# Patient Record
Sex: Male | Born: 1960 | Race: White | Hispanic: No | Marital: Married | Smoking: Never smoker
Health system: Southern US, Community
[De-identification: ages and names within clinical notes are randomized; demographics above are authoritative.]

## PROBLEM LIST (undated history)

## (undated) DIAGNOSIS — E669 Obesity, unspecified: Secondary | ICD-10-CM

## (undated) DIAGNOSIS — R079 Chest pain, unspecified: Secondary | ICD-10-CM

## (undated) DIAGNOSIS — E119 Type 2 diabetes mellitus without complications: Secondary | ICD-10-CM

## (undated) DIAGNOSIS — I10 Essential (primary) hypertension: Secondary | ICD-10-CM

## (undated) HISTORY — PX: EYE SURGERY: SHX253

## (undated) HISTORY — DX: Chest pain, unspecified: R07.9

## (undated) HISTORY — PX: TONSILLECTOMY: SUR1361

---

## 1998-12-12 ENCOUNTER — Encounter: Admission: RE | Admit: 1998-12-12 | Discharge: 1999-03-12 | Payer: Self-pay | Admitting: Emergency Medicine

## 2013-09-26 ENCOUNTER — Emergency Department (HOSPITAL_COMMUNITY): Payer: BC Managed Care – PPO

## 2013-09-26 ENCOUNTER — Emergency Department (HOSPITAL_COMMUNITY)
Admission: EM | Admit: 2013-09-26 | Discharge: 2013-09-27 | Disposition: A | Discharge status: Home / Self Care | Payer: BC Managed Care – PPO | Attending: Emergency Medicine | Admitting: Emergency Medicine

## 2013-09-26 ENCOUNTER — Encounter (HOSPITAL_COMMUNITY): Payer: Self-pay | Admitting: Emergency Medicine

## 2013-09-26 DIAGNOSIS — E669 Obesity, unspecified: Secondary | ICD-10-CM | POA: Insufficient documentation

## 2013-09-26 DIAGNOSIS — M549 Dorsalgia, unspecified: Secondary | ICD-10-CM | POA: Insufficient documentation

## 2013-09-26 DIAGNOSIS — I10 Essential (primary) hypertension: Secondary | ICD-10-CM

## 2013-09-26 DIAGNOSIS — Z79899 Other long term (current) drug therapy: Secondary | ICD-10-CM | POA: Insufficient documentation

## 2013-09-26 DIAGNOSIS — Z7982 Long term (current) use of aspirin: Secondary | ICD-10-CM | POA: Insufficient documentation

## 2013-09-26 HISTORY — DX: Obesity, unspecified: E66.9

## 2013-09-26 HISTORY — DX: Essential (primary) hypertension: I10

## 2013-09-26 LAB — BASIC METABOLIC PANEL
BUN: 9 mg/dL (ref 6–23)
CALCIUM: 9.4 mg/dL (ref 8.4–10.5)
CO2: 26 mEq/L (ref 19–32)
Chloride: 99 mEq/L (ref 96–112)
Creatinine, Ser: 0.52 mg/dL (ref 0.50–1.35)
GFR calc Af Amer: >90 mL/min (ref >90)
GFR calc non Af Amer: >90 mL/min (ref >90)
GLUCOSE: 155 mg/dL — AB (ref 70–99)
Potassium: 4 mEq/L (ref 3.7–5.3)
Sodium: 143 mEq/L (ref 137–147)

## 2013-09-26 LAB — I-STAT TROPONIN, ED
TROPONIN I, POC: 0 ng/mL (ref 0.00–0.08)
Troponin i, poc: 0 ng/mL (ref 0.00–0.08)

## 2013-09-26 LAB — CBC
HCT: 45 % (ref 39.0–52.0)
HEMOGLOBIN: 15.8 g/dL (ref 13.0–17.0)
MCH: 28.9 pg (ref 26.0–34.0)
MCHC: 35.1 g/dL (ref 30.0–36.0)
MCV: 82.4 fL (ref 78.0–100.0)
Platelets: 274 10*3/uL (ref 150–400)
RBC: 5.46 MIL/uL (ref 4.22–5.81)
RDW: 13.2 % (ref 11.5–15.5)
WBC: 11.4 10*3/uL — ABNORMAL HIGH (ref 4.0–10.5)

## 2013-09-26 LAB — URINALYSIS, ROUTINE W REFLEX MICROSCOPIC
Bilirubin Urine: NEGATIVE
Glucose, UA: NEGATIVE mg/dL
Hgb urine dipstick: NEGATIVE
Ketones, ur: NEGATIVE mg/dL
LEUKOCYTES UA: NEGATIVE
Nitrite: NEGATIVE
PROTEIN: NEGATIVE mg/dL
Specific Gravity, Urine: 1.011 (ref 1.005–1.030)
UROBILINOGEN UA: 0.2 mg/dL (ref 0.0–1.0)
pH: 7 (ref 5.0–8.0)

## 2013-09-26 LAB — PRO B NATRIURETIC PEPTIDE: Pro B Natriuretic peptide (BNP): 97.2 pg/mL (ref 0–125)

## 2013-09-26 LAB — D-DIMER, QUANTITATIVE (NOT AT ARMC): D-Dimer, Quant: <0.27 ug/mL-FEU (ref 0.00–0.48)

## 2013-09-26 MED ORDER — IOHEXOL 300 MG/ML  SOLN
20.0000 mL | INTRAMUSCULAR | Status: AC
  Administered 2013-09-26: 20 mL via ORAL

## 2013-09-26 MED ORDER — LABETALOL HCL 5 MG/ML IV SOLN
10.0000 mg | Freq: Once | INTRAVENOUS | Status: AC
  Administered 2013-09-26: 10 mg via INTRAVENOUS
  Filled 2013-09-26: qty 4

## 2013-09-26 MED ORDER — SODIUM CHLORIDE 0.9 % IV BOLUS (SEPSIS)
500.0000 mL | Freq: Once | INTRAVENOUS | Status: AC
  Administered 2013-09-26: 500 mL via INTRAVENOUS

## 2013-09-26 MED ORDER — IOHEXOL 350 MG/ML SOLN
100.0000 mL | Freq: Once | INTRAVENOUS | Status: AC | PRN
  Administered 2013-09-26: 100 mL via INTRAVENOUS

## 2013-09-26 NOTE — ED Notes (Addendum)
Pt reports sob that has been getting progressively worse x 2 days, and is more severe x past 2 days. Also has a pain in mid back, similar to his pleurisy in past. Denies swelling to extremities or cough. No resp distress noted, ekg done at triage. Hypertensive at triage, bp 209/88.

## 2013-09-26 NOTE — ED Provider Notes (Signed)
CSN: [id number]     Arrival date & time 09/26/13  1843 History   First MD Initiated Contact with Patient 09/26/13 1919     Chief Complaint  Patient presents with  . Shortness of Breath  . Back Pain     (Consider location/radiation/quality/duration/timing/severity/associated sxs/prior Treatment) HPI Comments: 53 year old male with history of high blood pressure recently be started on lisinopril, works in [employer] presents with general fatigue and bilateral back discomfort has a mild ache for the past few days. Patient denies any known cardiac history or other significant cardiac risk factors.Patient denies blood clot history, active cancer, recent major trauma or surgery, unilateral leg swelling/ pain, recent long travel, hemoptysis or oral contraceptives. Patient denies injuries or significant leg swelling. The mild ache at times is worse with a deep breath however is just constant. No fevers or chills. Patient is not bleeding from the rectum. No other new medications. Patient denies significant shortness of breath. Back pain mild ache bilateral lower ribs mild worsening with movements however no specific was impacted. Patient has had a few brief episodes of right flank pain the past 2 or 3 days however no pain currently. No urinary symptoms.  Patient is a 53 y.o. male presenting with shortness of breath and back pain. The history is provided by the patient.  Shortness of Breath Associated symptoms: no abdominal pain, no chest pain, no fever, no headaches, no neck pain, no rash and no vomiting   Back Pain Associated symptoms: no abdominal pain, no chest pain, no dysuria, no fever, no headaches, no numbness and no weakness     Past Medical History  Diagnosis Date  . Obesity   . Hypertension    History reviewed. No pertinent past surgical history. History reviewed. No pertinent family history. History  Substance Use Topics  . Smoking status: Not on file  . Smokeless  tobacco: Not on file  . Alcohol Use: No    Review of Systems  Constitutional: Positive for fatigue. Negative for fever and chills.  HENT: Negative for congestion.   Eyes: Negative for visual disturbance.  Respiratory: Negative for shortness of breath.   Cardiovascular: Negative for chest pain.  Gastrointestinal: Negative for vomiting and abdominal pain.  Genitourinary: Negative for dysuria and flank pain.  Musculoskeletal: Positive for back pain. Negative for neck pain and neck stiffness.  Skin: Negative for rash.  Neurological: Negative for weakness, light-headedness, numbness and headaches.      Allergies  Review of patient's allergies indicates no known allergies.  Home Medications   Prior to Admission medications   Medication Sig Start Date End Date Taking? Authorizing Provider  aspirin EC 81 MG tablet Take 81 mg by mouth daily.   Yes Historical Provider, MD  b complex vitamins tablet Take 1 tablet by mouth daily.   Yes Historical Provider, MD  CINNAMON PO Take 1 capsule by mouth daily.   Yes Historical Provider, MD  Coenzyme Q10 (COQ10) 100 MG CAPS Take 100 mg by mouth daily.   Yes Historical Provider, MD  lisinopril (PRINIVIL,ZESTRIL) 20 MG tablet Take 20 mg by mouth daily.   Yes Historical Provider, MD  Omega-3 Fatty Acids (FISH OIL) 1200 MG CAPS Take 2,400 mg by mouth daily.    Yes Historical Provider, MD  Red Yeast Rice 600 MG CAPS Take 600 mg by mouth daily.   Yes Historical Provider, MD   BP 175/84  Pulse 70  Temp(Src) 98.1 F (36.7 C) (Oral)  Resp 20  SpO2 97%  Physical Exam  Nursing note and vitals reviewed. Constitutional: He is oriented to person, place, and time. He appears well-developed and well-nourished.  HENT:  Head: Normocephalic and atraumatic.  Eyes: Conjunctivae are normal. Right eye exhibits no discharge. Left eye exhibits no discharge.  Neck: Normal range of motion. Neck supple. No tracheal deviation present.  Cardiovascular: Normal rate and  regular rhythm.   Pulmonary/Chest: Effort normal and breath sounds normal.  Abdominal: Soft. He exhibits no distension. There is no tenderness. There is no guarding.  Musculoskeletal: He exhibits no edema.  Neurological: He is alert and oriented to person, place, and time. No cranial nerve deficit or sensory deficit. He exhibits normal muscle tone. Coordination normal. GCS eye subscore is 4. GCS verbal subscore is 5. GCS motor subscore is 6.  Skin: Skin is warm. No rash noted.  Psychiatric: He has a normal mood and affect.    ED Course  Procedures (including critical care time) Emergency Focused Ultrasound Exam Limited retroperitoneal ultrasound of kidneys  Performed and interpreted by Dr. Reather Converse Indication: flank pain Focused abdominal ultrasound with both kidneys imaged in transverse and longitudinal planes in real-time. Interpretation: No hydronephrosis visualized.   Images archived electronically   [place] "Study: Limited Abdominal Ultrasound of the gallbladder and common bile duct."  INDICATIONS: RUQ pain and Back pain Indication: Multiple views of the gallbladder and common bile duct were obtained in real-time with a Multi-frequency probe." PERFORMED BY:  Myself IMAGES ARCHIVED?: Yes FINDINGS: Gallstones absent, Gallbladder wall normal in thickness and Sonographic Murphy's sign absent LIMITATIONS: Body Habitus and Bowel Gas INTERPRETATION: Normal   Labs Review Labs Reviewed  CBC - Abnormal; Notable for the following:    WBC 11.4 (*)    All other components within normal limits  BASIC METABOLIC PANEL - Abnormal; Notable for the following:    Glucose, Bld 155 (*)    All other components within normal limits  PRO B NATRIURETIC PEPTIDE  D-DIMER, QUANTITATIVE  URINALYSIS, ROUTINE W REFLEX MICROSCOPIC  I-STAT TROPOININ, ED    Imaging Review Dg Chest 2 View  09/26/2013   CLINICAL DATA:  Right lower quadrant pain for 5 days.  Pleuritic chest pain. Pleurisy. Short of breath. Back pain.  EXAM: CHEST  2 VIEW  COMPARISON:  None.  FINDINGS: Cardiopericardial silhouette within normal limits. Mediastinal contours normal. Trachea midline. No airspace disease or effusion.  IMPRESSION: No active cardiopulmonary disease.   Electronically Signed   By: Dereck Ligas M.D.   On: 09/26/2013 20:22   Ct Angio Chest Aortic Dissect W &/or W/o  09/26/2013   CLINICAL DATA:  Short of breath. Progressive worsening over the last 2 days. Mid back pain. Pleurisy.  EXAM: CT ANGIOGRAPHY CHEST, ABDOMEN AND PELVIS  TECHNIQUE: Multidetector CT imaging through the chest, abdomen and pelvis was performed using the standard protocol during bolus administration of intravenous contrast. Multiplanar reconstructed images and MIPs were obtained and reviewed to evaluate the vascular anatomy.  CONTRAST:  116mL OMNIPAQUE IOHEXOL 350 MG/ML SOLN  COMPARISON:  None.  FINDINGS: CTA CHEST FINDINGS  Noncontrast CT of the chest shows minimal calcification at the ligamentum arteriosum. No intramural hematomas present. Negative for pulmonary embolism or acute aortic abnormality in the chest. Heart appears normal. No effusion. No adenopathy. Central airways are patent. Calcification is present in the left posterior chest wall, compatible with a calcified dermal lesion, likely sebaceous cyst the. No aggressive osseous lesions. Thoracic spine DISH. Lungs are clear. No airspace disease.  Review of the  MIP images confirms the above findings.  CTA ABDOMEN AND PELVIS FINDINGS  Aortoiliac system is within normal limits. Small and large bowel appears within normal limits. Celiac axis, superior and inferior mesenteric arteries appear widely patent. Renal arteries appear normal. There are 3 renal arteries each with separate origins from the aorta on the right. Left renal artery is simplex. Adrenal glands within normal limits. Pancreas and common bile duct within normal limits. Left upper poles  simple renal cysts. Probable small right interpolar renal cyst. Normal appendix. Urinary bladder appears normal. Prostatomegaly with 7.5 transverse prostate span. Mild bilateral hip osteoarthritis. L5-S1 predominant lumbar spondylosis.  Review of the MIP images confirms the above findings.  IMPRESSION: Negative CTA chest abdomen and pelvis. Incidental note made of left renal cysts.   Electronically Signed   By: Dereck Ligas M.D.   On: 09/26/2013 23:02   Ct Cta Abd/pel W/cm &/or W/o Cm  09/26/2013   CLINICAL DATA:  Short of breath. Progressive worsening over the last 2 days. Mid back pain. Pleurisy.  EXAM: CT ANGIOGRAPHY CHEST, ABDOMEN AND PELVIS  TECHNIQUE: Multidetector CT imaging through the chest, abdomen and pelvis was performed using the standard protocol during bolus administration of intravenous contrast. Multiplanar reconstructed images and MIPs were obtained and reviewed to evaluate the vascular anatomy.  CONTRAST:  11mL OMNIPAQUE IOHEXOL 350 MG/ML SOLN  COMPARISON:  None.  FINDINGS: CTA CHEST FINDINGS  Noncontrast CT of the chest shows minimal calcification at the ligamentum arteriosum. No intramural hematomas present. Negative for pulmonary embolism or acute aortic abnormality in the chest. Heart appears normal. No effusion. No adenopathy. Central airways are patent. Calcification is present in the left posterior chest wall, compatible with a calcified dermal lesion, likely sebaceous cyst the. No aggressive osseous lesions. Thoracic spine DISH. Lungs are clear. No airspace disease.  Review of the MIP images confirms the above findings.  CTA ABDOMEN AND PELVIS FINDINGS  Aortoiliac system is within normal limits. Small and large bowel appears within normal limits. Celiac axis, superior and inferior mesenteric arteries appear widely patent. Renal arteries appear normal. There are 3 renal arteries each with separate origins from the aorta on the right. Left renal artery is simplex. Adrenal glands  within normal limits. Pancreas and common bile duct within normal limits. Left upper poles simple renal cysts. Probable small right interpolar renal cyst. Normal appendix. Urinary bladder appears normal. Prostatomegaly with 7.5 transverse prostate span. Mild bilateral hip osteoarthritis. L5-S1 predominant lumbar spondylosis.  Review of the MIP images confirms the above findings.  IMPRESSION: Negative CTA chest abdomen and pelvis. Incidental note made of left renal cysts.   Electronically Signed   By: Dereck Ligas M.D.   On: 09/26/2013 23:02     EKG Interpretation   Date/Time:  Sunday September 26 2013 18:48:42 EDT Ventricular Rate:  89 PR Interval:  146 QRS Duration: 108 QT Interval:  372 QTC Calculation: 452 R Axis:   123 Text Interpretation:  Normal sinus rhythm with sinus arrhythmia Incomplete  right bundle branch block Left posterior fascicular block Abnormal ECG  Confirmed by Britaney Espaillat  MD, Gizzelle Lacomb ([id number]) on 09/26/2013 9:22:29 PM      MDM   Final diagnoses:  HTN (hypertension)  Back pain  RBBB  Patient presents with nonspecific symptoms, nonfocal and primary concern is general fatigue with mild back ache. He has no focal tenosynovitis and no midline tenderness, no fevers and normal neurologic exam. Discussed broad differential and plan for screening for cardiac, blood clots, urine infection, kidney  stone and basic labs. Blood pressure improved without significant treatment, labetalol IV ordered. If everything is unremarkable plantar close followup with primary doctor as patient currently is no chest pain or shortness of breath.  With back pain and uncontrolled blood pressure and no injuries reproducibility on exam plantar CT angiogram of dissection. CT done in no acute dissection or acute findings. Patient improved and vitals improved in ER. Small vessel available given. Patient will follow closely with his doctor for blood pressure management. Results and differential diagnosis were  discussed with the patient/parent/guardian. Close follow up outpatient was discussed, comfortable with the plan.   Medications  iohexol (OMNIPAQUE) 300 MG/ML solution 20 mL (20 mLs Oral Contrast Given 09/26/13 1956)  labetalol (NORMODYNE,TRANDATE) injection 10 mg (not administered)  sodium chloride 0.9 % bolus 500 mL (500 mLs Intravenous New Bag/Given 09/26/13 1949)    Filed Vitals:   09/26/13 1950 09/26/13 2141 09/26/13 2145 09/26/13 2200  BP: 175/84 204/98 180/95 156/81  Pulse: 70 81 79 76  Temp:  98.6 F (37 C)    TempSrc:  Oral    Resp: 20 16 21 18   SpO2: 97% 97% 97% 96%         Mariea Clonts, MD 09/26/13 2328

## 2013-09-27 NOTE — ED Notes (Signed)
Pt ambulating independently w/ steady gait on d/c in no acute distress, A&Ox4.D/c instructions reviewed w/ pt and family - pt and family deny any further questions or concerns at present.  

## 2013-09-27 NOTE — Discharge Instructions (Signed)
If you were given medicines take as directed.  If you are on coumadin or contraceptives realize their levels and effectiveness is altered by many different medicines.  If you have any reaction (rash, tongues swelling, other) to the medicines stop taking and see a physician.   Please follow up as directed and return to the ER or see a physician for new or worsening symptoms.  Thank you. Filed Vitals:   09/26/13 1950 09/26/13 2141 09/26/13 2145 09/26/13 2200  BP: 175/84 204/98 180/95 156/81  Pulse: 70 81 79 76  Temp:  98.6 F (37 C)    TempSrc:  Oral    Resp: 20 16 21 18   SpO2: 97% 97% 97% 96%    Arterial Hypertension Arterial hypertension (high blood pressure) is a condition of elevated pressure in your blood vessels. Hypertension over a long period of time is a risk factor for strokes, heart attacks, and heart failure. It is also the leading cause of kidney (renal) failure.  CAUSES   In Adults -- Over 90% of all hypertension has no known cause. This is called essential or primary hypertension. In the other 10% of people with hypertension, the increase in blood pressure is caused by another disorder. This is called secondary hypertension. Important causes of secondary hypertension are:  Heavy alcohol use.  Obstructive sleep apnea.  Hyperaldosterosim (Conn's syndrome).  Steroid use.  Chronic kidney failure.  Hyperparathyroidism.  Medications.  Renal artery stenosis.  Pheochromocytoma.  Cushing's disease.  Coarctation of the aorta.  Scleroderma renal crisis.  Licorice (in excessive amounts).  Drugs (cocaine, methamphetamine). Your caregiver can explain any items above that apply to you.  In Children -- Secondary hypertension is more common and should always be considered.  Pregnancy -- Few women of childbearing age have high blood pressure. However, up to 10% of them develop hypertension of pregnancy. Generally, this will not harm the woman. It may be a sign of 3  complications of pregnancy: preeclampsia, HELLP syndrome, and eclampsia. Follow up and control with medication is necessary. SYMPTOMS   This condition normally does not produce any noticeable symptoms. It is usually found during a routine exam.  Malignant hypertension is a late problem of high blood pressure. It may have the following symptoms:  Headaches.  Blurred vision.  End-organ damage (this means your kidneys, heart, lungs, and other organs are being damaged).  Stressful situations can increase the blood pressure. If a person with normal blood pressure has their blood pressure go up while being seen by their caregiver, this is often termed "white coat hypertension." Its importance is not known. It may be related with eventually developing hypertension or complications of hypertension.  Hypertension is often confused with mental tension, stress, and anxiety. DIAGNOSIS  The diagnosis is made by 3 separate blood pressure measurements. They are taken at least 1 week apart from each other. If there is organ damage from hypertension, the diagnosis may be made without repeat measurements. Hypertension is usually identified by having blood pressure readings:  Above 140/90 mmHg measured in both arms, at 3 separate times, over a couple weeks.  Over 130/80 mmHg should be considered a risk factor and may require treatment in patients with diabetes. Blood pressure readings over 120/80 mmHg are called "pre-hypertension" even in non-diabetic patients. To get a true blood pressure measurement, use the following guidelines. Be aware of the factors that can alter blood pressure readings.  Take measurements at least 1 hour after caffeine.  Take measurements 30 minutes after smoking  and without any stress. This is another reason to quit smoking  it raises your blood pressure.  Use a proper cuff size. Ask your caregiver if you are not sure about your cuff size.  Most home blood pressure cuffs are  automatic. They will measure systolic and diastolic pressures. The systolic pressure is the pressure reading at the start of sounds. Diastolic pressure is the pressure at which the sounds disappear. If you are elderly, measure pressures in multiple postures. Try sitting, lying or standing.  Sit at rest for a minimum of 5 minutes before taking measurements.  You should not be on any medications like decongestants. These are found in many cold medications.  Record your blood pressure readings and review them with your caregiver. If you have hypertension:  Your caregiver may do tests to be sure you do not have secondary hypertension (see "causes" above).  Your caregiver may also look for signs of metabolic syndrome. This is also called Syndrome X or Insulin Resistance Syndrome. You may have this syndrome if you have type 2 diabetes, abdominal obesity, and abnormal blood lipids in addition to hypertension.  Your caregiver will take your medical and family history and perform a physical exam.  Diagnostic tests may include blood tests (for glucose, cholesterol, potassium, and kidney function), a urinalysis, or an EKG. Other tests may also be necessary depending on your condition. PREVENTION  There are important lifestyle issues that you can adopt to reduce your chance of developing hypertension:  Maintain a normal weight.  Limit the amount of salt (sodium) in your diet.  Exercise often.  Limit alcohol intake.  Get enough potassium in your diet. Discuss specific advice with your caregiver.  Follow a DASH diet (dietary approaches to stop hypertension). This diet is rich in fruits, vegetables, and low-fat dairy products, and avoids certain fats. PROGNOSIS  Essential hypertension cannot be cured. Lifestyle changes and medical treatment can lower blood pressure and reduce complications. The prognosis of secondary hypertension depends on the underlying cause. Many people whose hypertension is  controlled with medicine or lifestyle changes can live a normal, healthy life.  RISKS AND COMPLICATIONS  While high blood pressure alone is not an illness, it often requires treatment due to its short- and long-term effects on many organs. Hypertension increases your risk for:  CVAs or strokes (cerebrovascular accident).  Heart failure due to chronically high blood pressure (hypertensive cardiomyopathy).  Heart attack (myocardial infarction).  Damage to the retina (hypertensive retinopathy).  Kidney failure (hypertensive nephropathy). Your caregiver can explain list items above that apply to you. Treatment of hypertension can significantly reduce the risk of complications. TREATMENT   For overweight patients, weight loss and regular exercise are recommended. Physical fitness lowers blood pressure.  Mild hypertension is usually treated with diet and exercise. A diet rich in fruits and vegetables, fat-free dairy products, and foods low in fat and salt (sodium) can help lower blood pressure. Decreasing salt intake decreases blood pressure in a 1/3 of people.  Stop smoking if you are a smoker. The steps above are highly effective in reducing blood pressure. While these actions are easy to suggest, they are difficult to achieve. Most patients with moderate or severe hypertension end up requiring medications to bring their blood pressure down to a normal level. There are several classes of medications for treatment. Blood pressure pills (antihypertensives) will lower blood pressure by their different actions. Lowering the blood pressure by 10 mmHg may decrease the risk of complications by as much as  25%. The goal of treatment is effective blood pressure control. This will reduce your risk for complications. Your caregiver will help you determine the best treatment for you according to your lifestyle. What is excellent treatment for one person, may not be for you. HOME CARE INSTRUCTIONS   Do not  smoke.  Follow the lifestyle changes outlined in the "Prevention" section.  If you are on medications, follow the directions carefully. Blood pressure medications must be taken as prescribed. Skipping doses reduces their benefit. It also puts you at risk for problems.  Follow up with your caregiver, as directed.  If you are asked to monitor your blood pressure at home, follow the guidelines in the "Diagnosis" section above. SEEK MEDICAL CARE IF:   You think you are having medication side effects.  You have recurrent headaches or lightheadedness.  You have swelling in your ankles.  You have trouble with your vision. SEEK IMMEDIATE MEDICAL CARE IF:   You have sudden onset of chest pain or pressure, difficulty breathing, or other symptoms of a heart attack.  You have a severe headache.  You have symptoms of a stroke (such as sudden weakness, difficulty speaking, difficulty walking). MAKE SURE YOU:   Understand these instructions.  Will watch your condition.  Will get help right away if you are not doing well or get worse. Document Released: 04/01/2005 Document Revised: 06/24/2011 Document Reviewed: 10/30/2006 [facility] Patient Information 2014 [place].

## 2013-09-30 ENCOUNTER — Encounter: Payer: Self-pay | Admitting: Cardiology

## 2013-09-30 ENCOUNTER — Ambulatory Visit (INDEPENDENT_AMBULATORY_CARE_PROVIDER_SITE_OTHER): Payer: BC Managed Care – PPO | Admitting: Cardiology

## 2013-09-30 VITALS — BP 178/98 | HR 86 | Ht 72.0 in | Wt 279.0 lb

## 2013-09-30 DIAGNOSIS — E669 Obesity, unspecified: Secondary | ICD-10-CM

## 2013-09-30 DIAGNOSIS — R079 Chest pain, unspecified: Secondary | ICD-10-CM

## 2013-09-30 DIAGNOSIS — I10 Essential (primary) hypertension: Secondary | ICD-10-CM

## 2013-09-30 DIAGNOSIS — E119 Type 2 diabetes mellitus without complications: Secondary | ICD-10-CM

## 2013-09-30 MED ORDER — LISINOPRIL-HYDROCHLOROTHIAZIDE 20-12.5 MG PO TABS: 1.0000 | ORAL_TABLET | Freq: Every day | ORAL | Status: DC

## 2013-09-30 NOTE — Patient Instructions (Signed)
Your physician has requested that you have en exercise stress myoview. For further information please visit [email or web address]. Please follow instruction sheet, as given.  Your physician has recommended you make the following change in your medication:  1. STOP LISINOPRIL 2. START LISINOPRIL/HCTZ 20/12.25 MG 1 TAB DAILY  Your physician recommends that you schedule a follow-up appointment AS NEEDED

## 2013-09-30 NOTE — Progress Notes (Signed)
[facility] [address], [place]  [address] Phone: [phone] Fax:  [phone]  Date:  09/30/2013   ID:  Neil Berg, DOB Sep 30, 1960, MRN [id number]  PCP:  Hulen Shouts, MD   History of Present Illness: Neil Berg is a 53 y.o. male here for the evaluation of chest pain. Has risk factors of diabetes, hypertension, obesity, nonsmoker. He went to the emergency room on 09/26/13 with chief complaint of back pain and shortness of breath. He was previously started on lisinopril. He stated that he had discomfort along his left lateral breast with associated left arm numbness and jaw pain. This was concerning to him. Earlier today he had a constant appearing left breast pain.  While in the emergency department he underwent a CT angiogram of the chest which was normal. No calcification of the coronary arteries. He also had a CT angiogram of the abdomen and pelvis as well. No evidence of aortic dissection.  He has recently been battling with hypertension. Several months ago his blood pressure was 170 at the right age. Now he is currently in the 170 range. He has recently restarted lisinopril.  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  Wt Readings from Last 3 Encounters:  09/30/13 279 lb (126.554 kg)     Past Medical History  Diagnosis Date  . Obesity   . Hypertension     No past surgical history on file.  Current Outpatient Prescriptions  Medication Sig Dispense Refill  . amLODipine (NORVASC) 5 MG tablet Take 5 mg by mouth daily.      Marland Kitchen aspirin EC 81 MG tablet Take 81 mg by mouth daily.      Marland Kitchen b complex vitamins tablet Take 1 tablet by mouth daily.      Marland Kitchen CINNAMON PO Take 1 capsule by mouth daily.      . Coenzyme Q10 (COQ10) 100 MG CAPS Take 100 mg by mouth daily.      Marland Kitchen lisinopril (PRINIVIL,ZESTRIL) 20 MG tablet Take 20 mg by mouth daily.      . metFORMIN (GLUCOPHAGE-XR) 500 MG 24 hr tablet Take 500 mg by mouth daily. 2 tablets  once a day.      . Omega-3 Fatty Acids (FISH OIL) 1200 MG CAPS Take 2,400 mg by mouth daily.       . Red Yeast Rice 600 MG CAPS Take 600 mg by mouth daily.       No current facility-administered medications for this visit.    Allergies:   No Known Allergies  Social History:  The patient  reports that he has never smoked. He does not have any smokeless tobacco history on file. He reports that he does not drink alcohol or use illicit drugs.   Family History  Problem Relation Age of Onset  . Cancer Mother   . Cancer Mother   . Cancer Mother   . Cancer Mother     ROS:  Please see the history of present illness.   Denies any fevers, chills, orthopnea, PND, syncope, bleeding, rashes. Positive for chest pain, occasional jaw/left arm numbness, back pain as described above.   All other systems reviewed and negative.   PHYSICAL EXAM: VS:  BP 178/98  Pulse 86  Ht 6' (1.829 m)  Wt 279 lb (126.554 kg)  BMI 37.83 kg/m2 Well nourished, well developed, in no acute distress HEENT: normal, [place]/AT, EOMI Neck: no JVD, normal carotid upstroke, no bruit Cardiac:  normal S1, S2; RRR; no murmur Lungs:  clear to auscultation bilaterally, no wheezing, rhonchi or rales Abd: soft, nontender, no hepatomegaly, no bruits Ext: no edema, 2+ distal pulses Skin: warm and dry GU: deferred Neuro: no focal abnormalities noted, AAO x 3  EKG:  09/30/13-incomplete right bundle branch block, QRS duration 112 ms  ASSESSMENT AND PLAN:  1. Chest pain- given his multiple risk factors including diabetes, coronary artery disease equivalent, we will go ahead and order a nuclear stress test. 2. Hypertension stash uncontrolled. I am going to add lisinopril 20/HCTZ 12.5 to the regimen. He had been on this medication in the past. He has followup in approximately one month with his primary physician to further evaluate and discuss hypertension. 3. Diabetes-continue to encourage weight loss. 4. Obesity-excellent job at 50 pound  weight loss.  Signed, Candee Furbish, MD [facility]  09/30/2013 3:38 PM

## 2013-10-12 ENCOUNTER — Ambulatory Visit (HOSPITAL_COMMUNITY): Payer: BC Managed Care – PPO | Attending: Cardiology | Admitting: Radiology

## 2013-10-12 VITALS — BP 143/78 | Ht 72.0 in | Wt 277.0 lb

## 2013-10-12 DIAGNOSIS — I10 Essential (primary) hypertension: Secondary | ICD-10-CM

## 2013-10-12 DIAGNOSIS — R0602 Shortness of breath: Secondary | ICD-10-CM | POA: Insufficient documentation

## 2013-10-12 DIAGNOSIS — R079 Chest pain, unspecified: Secondary | ICD-10-CM | POA: Insufficient documentation

## 2013-10-12 DIAGNOSIS — R002 Palpitations: Secondary | ICD-10-CM | POA: Insufficient documentation

## 2013-10-12 MED ORDER — TECHNETIUM TC 99M SESTAMIBI GENERIC - CARDIOLITE
30.0000 | Freq: Once | INTRAVENOUS | Status: AC | PRN
  Administered 2013-10-12: 30 via INTRAVENOUS

## 2013-10-12 MED ORDER — TECHNETIUM TC 99M SESTAMIBI GENERIC - CARDIOLITE
10.0000 | Freq: Once | INTRAVENOUS | Status: AC | PRN
  Administered 2013-10-12: 10 via INTRAVENOUS

## 2013-10-12 NOTE — Progress Notes (Signed)
[facility] [address] [place], [place] [address] [phone]    Cardiology Nuclear Med Study  Neil Berg is a 53 y.o. male     MRN : [id number]     DOB: Sep 06, 1960  Procedure Date: 10/12/2013  Nuclear Med Background Indication for Stress Test:  Evaluation for Ischemia, and Patient seen in hospital on 09-26-2013 for Dyspnea, Back Pain, Enzymes negative History:No Known History of CAD;Cardiac CT (Nml 6/15) Cardiac Risk Factors: Hypertension, Lipids and NIDDM  Symptoms:  Chest Pain, Palpitations and SOB   Nuclear Pre-Procedure Caffeine/Decaff Intake:  None> 12 hrs NPO After: 9:00pm   Lungs:  clear O2 Sat: 95% on room air. IV 0.9% NS with Angio Cath:  22g  IV Site: R Antecubital x 1, tolerated well IV Started by:  Irven Baltimore, RN  Chest Size (in):  50 Cup Size: n/a  Height: 6' (1.829 m)  Weight:  277 lb (125.646 kg)  BMI:  Body mass index is 37.56 kg/(m^2). Tech Comments:  Patient took Prinizide this am, and Metformin last night. Irven Baltimore, RN.    Nuclear Med Study 1 or 2 day study: 1 day  Stress Test Type:  Stress  Reading MD: N/A  Order Authorizing Provider:  Candee Furbish, MD  Resting Radionuclide: Technetium 38m Sestamibi  Resting Radionuclide Dose: 11.0 mCi   Stress Radionuclide:  Technetium 53m Sestamibi  Stress Radionuclide Dose: 33.0 mCi           Stress Protocol Rest HR: 143/78 Stress HR: 157  Rest BP: 143/78 Stress BP: 184/54  Exercise Time (min): 8:00 METS: 10.40   Predicted Max HR: 169 bpm % Max HR: 92.9 bpm Rate Pressure Product: [phone]   Dose of Adenosine (mg):  n/a Dose of Lexiscan: n/a mg  Dose of Atropine (mg): n/a Dose of Dobutamine: n/a mcg/kg/min (at max HR)  Stress Test Technologist: Perrin Maltese, EMT-P  Nuclear Technologist:  Charlton Amor, CNMT     Rest Procedure:  Myocardial perfusion imaging was performed at rest 45 minutes following the intravenous administration of Technetium 28m  Sestamibi. Rest ECG: NSR, iRBBB  Stress Procedure:  The patient exercised on the treadmill utilizing the Bruce Protocol for 8:00 minutes. The patient stopped due to fatigue and expierenced  chest numbness,  Technetium 27m Sestamibi was injected at peak exercise and myocardial perfusion imaging was performed after a brief delay. Stress ECG: No significant change from baseline ECG, PVCs  QPS Raw Data Images:  There is interference from nuclear activity from structures below the diaphragm. This does not affect the ability to read the study. Stress Images:  There is decreased uptake in the basal and mid inferior and inferolateral walls.  Rest Images:  There is decreased uptake in the basal and mid inferior and inferolateral walls.  Subtraction (SDS):  No evidence of ischemia. Transient Ischemic Dilatation (Normal <1.22):  0.84 Lung/Heart Ratio (Normal <0.45):  0.24  Quantitative Gated Spect Images QGS EDV:  149 ml QGS ESV:  71 ml  Impression Exercise Capacity:  Good exercise capacity. BP Response:  Normal blood pressure response. Clinical Symptoms:  No significant symptoms noted. ECG Impression:  No significant ST segment change suggestive of ischemia. Comparison with Prior Nuclear Study: No previous nuclear study performed  Overall Impression:  Low risk stress nuclear study with fixed defect in the basal and mid inferior and inferolateral wall that might be a scar or an artifact due to extracardiac activity. No ischemia. .  LV Ejection Fraction: 53%.  LV Wall Motion:  Hypokinesis in the basal and mid inferior and inferolateral walls.   Dorothy Spark 10/12/2013

## 2013-10-22 ENCOUNTER — Telehealth: Payer: Self-pay | Admitting: Cardiology

## 2013-10-22 NOTE — Telephone Encounter (Signed)
New message ° ° ° ° °Want stress test results °

## 2013-10-22 NOTE — Telephone Encounter (Signed)
Pt called for stress test results. Pt is aware that DR. Skains did not received the report to review, once Md gets all the documentation needed to reviewe; we will call him back when he does. Pt verbalized understanding.

## 2013-10-25 NOTE — Telephone Encounter (Signed)
Follow up ° ° ° ° °Want stress test results °

## 2013-10-25 NOTE — Telephone Encounter (Signed)
I spoke with the patient and made his aware we are still waiting for Dr. Marlou Porch to read the final report. I did give him a preliminary reading and advised we would call him back with the final report once Dr. Marlou Porch reviews. He is agreeable.

## 2013-10-26 NOTE — Telephone Encounter (Signed)
Reassuring NUC stress with no ischemia. Great.

## 2013-10-27 NOTE — Telephone Encounter (Signed)
Pt aware of results 

## 2014-03-23 ENCOUNTER — Other Ambulatory Visit: Payer: Self-pay | Admitting: Cardiology

## 2014-06-24 ENCOUNTER — Other Ambulatory Visit: Payer: Self-pay

## 2014-06-24 MED ORDER — AMLODIPINE BESYLATE 5 MG PO TABS: 5.0000 mg | ORAL_TABLET | Freq: Every day | ORAL | Status: AC

## 2015-03-17 ENCOUNTER — Other Ambulatory Visit: Payer: Self-pay | Admitting: Cardiology

## 2015-03-17 NOTE — Telephone Encounter (Signed)
Should this be deferred to pcp as patient is prn follow up with Dr Marlou Porch?

## 2015-06-23 ENCOUNTER — Other Ambulatory Visit: Payer: Self-pay | Admitting: *Deleted

## 2015-06-23 MED ORDER — LISINOPRIL-HYDROCHLOROTHIAZIDE 20-12.5 MG PO TABS: 1.0000 | ORAL_TABLET | Freq: Every day | ORAL | Status: DC

## 2017-04-18 DIAGNOSIS — H40013 Open angle with borderline findings, low risk, bilateral: Secondary | ICD-10-CM | POA: Diagnosis not present

## 2017-07-14 DIAGNOSIS — R972 Elevated prostate specific antigen [PSA]: Secondary | ICD-10-CM | POA: Diagnosis not present

## 2017-08-11 DIAGNOSIS — R972 Elevated prostate specific antigen [PSA]: Secondary | ICD-10-CM | POA: Diagnosis not present

## 2017-08-11 DIAGNOSIS — N5201 Erectile dysfunction due to arterial insufficiency: Secondary | ICD-10-CM | POA: Diagnosis not present

## 2017-10-14 ENCOUNTER — Encounter: Payer: Self-pay | Admitting: Urgent Care

## 2017-10-14 ENCOUNTER — Ambulatory Visit (HOSPITAL_COMMUNITY)
Admission: RE | Admit: 2017-10-14 | Discharge: 2017-10-14 | Disposition: A | Discharge status: Home / Self Care | Payer: BLUE CROSS/BLUE SHIELD | Source: Ambulatory Visit | Attending: Urgent Care | Admitting: Urgent Care

## 2017-10-14 ENCOUNTER — Other Ambulatory Visit: Payer: Self-pay

## 2017-10-14 ENCOUNTER — Ambulatory Visit: Payer: BLUE CROSS/BLUE SHIELD | Admitting: Urgent Care

## 2017-10-14 VITALS — BP 166/82 | HR 86 | Temp 98.1°F | Ht 72.0 in | Wt 283.4 lb

## 2017-10-14 DIAGNOSIS — N400 Enlarged prostate without lower urinary tract symptoms: Secondary | ICD-10-CM | POA: Diagnosis not present

## 2017-10-14 DIAGNOSIS — I700 Atherosclerosis of aorta: Secondary | ICD-10-CM | POA: Insufficient documentation

## 2017-10-14 DIAGNOSIS — R1084 Generalized abdominal pain: Secondary | ICD-10-CM | POA: Insufficient documentation

## 2017-10-14 DIAGNOSIS — R590 Localized enlarged lymph nodes: Secondary | ICD-10-CM | POA: Insufficient documentation

## 2017-10-14 DIAGNOSIS — R109 Unspecified abdominal pain: Secondary | ICD-10-CM | POA: Diagnosis not present

## 2017-10-14 LAB — POCT I-STAT CREATININE: CREATININE: 0.5 mg/dL — AB (ref 0.61–1.24)

## 2017-10-14 LAB — POCT URINALYSIS DIP (MANUAL ENTRY)
Bilirubin, UA: NEGATIVE
Blood, UA: NEGATIVE
Glucose, UA: NEGATIVE mg/dL
Leukocytes, UA: NEGATIVE
NITRITE UA: NEGATIVE
PH UA: 6 (ref 5.0–8.0)
PROTEIN UA: NEGATIVE mg/dL
Spec Grav, UA: 1.02 (ref 1.010–1.025)
UROBILINOGEN UA: 0.2 U/dL

## 2017-10-14 LAB — GLUCOSE, POCT (MANUAL RESULT ENTRY): POC GLUCOSE: 145 mg/dL — AB (ref 70–99)

## 2017-10-14 MED ORDER — IOPAMIDOL (ISOVUE-300) INJECTION 61%
100.0000 mL | Freq: Once | INTRAVENOUS | Status: AC | PRN
  Administered 2017-10-14: 100 mL via INTRAVENOUS

## 2017-10-14 MED ORDER — IOPAMIDOL (ISOVUE-300) INJECTION 61%
INTRAVENOUS | Status: AC
  Filled 2017-10-14: qty 100

## 2017-10-14 NOTE — Addendum Note (Signed)
Addended by: Suszanne Finch on: 10/14/2017 04:16 PM   Modules accepted: Orders

## 2017-10-14 NOTE — Progress Notes (Addendum)
    MRN: [id number] DOB: 04-14-1961  Subjective:   Karl Erway is a 57 y.o. male presenting for acute onset of severe, constant, sharp abdominal pain.  Symptoms initially started over left flank and have spread across his lower abdomen now worse over mid to right side.  Patient has nausea without vomiting.  Denies fever, bloody stools, dysuria, hematuria, urinary frequency.  Patient hydrates very well every day with at least 100 ounces of water.  Denies a history of kidney stones.  He is diabetic, currently managed with Jardiance.  He also has hypertension managed with lisinopril hydrochlorothiazide.  Denies smoking history.  Xayden has a current medication list which includes the following prescription(s): amlodipine, aspirin ec, b complex vitamins, jardiance, and lisinopril-hydrochlorothiazide. Also has No Known Allergies.  [place]  has a past medical history of Chest pain, Hypertension, and Obesity.  Denies past surgical history.  Objective:   Vitals: BP (!) 166/82 (BP Location: Left Arm, Patient Position: Sitting, Cuff Size: Normal)   Pulse 86   Temp 98.1 F (36.7 C) (Oral)   Ht 6' (1.829 m)   Wt 283 lb 6.4 oz (128.5 kg)   SpO2 97%   BMI 38.44 kg/m   Physical Exam  Constitutional: He is oriented to person, place, and time. He appears well-developed and well-nourished.  Cardiovascular: Normal rate, regular rhythm and intact distal pulses. Exam reveals no gallop and no friction rub.  No murmur heard. Pulmonary/Chest: No respiratory distress. He has no wheezes. He has no rales.  Abdominal: Bowel sounds are normal. There is no hepatosplenomegaly. There is generalized tenderness and tenderness in the right upper quadrant, periumbilical area and left lower quadrant. There is guarding. There is no rigidity and no rebound.  Neurological: He is alert and oriented to person, place, and time.  Skin: Skin is warm and dry.  Psychiatric: He has a normal mood and affect.   Results for orders  placed or performed in visit on 10/14/17 (from the past 24 hour(s))  POCT urinalysis dipstick     Status: Abnormal   Collection Time: 10/14/17  3:07 PM  Result Value Ref Range   Color, UA yellow yellow   Clarity, UA clear clear   Glucose, UA negative negative mg/dL   Bilirubin, UA negative negative   Ketones, POC UA small (15) (A) negative mg/dL   Spec Grav, UA 1.020 1.010 - 1.025   Blood, UA negative negative   pH, UA 6.0 5.0 - 8.0   Protein Ur, POC negative negative mg/dL   Urobilinogen, UA 0.2 0.2 or 1.0 E.U./dL   Nitrite, UA Negative Negative   Leukocytes, UA Negative Negative  POCT glucose (manual entry)     Status: Abnormal   Collection Time: 10/14/17  3:23 PM  Result Value Ref Range   POC Glucose 145 (A) 70 - 99 mg/dl   Assessment and Plan :   Generalized abdominal pain - Plan: POCT glucose (manual entry), POCT urinalysis dipstick, CBC, CT Abdomen Pelvis W Contrast, Lipase, CANCELED: POCT CBC, CANCELED: POCT urinalysis dipstick  Will obtain a stat CT abdomen pelvis to rule out acute abdomen, diverticulitis, pancreatitis versus other intra-abdominal process.  Patient is in agreement with treatment plan.  Jaynee Eagles, PA-C [facility] [phone] 10/14/2017  2:52 PM

## 2017-10-14 NOTE — Patient Instructions (Addendum)
Abdominal Pain, Adult Abdominal pain can be caused by many things. Often, abdominal pain is not serious and it gets better with no treatment or by being treated at home. However, sometimes abdominal pain is serious. Your health care provider will do a medical history and a physical exam to try to determine the cause of your abdominal pain. Follow these instructions at home:  Take over-the-counter and prescription medicines only as told by your health care provider. Do not take a laxative unless told by your health care provider.  Drink enough fluid to keep your urine clear or pale yellow.  Watch your condition for any changes.  Keep all follow-up visits as told by your health care provider. This is important. Contact a health care provider if:  Your abdominal pain changes or gets worse.  You are not hungry or you lose weight without trying.  You are constipated or have diarrhea for more than 2-3 days.  You have pain when you urinate or have a bowel movement.  Your abdominal pain wakes you up at night.  Your pain gets worse with meals, after eating, or with certain foods.  You are throwing up and cannot keep anything down.  You have a fever. Get help right away if:  Your pain does not go away as soon as your health care provider told you to expect.  You cannot stop throwing up.  Your pain is only in areas of the abdomen, such as the right side or the left lower portion of the abdomen.  You have bloody or black stools, or stools that look like tar.  You have severe pain, cramping, or bloating in your abdomen.  You have signs of dehydration, such as: ? Dark urine, very little urine, or no urine. ? Cracked lips. ? Dry mouth. ? Sunken eyes. ? Sleepiness. ? Weakness. This information is not intended to replace advice given to you by your health care provider. Make sure you discuss any questions you have with your health care provider. Document Released: 01/09/2005 Document  Revised: 10/20/2015 Document Reviewed: 09/13/2015 Elsevier Interactive Patient Education  2018 [place]

## 2017-10-15 ENCOUNTER — Other Ambulatory Visit: Payer: Self-pay | Admitting: Urgent Care

## 2017-10-15 LAB — LIPASE: Lipase: 98 U/L — ABNORMAL HIGH (ref 13–78)

## 2017-10-15 LAB — CBC
HEMATOCRIT: 46 % (ref 37.5–51.0)
Hemoglobin: 15.5 g/dL (ref 13.0–17.7)
MCH: 28.2 pg (ref 26.6–33.0)
MCHC: 33.7 g/dL (ref 31.5–35.7)
MCV: 84 fL (ref 79–97)
Platelets: 257 10*3/uL (ref 150–450)
RBC: 5.5 x10E6/uL (ref 4.14–5.80)
RDW: 14.2 % (ref 12.3–15.4)
WBC: 16.4 10*3/uL — ABNORMAL HIGH (ref 3.4–10.8)

## 2017-10-15 MED ORDER — HYDROCODONE-ACETAMINOPHEN 5-325 MG PO TABS: 1.0000 | ORAL_TABLET | Freq: Four times a day (QID) | ORAL | 0 refills | Status: DC | PRN

## 2017-10-17 ENCOUNTER — Encounter: Payer: Self-pay | Admitting: Urgent Care

## 2017-10-17 ENCOUNTER — Ambulatory Visit: Payer: BLUE CROSS/BLUE SHIELD | Admitting: Urgent Care

## 2017-10-17 VITALS — BP 148/84 | HR 104 | Temp 98.8°F | Resp 18 | Ht 72.0 in | Wt 277.0 lb

## 2017-10-17 DIAGNOSIS — D72829 Elevated white blood cell count, unspecified: Secondary | ICD-10-CM | POA: Diagnosis not present

## 2017-10-17 DIAGNOSIS — R935 Abnormal findings on diagnostic imaging of other abdominal regions, including retroperitoneum: Secondary | ICD-10-CM | POA: Diagnosis not present

## 2017-10-17 DIAGNOSIS — R590 Localized enlarged lymph nodes: Secondary | ICD-10-CM | POA: Diagnosis not present

## 2017-10-17 DIAGNOSIS — R1084 Generalized abdominal pain: Secondary | ICD-10-CM | POA: Diagnosis not present

## 2017-10-17 MED ORDER — ONDANSETRON 8 MG PO TBDP: 8.0000 mg | ORAL_TABLET | Freq: Three times a day (TID) | ORAL | 5 refills | Status: DC | PRN

## 2017-10-17 NOTE — Patient Instructions (Addendum)
We are pursuing referrals for hematology oncology and to obtain a PET scan.  Please let me know if you have not yet received a phone call to schedule these within the next couple of weeks.  In the meantime if he ends up developing fever, worsening belly pain, inability to urinate or defecate, chest pain then please report to the ER.  Do not hesitate to call the clinic for refill of hydrocodone for your abdominal pain.  Keep in mind that it can cause constipation so you should try to take it with a stool softener such as Colace (docusate) 50 over mg 1-2 times daily.    Lymphadenopathy Lymphadenopathy refers to swollen or enlarged lymph glands, also called lymph nodes. Lymph glands are part of your body's defense (immune) system, which protects the body from infections, germs, and diseases. Lymph glands are found in many locations in your body, including the neck, underarm, and groin. Many things can cause lymph glands to become enlarged. When your immune system responds to germs, such as viruses or bacteria, infection-fighting cells and fluid build up. This causes the glands to grow in size. Usually, this is not something to worry about. The swelling and any soreness often go away without treatment. However, swollen lymph glands can also be caused by a number of diseases. Your health care provider may do various tests to help determine the cause. If the cause of your swollen lymph glands cannot be found, it is important to monitor your condition to make sure the swelling goes away. Follow these instructions at home: Watch your condition for any changes. The following actions may help to lessen any discomfort you are feeling:  Get plenty of rest.  Take medicines only as directed by your health care provider. Your health care provider may recommend over-the-counter medicines for pain.  Apply moist heat compresses to the site of swollen lymph nodes as directed by your health care provider. This can help  reduce any pain.  Check your lymph nodes daily for any changes.  Keep all follow-up visits as directed by your health care provider. This is important.  Contact a health care provider if:  Your lymph nodes are still swollen after 2 weeks.  Your swelling increases or spreads to other areas.  Your lymph nodes are hard, seem fixed to the skin, or are growing rapidly.  Your skin over the lymph nodes is red and inflamed.  You have a fever.  You have chills.  You have fatigue.  You develop a sore throat.  You have abdominal pain.  You have weight loss.  You have night sweats. Get help right away if:  You notice fluid leaking from the area of the enlarged lymph node.  You have severe pain in any area of your body.  You have chest pain.  You have shortness of breath. This information is not intended to replace advice given to you by your health care provider. Make sure you discuss any questions you have with your health care provider. Document Released: 01/09/2008 Document Revised: 09/07/2015 Document Reviewed: 11/04/2013 Elsevier Interactive Patient Education  2018 [employer].     IF you received an x-ray today, you will receive an invoice from [facility] Rad[facility]. Please contact [facility] at [phone] with questions or concerns regarding your invoice.   IF you received labwork today, you will receive an invoice from [facility]. Please contact [facility] at [phone] with questions or concerns regarding your invoice.   Our billing staff will not be able  to assist you with questions regarding bills from these companies.  You will be contacted with the lab results as soon as they are available. The fastest way to get your results is to activate your My Chart account. Instructions are located on the last page of this paperwork. If you have not heard from [place] regarding the results in 2 weeks, please contact this office.

## 2017-10-17 NOTE — Progress Notes (Signed)
   MRN: [id number] DOB: 10/15/1960  Subjective:   Neil Berg is a 57 y.o. male presenting for follow up on severe abdominal pain.  At his last office visit on 10/14/2017, patient had CT scan done which showed, "1. Retroperitoneal lymphadenopathy, highly suspicious for lymphoma or metastatic disease. 2. Prostate enlargement."  Patient had a prostate biopsy more than a year ago which was negative for an enlarged prostate.  He has had follow-up with urology for his prostate enlargement with a downtrending PSA level.  I spoke with his urologist, Dr. Jeffie Pollock, and counseled that upon review of the CT imaging he is not concern for metastatic disease from the prostate.  He will continue to monitor patient as appropriate. Denies unexplained fever, weight loss, night sweats over the past several weeks/months.    Neil Berg has a current medication list which includes the following prescription(s): amlodipine, aspirin ec, b complex vitamins, hydrocodone-acetaminophen, jardiance, and lisinopril-hydrochlorothiazide. Also has No Known Allergies.  Neil Berg  has a past medical history of Chest pain, Hypertension, and Obesity. Denies past surgical history. His family history includes Heart attack in his maternal grandfather; Heart disease in his maternal grandmother.   Objective:   Vitals: BP (!) 148/84   Pulse (!) 104   Temp 98.8 F (37.1 C) (Oral)   Resp 18   Ht 6' (1.829 m)   Wt 277 lb (125.6 kg)   SpO2 97%   BMI 37.57 kg/m   Physical Exam  Constitutional: He is oriented to person, place, and time. He appears well-developed and well-nourished.  HENT:  Mouth/Throat: Oropharynx is clear and moist.  Eyes: Right eye exhibits no discharge. Left eye exhibits no discharge. No scleral icterus.  Neck: Normal range of motion. Neck supple.  Cardiovascular: Normal rate, regular rhythm and intact distal pulses. Exam reveals no gallop and no friction rub.  No murmur heard. Pulmonary/Chest: No respiratory distress. He has  no wheezes. He has no rales.  Lymphadenopathy:       Head (right side): No submental, no submandibular, no tonsillar, no preauricular, no posterior auricular and no occipital adenopathy present.       Head (left side): No submental, no submandibular, no tonsillar, no preauricular, no posterior auricular and no occipital adenopathy present.    He has no cervical adenopathy.    He has no axillary adenopathy.       Right: No inguinal and no supraclavicular adenopathy present.       Left: No inguinal and no supraclavicular adenopathy present.  Neurological: He is alert and oriented to person, place, and time.  Skin: Skin is warm and dry.  Psychiatric: He has a normal mood and affect.   Assessment and Plan :   Abnormal CT of the abdomen - Plan: Ambulatory referral to Hematology / Oncology  Leukocytosis, unspecified type - Plan: CBC with Differential/Platelet, Pathologist smear review, Ambulatory referral to Hematology / Oncology  Retroperitoneal lymphadenopathy - Plan: NM PET Mets (NOPR) Whole Body (NaF), Ambulatory referral to Hematology / Oncology  Generalized abdominal pain  Referral to heme/onc pending. In the meantime, will pursue PET scan. Labs pending. Offered refills of hydrocodone, okay to call for refills. Start Zofran for any nausea or vomiting contributing to decreased appetite.   Jaynee Eagles, PA-C [facility] [phone] 10/17/2017 3:35 PM

## 2017-10-18 LAB — CBC WITH DIFFERENTIAL/PLATELET
BASOS ABS: 0 10*3/uL (ref 0.0–0.2)
Basos: 0 %
EOS (ABSOLUTE): 0.1 10*3/uL (ref 0.0–0.4)
Eos: 1 %
Hematocrit: 47.8 % (ref 37.5–51.0)
Hemoglobin: 15.9 g/dL (ref 13.0–17.7)
IMMATURE GRANS (ABS): 0.1 10*3/uL (ref 0.0–0.1)
Immature Granulocytes: 1 %
LYMPHS: 17 %
Lymphocytes Absolute: 2.4 10*3/uL (ref 0.7–3.1)
MCH: 27.3 pg (ref 26.6–33.0)
MCHC: 33.3 g/dL (ref 31.5–35.7)
MCV: 82 fL (ref 79–97)
MONOS ABS: 1.4 10*3/uL — AB (ref 0.1–0.9)
Monocytes: 10 %
NEUTROS ABS: 10 10*3/uL — AB (ref 1.4–7.0)
Neutrophils: 71 %
PLATELETS: 345 10*3/uL (ref 150–450)
RBC: 5.83 x10E6/uL — ABNORMAL HIGH (ref 4.14–5.80)
RDW: 13.2 % (ref 12.3–15.4)
WBC: 13.9 10*3/uL — ABNORMAL HIGH (ref 3.4–10.8)

## 2017-10-18 LAB — HGB SOLU + RFLX FRAC: SICKLE SOLUBILITY TEST - HGBRFX: NEGATIVE

## 2017-10-20 ENCOUNTER — Telehealth: Payer: Self-pay | Admitting: Urgent Care

## 2017-10-20 NOTE — Telephone Encounter (Signed)
Pt pet scan was denied through the insurance I called and spoke with a nurse it was still denied. A peer to peer can be done by calling [employer] [phone] pt id# [id number] dob: 20-Apr-1960. This is the only way this scan could possibly get approved. Thanks

## 2017-10-21 DIAGNOSIS — I10 Essential (primary) hypertension: Secondary | ICD-10-CM | POA: Diagnosis not present

## 2017-10-21 DIAGNOSIS — H40013 Open angle with borderline findings, low risk, bilateral: Secondary | ICD-10-CM | POA: Diagnosis not present

## 2017-10-21 DIAGNOSIS — H40053 Ocular hypertension, bilateral: Secondary | ICD-10-CM | POA: Diagnosis not present

## 2017-10-21 DIAGNOSIS — R590 Localized enlarged lymph nodes: Secondary | ICD-10-CM | POA: Diagnosis not present

## 2017-10-21 NOTE — Telephone Encounter (Addendum)
Please call patient and let them know that his insurance denied my order for the PET scan. At this point, let's try to have him see heme/onc to see if they deem it necessary and can approve the scan through his insurance.

## 2017-10-22 ENCOUNTER — Telehealth: Payer: Self-pay | Admitting: Urgent Care

## 2017-10-22 ENCOUNTER — Other Ambulatory Visit: Payer: Self-pay | Admitting: Urgent Care

## 2017-10-22 NOTE — Telephone Encounter (Signed)
Copied from [place] [id number]. Topic: Referral - Status >> Oct 22, 2017  9:15 AM Synthia Innocent wrote: Reason for CRM: Requesting status of PET scan and hematology referral.

## 2017-10-22 NOTE — Telephone Encounter (Signed)
Copied from [place]. Topic: Quick Communication - Lab Results >> Oct 22, 2017  9:13 AM Synthia Innocent wrote: Requesting lab results

## 2017-10-23 ENCOUNTER — Telehealth: Payer: Self-pay | Admitting: Urgent Care

## 2017-10-23 MED ORDER — HYDROCODONE-ACETAMINOPHEN 5-325 MG PO TABS: 1.0000 | ORAL_TABLET | Freq: Four times a day (QID) | ORAL | 0 refills | Status: DC | PRN

## 2017-10-23 NOTE — Telephone Encounter (Signed)
Please let patient know that hydrocodone should be used for severe pain. It is additive in nature and can cause constipation. I highly recommend using it judiciously and with a stool softener like Colace 50mg  twice daily if he is taking this daily.

## 2017-10-23 NOTE — Telephone Encounter (Signed)
We don't have the peripheral smear back yet. Will request an update from [place]. Please let patient know that [facility] can take up to 2 weeks to complete a peripheral smear.

## 2017-10-23 NOTE — Telephone Encounter (Signed)
Called pt LMOVM with Mani's message re: PET scan & hemo referral

## 2017-10-23 NOTE — Telephone Encounter (Signed)
Patient called and advised of the note by Jaynee Eagles, PA-C on 10/23/17, patient verbalized understanding. He says he only takes Hydrocodone at night to help the pain while he sleeps and he is fine with his bowel movements.

## 2017-10-23 NOTE — Telephone Encounter (Signed)
Message from pt for lab results sent to [facility]

## 2017-10-23 NOTE — Telephone Encounter (Signed)
Patient called and asked about his lab results from 10/17/17, I advised the results have not been released to discuss and I will send this request to the office to let the provider know, he verbalized understanding.

## 2017-10-24 ENCOUNTER — Encounter: Payer: Self-pay | Admitting: Hematology

## 2017-10-24 ENCOUNTER — Telehealth: Payer: Self-pay | Admitting: Hematology

## 2017-10-24 NOTE — Telephone Encounter (Signed)
Urgent new referral received from Dr. Moreen Fowler for lymphadenopathy. Pt has been scheduled to see Dr. Irene Limbo on 7/17 at 11am. Pt aware to arrive 30 minutes early. Letter mailed.

## 2017-10-28 NOTE — Progress Notes (Signed)
HEMATOLOGY/ONCOLOGY CONSULTATION NOTE  Date of Service: 10/29/2017  Patient Care Team: Angelina Pih, MD (Inactive) as PCP - General (Family Medicine)  CHIEF COMPLAINTS/PURPOSE OF CONSULTATION:  Lymphadenopathy   HISTORY OF PRESENTING ILLNESS:   Neil Berg is a wonderful 57 y.o. male who has been referred to [place] by Dr. Antony Contras for evaluation and management of Lymphadenopathy. He is accompanied today by his wife. The pt reports that he is doing well overall and [place]   The pt reports that he hasn't had any pain in over a week and recently appeared to [facility] on 10/17/17. He notes that he woke up on the morning of 10/17/17 with significant, unique, back and kidney pain which began to spread around to the RUQ of his abdomen as the morning progressed. He notes that his pain went away after taking pain medications for 5 days, and describes that the pain was waxing and waning. He also endorses some positional exacerbation to his pain. He denies any nausea, vomiting, diarrhea, changes in bowel habits, testicular pain or swelling, difficulty/changes in urination at the time or since then.    The pt also notes that  In the week of these symptoms he felt very cold, which is different for him as he describes himself as very warm natured. He also notes that his appetite weakened and he lost about 10 pounds, and was abiding by the [employer]. He notes that his appetite has returned and he is eating normally again. He denies any pain associated with meals. He denies recent exotic or international travel  He takes Jardiance for his DM. He notes that his PSA spiked two years ago, had a biopsy, and his PSA counts have decreased recently and is believed to be BPH.    He also notes that he had a recurring rash on his calves, worse on the left, that was red and flat, mimicking his sock impression, but extending further up his leg. He notes that this  happened over 3 months, and that it came and went without any discernable pattern. He denies any associated itching.   He also notes a place on the front of his left lower leg that has been slightly swollen for the past a week. He denies remembering running into anything, bruising, or pain.   Of note prior to the patient's visit today, pt has had CT A/P completed on 10/14/17 with results revealing Retroperitoneal lymphadenopathy, highly suspicious for lymphoma or metastatic disease. 2. Prostate enlargement 3.  Aortic Atherosclerosis.   Most recent lab results (10/17/17) of CBC w/diff is as follows: all values are WNL except for WBC at 13.9k, RBC at 5.83, ANC at 10.0k, Monocytes abs at 1.4k.  On review of systems, pt reports recent back pain, feeling cold recently, recent abdominal pain, and denies nausea, vomiting, diarrhea, changes in bowel habits, difficulty/changes in urination, blood in the urine, abdominal trauma, constipation, fevers, night sweats, chills, testicular pain or swelling, noticing any lumps or bumps, unexpected weight loss, and any other symptoms.   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  On Social Hx the pt reports working in [employer] and denies chemical/radiaiton exposure. He denies ever smoking and other drug use.   MEDICAL HISTORY:  Past Medical History:  Diagnosis Date  . Chest pain   . Hypertension   .  Obesity   Type II Diabetes Mellitus   SURGICAL HISTORY: History reviewed. No pertinent surgical history.  Tonsillectomy 1974 Cataract surgery-left eye 04/2015 Colonoscopy 06/04/16  SOCIAL HISTORY: Social History   Socioeconomic History  . Marital status: Married    Spouse name: Not on file  . Number of children: Not on file  . Years of education: Not on file  . Highest education level: Not on file  Occupational History  . Not  on file  Social Needs  . Financial resource strain: Not on file  . Food insecurity:    Worry: Not on file    Inability: Not on file  . Transportation needs:    Medical: Not on file    Non-medical: Not on file  Tobacco Use  . Smoking status: Never Smoker  . Smokeless tobacco: Never Used  Substance and Sexual Activity  . Alcohol use: No  . Drug use: No  . Sexual activity: Not on file  Lifestyle  . Physical activity:    Days per week: Not on file    Minutes per session: Not on file  . Stress: Not on file  Relationships  . Social connections:    Talks on phone: Not on file    Gets together: Not on file    Attends religious service: Not on file    Active member of club or organization: Not on file    Attends meetings of clubs or organizations: Not on file    Relationship status: Not on file  . Intimate partner violence:    Fear of current or ex partner: Not on file    Emotionally abused: Not on file    Physically abused: Not on file    Forced sexual activity: Not on file  Other Topics Concern  . Not on file  Social History Narrative  . Not on file    FAMILY HISTORY: Family History  Problem Relation Age of Onset  . Heart disease Maternal Grandmother   . Heart attack Maternal Grandfather     ALLERGIES:  has No Known Allergies.  MEDICATIONS:  Current Outpatient Medications  Medication Sig Dispense Refill  . amLODipine (NORVASC) 5 MG tablet Take 1 tablet (5 mg total) by mouth daily. 30 tablet 11  . aspirin EC 81 MG tablet Take 81 mg by mouth daily.    Marland Kitchen b complex vitamins tablet Take 1 tablet by mouth daily.    Marland Kitchen HYDROcodone-acetaminophen (NORCO/VICODIN) 5-325 MG tablet Take 1 tablet by mouth every 6 (six) hours as needed for moderate pain or severe pain. 30 tablet 0  . JARDIANCE 25 MG TABS tablet TK 1 T PO QAM  0  . lisinopril-hydrochlorothiazide (PRINZIDE,ZESTORETIC) 20-12.5 MG tablet Take 1 tablet by mouth daily. 30 tablet 0   No current facility-administered  medications for this visit.     REVIEW OF SYSTEMS:    10 Point review of Systems was done is negative except as noted above.  PHYSICAL EXAMINATION: ECOG PERFORMANCE STATUS: 1 - Symptomatic but completely ambulatory  . Vitals:   10/29/17 1104  BP: (!) 145/74  Pulse: 80  Resp: 18  Temp: 98.6 F (37 C)  SpO2: 96%   Filed Weights   10/29/17 1104  Weight: 280 lb 11.2 oz (127.3 kg)   .Body mass index is 38.07 kg/m.  GENERAL:alert, in no acute distress and comfortable SKIN: no acute rashes, no significant lesions EYES: conjunctiva are pink and non-injected, sclera anicteric OROPHARYNX: MMM, no exudates, no oropharyngeal erythema or ulceration NECK: supple,  no JVD LYMPH:  no palpable lymphadenopathy in the cervical, axillary or inguinal regions LUNGS: clear to auscultation b/l with normal respiratory effort HEART: regular rate & rhythm ABDOMEN:  normoactive bowel sounds , non tender, not distended. Extremity: no pedal edema PSYCH: alert & oriented x 3 with fluent speech NEURO: no focal motor/sensory deficits  LABORATORY DATA:  I have reviewed the data as listed  . CBC Latest Ref Rng & Units 10/29/2017 10/17/2017 10/14/2017  WBC 4.0 - 10.3 K/uL 10.6(H) 13.9(H) 16.4(H)  Hemoglobin 13.0 - 17.1 g/dL 15.0 15.9 15.5  Hematocrit 38.4 - 49.9 % 45.5 47.8 46.0  Platelets 140 - 400 K/uL 347 345 257    . CMP Latest Ref Rng & Units 10/29/2017 10/14/2017 09/26/2013  Glucose 70 - 99 mg/dL 116(H) - 155(H)  BUN 6 - 20 mg/dL 15 - 9  Creatinine 0.61 - 1.24 mg/dL 0.83 0.50(L) 0.52  Sodium 135 - 145 mmol/L 137 - 143  Potassium 3.5 - 5.1 mmol/L 4.2 - 4.0  Chloride 98 - 111 mmol/L 97(L) - 99  CO2 22 - 32 mmol/L 30 - 26  Calcium 8.9 - 10.3 mg/dL 9.9 - 9.4  Total Protein 6.5 - 8.1 g/dL 8.3(H) - -  Total Bilirubin 0.3 - 1.2 mg/dL 0.4 - -  Alkaline Phos 38 - 126 U/L 129(H) - -  AST 15 - 41 U/L 18 - -  ALT 0 - 44 U/L 25 - -     RADIOGRAPHIC STUDIES: I have personally reviewed the  radiological images as listed and agreed with the findings in the report. Ct Abdomen Pelvis W Contrast  Result Date: 10/14/2017 CLINICAL DATA:  57 year old male with acute RIGHT flank and abdominal pain. EXAM: CT ABDOMEN AND PELVIS WITH CONTRAST TECHNIQUE: Multidetector CT imaging of the abdomen and pelvis was performed using the standard protocol following bolus administration of intravenous contrast. CONTRAST:  151mL ISOVUE-300 IOPAMIDOL (ISOVUE-300) INJECTION 61% COMPARISON:  09/26/2013 CT FINDINGS: Lower chest: No acute abnormality. Hepatobiliary: The liver and gallbladder are unremarkable. No biliary dilatation. Pancreas: Unremarkable Spleen: Unremarkable except for a stable likely benign 1.3 cm hypodense splenic lesion. Adrenals/Urinary Tract: The kidneys, adrenal glands and bladder are unremarkable except for bilateral renal cysts. Stomach/Bowel: Stomach is within normal limits. Appendix appears normal. No evidence of bowel wall thickening, distention, or inflammatory changes. Vascular/Lymphatic: Enlarged retroperitoneal periaortic lymph nodes are identified (series 2: Images 44-56). Index lymph nodes measure 4.2 x 4.5 cm on the LEFT (2:51), and 1 x 1.3 cm on the RIGHT (2:53). Aortic atherosclerotic calcifications noted without aneurysm. Reproductive: Prostate enlargement again noted. Other: No ascites, focal collection or pneumoperitoneum. Musculoskeletal: No acute or suspicious abnormalities. IMPRESSION: 1. Retroperitoneal lymphadenopathy, highly suspicious for lymphoma or metastatic disease. 2. Prostate enlargement 3.  Aortic Atherosclerosis (ICD10-I70.0). Electronically Signed   By: Margarette Canada M.D.   On: 10/14/2017 18:44    ASSESSMENT & PLAN:   57 y.o. male with  1. Retroperitoneal  Lymphadenopathy -significant. Concerning for lymphoma vs metastatic malignancy with unknown primary. PLAN -Discussed patient's most recent labs from 10/17/17, HGB normal at 15.9, PLT normal at 345k, ANC at  10.0k -Discussed the 10/14/17 CT Abdomen/Pelvis which revealed Retroperitoneal lymphadenopathy, highly suspicious for lymphoma or metastatic disease. Prostate enlargement. -Will order a PET/CT to be scheduled after July 30 ( as per patients request) -Concerned for occult malignancy presenting with retroperitoneal lymphadenopthy  -Will order a LN biopsy to be completed after collecting PET/CT results to determine best target for evaluation -Will order blood tests today with  tumor markers - reviewed as noted below - only some LDH elevated other tumor markers unrevealing. Component     Latest Ref Rng & Units 10/29/2017  Chromogranin A     0 - 5 nmol/L <1  hCG Quant     0 - 3 mIU/mL 2  LDH     98 - 192 U/L 235 (H)  CEA ([facility])     0.00 - 5.00 ng/mL 1.84  AFP, Serum, Tumor Marker     0.0 - 8.3 ng/mL 2.6  CA 19-9     0 - 35 U/mL 13   -Will see pt back in 3 weeks    Labs today PET/CT in 2 weeks RTC with Dr Irene Limbo in 3 weeks    All of the patients questions were answered with apparent satisfaction. The patient knows to call the clinic with any problems, questions or concerns.  The total time spent in the appt was 45 minutes and more than 50% was on counseling and direct patient cares.    Sullivan Lone MD MS AAHIVMS [facility] Hematology/Oncology Physician [facility]  (Office):       [phone] (Work cell):  [phone] (Fax):           [phone]  10/29/2017 11:47 AM  I, Baldwin Jamaica, am acting as a Education administrator for Dr Irene Limbo.   .I have reviewed the above documentation for accuracy and completeness, and I agree with the above. Brunetta Genera MD

## 2017-10-29 ENCOUNTER — Inpatient Hospital Stay: Payer: BLUE CROSS/BLUE SHIELD | Attending: Hematology | Admitting: Hematology

## 2017-10-29 ENCOUNTER — Encounter: Payer: Self-pay | Admitting: Hematology

## 2017-10-29 ENCOUNTER — Inpatient Hospital Stay: Payer: BLUE CROSS/BLUE SHIELD

## 2017-10-29 ENCOUNTER — Telehealth: Payer: Self-pay

## 2017-10-29 VITALS — BP 145/74 | HR 80 | Temp 98.6°F | Resp 18 | Ht 72.0 in | Wt 280.7 lb

## 2017-10-29 DIAGNOSIS — R590 Localized enlarged lymph nodes: Secondary | ICD-10-CM | POA: Insufficient documentation

## 2017-10-29 DIAGNOSIS — Z79899 Other long term (current) drug therapy: Secondary | ICD-10-CM | POA: Insufficient documentation

## 2017-10-29 DIAGNOSIS — I10 Essential (primary) hypertension: Secondary | ICD-10-CM | POA: Insufficient documentation

## 2017-10-29 DIAGNOSIS — Z7982 Long term (current) use of aspirin: Secondary | ICD-10-CM | POA: Diagnosis not present

## 2017-10-29 DIAGNOSIS — E119 Type 2 diabetes mellitus without complications: Secondary | ICD-10-CM | POA: Diagnosis not present

## 2017-10-29 DIAGNOSIS — E669 Obesity, unspecified: Secondary | ICD-10-CM | POA: Insufficient documentation

## 2017-10-29 DIAGNOSIS — N281 Cyst of kidney, acquired: Secondary | ICD-10-CM | POA: Insufficient documentation

## 2017-10-29 DIAGNOSIS — N400 Enlarged prostate without lower urinary tract symptoms: Secondary | ICD-10-CM | POA: Diagnosis not present

## 2017-10-29 DIAGNOSIS — I700 Atherosclerosis of aorta: Secondary | ICD-10-CM | POA: Diagnosis not present

## 2017-10-29 DIAGNOSIS — C801 Malignant (primary) neoplasm, unspecified: Secondary | ICD-10-CM

## 2017-10-29 LAB — CBC WITH DIFFERENTIAL/PLATELET
BASOS PCT: 1 %
Basophils Absolute: 0.1 10*3/uL (ref 0.0–0.1)
EOS ABS: 0.2 10*3/uL (ref 0.0–0.5)
Eosinophils Relative: 1 %
HCT: 45.5 % (ref 38.4–49.9)
Hemoglobin: 15 g/dL (ref 13.0–17.1)
Lymphocytes Relative: 26 %
Lymphs Abs: 2.7 10*3/uL (ref 0.9–3.3)
MCH: 27.5 pg (ref 27.2–33.4)
MCHC: 32.9 g/dL (ref 32.0–36.0)
MCV: 83.6 fL (ref 79.3–98.0)
MONO ABS: 0.8 10*3/uL (ref 0.1–0.9)
MONOS PCT: 8 %
Neutro Abs: 6.8 10*3/uL — ABNORMAL HIGH (ref 1.5–6.5)
Neutrophils Relative %: 64 %
Platelets: 347 10*3/uL (ref 140–400)
RBC: 5.44 MIL/uL (ref 4.20–5.82)
RDW: 13.4 % (ref 11.0–14.6)
WBC: 10.6 10*3/uL — ABNORMAL HIGH (ref 4.0–10.3)

## 2017-10-29 LAB — CEA (IN HOUSE-CHCC): CEA (CHCC-In House): 1.84 ng/mL (ref 0.00–5.00)

## 2017-10-29 LAB — CMP (CANCER CENTER ONLY)
ALBUMIN: 4.3 g/dL (ref 3.5–5.0)
ALT: 25 U/L (ref 0–44)
AST: 18 U/L (ref 15–41)
Alkaline Phosphatase: 129 U/L — ABNORMAL HIGH (ref 38–126)
Anion gap: 10 (ref 5–15)
BILIRUBIN TOTAL: 0.4 mg/dL (ref 0.3–1.2)
BUN: 15 mg/dL (ref 6–20)
CALCIUM: 9.9 mg/dL (ref 8.9–10.3)
CHLORIDE: 97 mmol/L — AB (ref 98–111)
CO2: 30 mmol/L (ref 22–32)
Creatinine: 0.83 mg/dL (ref 0.61–1.24)
GFR, Est AFR Am: >60 mL/min (ref >60)
GLUCOSE: 116 mg/dL — AB (ref 70–99)
Potassium: 4.2 mmol/L (ref 3.5–5.1)
Sodium: 137 mmol/L (ref 135–145)
Total Protein: 8.3 g/dL — ABNORMAL HIGH (ref 6.5–8.1)

## 2017-10-29 LAB — URIC ACID: Uric Acid, Serum: 5.4 mg/dL (ref 3.7–8.6)

## 2017-10-29 LAB — LACTATE DEHYDROGENASE: LDH: 235 U/L — ABNORMAL HIGH (ref 98–192)

## 2017-10-29 NOTE — Telephone Encounter (Signed)
Printed avs and calender of upcoming appointment. Per 7/17 los 

## 2017-10-30 ENCOUNTER — Ambulatory Visit: Payer: BLUE CROSS/BLUE SHIELD | Admitting: Urgent Care

## 2017-10-31 LAB — BETA HCG QUANT (REF LAB): HCG QUANT: 2 m[IU]/mL (ref 0–3)

## 2017-10-31 LAB — CANCER ANTIGEN 19-9: CAN 19-9: 13 U/mL (ref 0–35)

## 2017-10-31 LAB — CHROMOGRANIN A: Chromogranin A: <1 nmol/L (ref 0–5)

## 2017-10-31 LAB — AFP TUMOR MARKER: AFP, SERUM, TUMOR MARKER: 2.6 ng/mL (ref 0.0–8.3)

## 2017-11-17 ENCOUNTER — Ambulatory Visit (HOSPITAL_COMMUNITY)
Admission: RE | Admit: 2017-11-17 | Discharge: 2017-11-17 | Disposition: A | Discharge status: Home / Self Care | Payer: BLUE CROSS/BLUE SHIELD | Source: Ambulatory Visit | Attending: Hematology | Admitting: Hematology

## 2017-11-17 DIAGNOSIS — R590 Localized enlarged lymph nodes: Secondary | ICD-10-CM | POA: Diagnosis not present

## 2017-11-17 DIAGNOSIS — C801 Malignant (primary) neoplasm, unspecified: Secondary | ICD-10-CM

## 2017-11-17 DIAGNOSIS — R599 Enlarged lymph nodes, unspecified: Secondary | ICD-10-CM | POA: Diagnosis not present

## 2017-11-17 LAB — GLUCOSE, CAPILLARY: GLUCOSE-CAPILLARY: 144 mg/dL — AB (ref 70–99)

## 2017-11-17 MED ORDER — FLUDEOXYGLUCOSE F - 18 (FDG) INJECTION: 13.9000 | Freq: Once | INTRAVENOUS | Status: DC | PRN

## 2017-11-19 NOTE — Progress Notes (Signed)
HEMATOLOGY/ONCOLOGY CONSULTATION NOTE  Date of Service: 11/20/2017  Patient Care Team: Angelina Pih, MD as PCP - General (Family Medicine)  CHIEF COMPLAINTS/PURPOSE OF CONSULTATION:  Lymphadenopathy   HISTORY OF PRESENTING ILLNESS:   Neil Berg is a wonderful 57 y.o. male who has been referred to [place] by Dr. Antony Contras for evaluation and management of Lymphadenopathy. He is accompanied today by his wife. The pt reports that he is doing well overall and [place]   The pt reports that he hasn't had any pain in over a week and recently appeared to [facility] on 10/17/17. He notes that he woke up on the morning of 10/17/17 with significant, unique, back and kidney pain which began to spread around to the RUQ of his abdomen as the morning progressed. He notes that his pain went away after taking pain medications for 5 days, and describes that the pain was waxing and waning. He also endorses some positional exacerbation to his pain. He denies any nausea, vomiting, diarrhea, changes in bowel habits, testicular pain or swelling, difficulty/changes in urination at the time or since then.    The pt also notes that  In the week of these symptoms he felt very cold, which is different for him as he describes himself as very warm natured. He also notes that his appetite weakened and he lost about 10 pounds, and was abiding by the [employer]. He notes that his appetite has returned and he is eating normally again. He denies any pain associated with meals. He denies recent exotic or international travel  He takes Jardiance for his DM. He notes that his PSA spiked two years ago, had a biopsy, and his PSA counts have decreased recently and is believed to be BPH.    He also notes that he had a recurring rash on his calves, worse on the left, that was red and flat, mimicking his sock impression, but extending further up his leg. He notes that this happened over  3 months, and that it came and went without any discernable pattern. He denies any associated itching.   He also notes a place on the front of his left lower leg that has been slightly swollen for the past a week. He denies remembering running into anything, bruising, or pain.   Of note prior to the patient's visit today, pt has had CT A/P completed on 10/14/17 with results revealing Retroperitoneal lymphadenopathy, highly suspicious for lymphoma or metastatic disease. 2. Prostate enlargement 3.  Aortic Atherosclerosis.   Most recent lab results (10/17/17) of CBC w/diff is as follows: all values are WNL except for WBC at 13.9k, RBC at 5.83, ANC at 10.0k, Monocytes abs at 1.4k.  On review of systems, pt reports recent back pain, feeling cold recently, recent abdominal pain, and denies nausea, vomiting, diarrhea, changes in bowel habits, difficulty/changes in urination, blood in the urine, abdominal trauma, constipation, fevers, night sweats, chills, testicular pain or swelling, noticing any lumps or bumps, unexpected weight loss, and any other symptoms.   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  On Social Hx the pt reports working in [employer] and denies chemical/radiaiton exposure. He denies ever smoking and other drug use.  Interval History:   [name] returns today regarding his retroperitoneal lymphadenopathy. The patient's last visit with [place] was  on 10/29/17. He is accompanied today by his wife. The pt reports that he is doing well overall.   The pt reports that he has had intermittent pain on his right flank, which was significant enough to take pain medications on two nights in the interim. He had a couple rashes appear on his legs that went away after a day or two in the interim. He notes that he has had no other concerns and has not developed any  constitutional symptoms in the interim. The pt has not traveled out of the country recently and has not experienced any trauma recently.   Of note since the patient's last visit, pt has had PET/CT completed on 11/17/17 with results revealing Isolated hypermetabolic retroperitoneal lymphadenopathy. Findings suspicious for lymphoma as no primary lesion is identified to suggest this is metastatic adenopathy. Recommend retroperitoneal biopsy. The largest left-sided node appears necrotic and is actually decreased in size since the prior CT scan. It appeared inflamed on the prior study and may have infarcted. I would not recommend biopsying this lesion. No adenopathy in the neck, axilla, chest, pelvis or inguinal regions. No worrisome pulmonary lesions or osseous lesions.  Lab results (10/29/17) of CBC w/diff, CMP is as follows: all values are WNL except for WBC at 10.6k, ANC at 6.8k, Chloride at 97, Glucose at 16, Total Protein at 8.3, Alk Phos at 129. LDH 10/29/17 elevated at 235 CEA 10/29/17 was WNL at 1.84 AFP Tumor Marker 10/29/17 was WNL at 2.6 CA19-9 from 10/29/17 was WNL at 13  On review of systems, pt reports intermittent right flank pain, recent mild leg rash, stable energy levels, eating well, and denies fevers, chills, night sweats, changes in bowel habits, and any other symptoms.   MEDICAL HISTORY:  Past Medical History:  Diagnosis Date   Chest pain    Hypertension    Obesity   Type II Diabetes Mellitus   SURGICAL HISTORY: No past surgical history on file.  Tonsillectomy 1974 Cataract surgery-left eye 04/2015 Colonoscopy 06/04/16  SOCIAL HISTORY: Social History   Socioeconomic History   Marital status: Married    Spouse name: Not on file   Number of children: Not on file   Years of education: Not on file   Highest education level: Not on file  Occupational History   Not on file  Social Needs   Financial resource strain: Not on file   Food insecurity:    Worry: Not on  file    Inability: Not on file   Transportation needs:    Medical: Not on file    Non-medical: Not on file  Tobacco Use   Smoking status: Never Smoker   Smokeless tobacco: Never Used  Substance and Sexual Activity   Alcohol use: No   Drug use: No   Sexual activity: Not on file  Lifestyle   Physical activity:    Days per week: Not on file    Minutes per session: Not on file   Stress: Not on file  Relationships   Social connections:    Talks on phone: Not on file    Gets together: Not on file    Attends religious service: Not on file    Active member of club or organization: Not on file    Attends meetings of clubs or organizations: Not on file    Relationship status: Not on file   Intimate partner violence:    Fear of current or ex partner: Not on file    Emotionally abused: Not  on file    Physically abused: Not on file    Forced sexual activity: Not on file  Other Topics Concern   Not on file  Social History Narrative   Not on file    FAMILY HISTORY: Family History  Problem Relation Age of Onset   Heart disease Maternal Grandmother    Heart attack Maternal Grandfather     ALLERGIES:  has No Known Allergies.  MEDICATIONS:  Current Outpatient Medications  Medication Sig Dispense Refill   amLODipine (NORVASC) 5 MG tablet Take 1 tablet (5 mg total) by mouth daily. 30 tablet 11   aspirin EC 81 MG tablet Take 81 mg by mouth daily.     b complex vitamins tablet Take 1 tablet by mouth daily.     HYDROcodone-acetaminophen (NORCO/VICODIN) 5-325 MG tablet Take 1 tablet by mouth every 6 (six) hours as needed for moderate pain or severe pain. 30 tablet 0   JARDIANCE 25 MG TABS tablet TK 1 T PO QAM  0   lisinopril-hydrochlorothiazide (PRINZIDE,ZESTORETIC) 20-12.5 MG tablet Take 1 tablet by mouth daily. 30 tablet 0   No current facility-administered medications for this visit.    Facility-Administered Medications Ordered in Other Visits  Medication Dose  Route Frequency Provider Last Rate Last Dose   fludeoxyglucose F - 18 (FDG) injection 63.7 millicurie  85.8 millicurie Intravenous Once PRN Titus Dubin, MD        REVIEW OF SYSTEMS:    A 10+ POINT REVIEW OF SYSTEMS WAS OBTAINED including neurology, dermatology, psychiatry, cardiac, respiratory, lymph, extremities, GI, GU, Musculoskeletal, constitutional, breasts, reproductive, HEENT.  All pertinent positives are noted in the HPI.  All others are negative.   PHYSICAL EXAMINATION: ECOG PERFORMANCE STATUS: 1 - Symptomatic but completely ambulatory  . Vitals:   11/20/17 1425  BP: 137/82  Pulse: 87  Resp: 18  Temp: 98.4 F (36.9 C)  SpO2: 98%   Filed Weights   11/20/17 1425  Weight: 286 lb 12.8 oz (130.1 kg)   .Body mass index is 38.9 kg/m.  GENERAL:alert, in no acute distress and comfortable SKIN: no acute rashes, no significant lesions EYES: conjunctiva are pink and non-injected, sclera anicteric OROPHARYNX: MMM, no exudates, no oropharyngeal erythema or ulceration NECK: supple, no JVD LYMPH:  no palpable lymphadenopathy in the cervical, axillary or inguinal regions LUNGS: clear to auscultation b/l with normal respiratory effort HEART: regular rate & rhythm ABDOMEN:  normoactive bowel sounds , non tender, not distended. No palpable hepatosplenomegaly.  Extremity: no pedal edema PSYCH: alert & oriented x 3 with fluent speech NEURO: no focal motor/sensory deficits   LABORATORY DATA:  I have reviewed the data as listed  . CBC Latest Ref Rng & Units 10/29/2017 10/17/2017 10/14/2017  WBC 4.0 - 10.3 K/uL 10.6(H) 13.9(H) 16.4(H)  Hemoglobin 13.0 - 17.1 g/dL 15.0 15.9 15.5  Hematocrit 38.4 - 49.9 % 45.5 47.8 46.0  Platelets 140 - 400 K/uL 347 345 257    . CMP Latest Ref Rng & Units 10/29/2017 10/14/2017 09/26/2013  Glucose 70 - 99 mg/dL 116(H) - 155(H)  BUN 6 - 20 mg/dL 15 - 9  Creatinine 0.61 - 1.24 mg/dL 0.83 0.50(L) 0.52  Sodium 135 - 145 mmol/L 137 - 143  Potassium 3.5  - 5.1 mmol/L 4.2 - 4.0  Chloride 98 - 111 mmol/L 97(L) - 99  CO2 22 - 32 mmol/L 30 - 26  Calcium 8.9 - 10.3 mg/dL 9.9 - 9.4  Total Protein 6.5 - 8.1 g/dL 8.3(H) - -  Total Bilirubin 0.3 - 1.2 mg/dL 0.4 - -  Alkaline Phos 38 - 126 U/L 129(H) - -  AST 15 - 41 U/L 18 - -  ALT 0 - 44 U/L 25 - -     RADIOGRAPHIC STUDIES: I have personally reviewed the radiological images as listed and agreed with the findings in the report. Nm Pet Image Initial (pi) Skull Base To Thigh  Result Date: 11/17/2017 CLINICAL DATA:  Initial treatment strategy for lymphadenopathy. EXAM: NUCLEAR MEDICINE PET SKULL BASE TO THIGH TECHNIQUE: 13.9 mCi F-18 FDG was injected intravenously. Full-ring PET imaging was performed from the skull base to thigh after the radiotracer. CT data was obtained and used for attenuation correction and anatomic localization. Fasting blood glucose: 144 mg/dl COMPARISON:  CT scan 10/14/2017 FINDINGS: Mediastinal blood pool activity: SUV max 2.64 NECK: No hypermetabolic lymph nodes in the neck. Incidental CT findings: none CHEST: No hypermetabolic mediastinal or hilar nodes. No suspicious pulmonary nodules on the CT scan. Incidental CT findings: Densely calcified lesion in the subcutaneous fat in the lower left thorax posteriorly likely due to remote trauma. ABDOMEN/PELVIS: Hypermetabolic retroperitoneal lymphadenopathy. 15.5 mm right-sided retroperitoneal lymph node posterior to the IVC on image number 136 has an SUV max of 14.19. This measured 12 mm on the prior CT scan. 3 cm left-sided necrotic appearing retroperitoneal lymph node previously measured 4.5 cm. It contains a small focus of hypermetabolism with SUV max of 5.5. Two adjacent left-sided retroperitoneal lymph nodes on image number 846 are hypermetabolic with SUV max of 65.99. The larger more lateral node measures 2.1 cm and previously measured 1.8 cm. The solid abdominal organs are unremarkable. No hepatic or splenic lesions or splenomegaly. No  pelvic or inguinal lymphadenopathy. No obvious testicular or prostate lesions. Incidental CT findings: Enlarged prostate gland. Age advanced scattered atherosclerotic aortic and iliac artery calcifications. SKELETON: No focal hypermetabolic activity to suggest skeletal metastasis. Incidental CT findings: none IMPRESSION: 1. Isolated hypermetabolic retroperitoneal lymphadenopathy. Findings suspicious for lymphoma as no primary lesion is identified to suggest this is metastatic adenopathy. Recommend retroperitoneal biopsy. The largest left-sided node appears necrotic and is actually decreased in size since the prior CT scan. It appeared inflamed on the prior study and may have infarcted. I would not recommend biopsying this lesion. 2. No adenopathy in the neck, axilla, chest, pelvis or inguinal regions. 3. No worrisome pulmonary lesions or osseous lesions. Electronically Signed   By: Marijo Sanes M.D.   On: 11/17/2017 08:50    ASSESSMENT & PLAN:   57 y.o. male with  1. Retroperitoneal  Lymphadenopathy -significant. Concerning for lymphoma vs metastatic malignancy with unknown primary. 10/14/17 CT Abdomen/Pelvis revealed Retroperitoneal lymphadenopathy, highly suspicious for lymphoma or metastatic disease. Prostate enlargement.  PLAN:  -Concerned for occult malignancy presenting with retroperitoneal lymphadenopthy  -Discussed pt labwork from 10/29/17; no anemia, no thrombocytopenia, no lymphocytosis, ANC at 6.8k. LDH mildly elevated at 235 -Solid tumor markers were WNL, including CEA, AFP and CA19-9  -Discussed the 11/17/17 PET/CT which revealed Isolated hypermetabolic retroperitoneal lymphadenopathy. Findings suspicious for lymphoma as no primary lesion is identified to suggest this is metastatic adenopathy. Recommend retroperitoneal biopsy. The largest left-sided node appears necrotic and is actually decreased in size since the prior CT scan. It appeared inflamed on the prior study and may have infarcted. I  would not recommend biopsying this lesion. No adenopathy in the neck, axilla, chest, pelvis or inguinal regions. No worrisome pulmonary lesions or osseous lesions. -Discussed my recommendation to work up the hypermetabolic lymph nodes  further as opposed to pursuing a wait and watch approach -Will refer the pt to IR for biopsy evaluation -Will see the pt back in 10-12 days   Component     Latest Ref Rng & Units 10/29/2017  Chromogranin A     0 - 5 nmol/L <1  hCG Quant     0 - 3 mIU/mL 2  LDH     98 - 192 U/L 235 (H)  CEA ([facility])     0.00 - 5.00 ng/mL 1.84  AFP, Serum, Tumor Marker     0.0 - 8.3 ng/mL 2.6  CA 19-9     0 - 35 U/mL 13      CT guided Biopsy ASAP within 3-5 days RTC with Dr Irene Limbo in 12 days     All of the patients questions were answered with apparent satisfaction. The patient knows to call the clinic with any problems, questions or concerns.  The total time spent in the appt was 30 minutes and more than 50% was on counseling and direct patient cares.     Sullivan Lone MD MS AAHIVMS [facility] Hematology/Oncology Physician [facility]  (Office):       [phone] (Work cell):  [phone] (Fax):           [phone]  11/20/2017 3:18 PM  I, Baldwin [place], am acting as a scribe for Dr. Irene Limbo  .I have reviewed the above documentation for accuracy and completeness, and I agree with the above. Brunetta Genera MD

## 2017-11-20 ENCOUNTER — Inpatient Hospital Stay: Payer: BLUE CROSS/BLUE SHIELD | Attending: Hematology | Admitting: Hematology

## 2017-11-20 ENCOUNTER — Telehealth: Payer: Self-pay

## 2017-11-20 VITALS — BP 137/82 | HR 87 | Temp 98.4°F | Resp 18 | Ht 72.0 in | Wt 286.8 lb

## 2017-11-20 DIAGNOSIS — C801 Malignant (primary) neoplasm, unspecified: Secondary | ICD-10-CM

## 2017-11-20 DIAGNOSIS — N400 Enlarged prostate without lower urinary tract symptoms: Secondary | ICD-10-CM

## 2017-11-20 DIAGNOSIS — C6290 Malignant neoplasm of unspecified testis, unspecified whether descended or undescended: Secondary | ICD-10-CM | POA: Diagnosis not present

## 2017-11-20 DIAGNOSIS — C786 Secondary malignant neoplasm of retroperitoneum and peritoneum: Secondary | ICD-10-CM | POA: Insufficient documentation

## 2017-11-20 DIAGNOSIS — R740 Nonspecific elevation of levels of transaminase and lactic acid dehydrogenase [LDH]: Secondary | ICD-10-CM | POA: Diagnosis not present

## 2017-11-20 DIAGNOSIS — R590 Localized enlarged lymph nodes: Secondary | ICD-10-CM

## 2017-11-20 NOTE — Telephone Encounter (Signed)
Printed avs and calender of upcoming appointment. Per 8/8 los 

## 2017-11-24 ENCOUNTER — Other Ambulatory Visit: Payer: Self-pay | Admitting: Student

## 2017-11-25 ENCOUNTER — Ambulatory Visit (HOSPITAL_COMMUNITY)
Admission: RE | Admit: 2017-11-25 | Discharge: 2017-11-25 | Disposition: A | Discharge status: Home / Self Care | Payer: BLUE CROSS/BLUE SHIELD | Source: Ambulatory Visit | Attending: Hematology | Admitting: Hematology

## 2017-11-25 ENCOUNTER — Encounter (HOSPITAL_COMMUNITY): Payer: Self-pay

## 2017-11-25 ENCOUNTER — Other Ambulatory Visit: Payer: Self-pay

## 2017-11-25 DIAGNOSIS — C8511 Unspecified B-cell lymphoma, lymph nodes of head, face, and neck: Secondary | ICD-10-CM | POA: Diagnosis not present

## 2017-11-25 DIAGNOSIS — C770 Secondary and unspecified malignant neoplasm of lymph nodes of head, face and neck: Secondary | ICD-10-CM | POA: Diagnosis not present

## 2017-11-25 DIAGNOSIS — R590 Localized enlarged lymph nodes: Secondary | ICD-10-CM | POA: Diagnosis not present

## 2017-11-25 DIAGNOSIS — C8590 Non-Hodgkin lymphoma, unspecified, unspecified site: Secondary | ICD-10-CM | POA: Diagnosis not present

## 2017-11-25 DIAGNOSIS — C6290 Malignant neoplasm of unspecified testis, unspecified whether descended or undescended: Secondary | ICD-10-CM | POA: Diagnosis not present

## 2017-11-25 HISTORY — DX: Type 2 diabetes mellitus without complications: E11.9

## 2017-11-25 LAB — PROTIME-INR
INR: 0.97
Prothrombin Time: 12.8 seconds (ref 11.4–15.2)

## 2017-11-25 LAB — CBC
HEMATOCRIT: 48.1 % (ref 39.0–52.0)
HEMOGLOBIN: 16 g/dL (ref 13.0–17.0)
MCH: 28.3 pg (ref 26.0–34.0)
MCHC: 33.3 g/dL (ref 30.0–36.0)
MCV: 85 fL (ref 78.0–100.0)
Platelets: 264 10*3/uL (ref 150–400)
RBC: 5.66 MIL/uL (ref 4.22–5.81)
RDW: 13.5 % (ref 11.5–15.5)
WBC: 7.9 10*3/uL (ref 4.0–10.5)

## 2017-11-25 LAB — APTT: aPTT: 27 seconds (ref 24–36)

## 2017-11-25 LAB — GLUCOSE, CAPILLARY: Glucose-Capillary: 150 mg/dL — ABNORMAL HIGH (ref 70–99)

## 2017-11-25 MED ORDER — FENTANYL CITRATE (PF) 100 MCG/2ML IJ SOLN
INTRAMUSCULAR | Status: AC | PRN
  Administered 2017-11-25 (×2): 50 ug via INTRAVENOUS

## 2017-11-25 MED ORDER — MIDAZOLAM HCL 2 MG/2ML IJ SOLN
INTRAMUSCULAR | Status: AC | PRN
  Administered 2017-11-25 (×2): 1 mg via INTRAVENOUS

## 2017-11-25 MED ORDER — FENTANYL CITRATE (PF) 100 MCG/2ML IJ SOLN
INTRAMUSCULAR | Status: AC
  Filled 2017-11-25: qty 4

## 2017-11-25 MED ORDER — SODIUM CHLORIDE 0.9 % IV SOLN
INTRAVENOUS | Status: DC
  Administered 2017-11-25: 08:00:00 via INTRAVENOUS

## 2017-11-25 MED ORDER — FLUMAZENIL 0.5 MG/5ML IV SOLN
INTRAVENOUS | Status: AC
  Filled 2017-11-25: qty 5

## 2017-11-25 MED ORDER — LIDOCAINE HCL (PF) 1 % IJ SOLN
INTRAMUSCULAR | Status: AC | PRN
  Administered 2017-11-25: 15 mL

## 2017-11-25 MED ORDER — NALOXONE HCL 0.4 MG/ML IJ SOLN
INTRAMUSCULAR | Status: AC
  Filled 2017-11-25: qty 1

## 2017-11-25 MED ORDER — MIDAZOLAM HCL 2 MG/2ML IJ SOLN
INTRAMUSCULAR | Status: AC
  Filled 2017-11-25: qty 4

## 2017-11-25 NOTE — H&P (Signed)
Chief Complaint: Lymphadenopathy  Referring Physician(s): Brunetta Genera  Supervising Physician: Daryll Brod  Patient Status: [facility] Out[facility]  History of Present Illness: Neil Berg is a 57 y.o. male who is here today for biopsy of a para-aortic lymph node.  Per review of his chart, he woke up on the morning of 10/17/17 with significant back and kidney pain.  The pain radiated around to the RUQ of his abdomen as the morning progressed.   He notes that his pain went away after taking pain medications for 5 days.  This prompted a workup which included CT scan which was done on 10/14/2017.  CT revealed retroperitoneal lymphadenopathy, highly suspicious for lymphoma or metastatic disease.  PET was then obtained on 11/17/2017 which revealed two adjacent left-sided retroperitoneal lymph nodes on image number 132 are hypermetabolic with SUV max of 44.01. The larger more lateral node measures 2.1 cm and previously measured 1.8 cm.  He is NPO. He denies fever/chills, n/v. He does have some mild back pain today he rates a 2/10. He otherwise feels well, ROS is negative.  PMHx includes DM and hypertension.   Past Medical History:  Diagnosis Date  . Chest pain   . Diabetes mellitus without complication ([place])   . Hypertension   . Obesity     Past Surgical History:  Procedure Laterality Date  . EYE SURGERY     left eye cataract removal / implant   . TONSILLECTOMY      Allergies: Patient has no known allergies.  Medications: Prior to Admission medications   Medication Sig Start Date End Date Taking? Authorizing Provider  amLODipine (NORVASC) 5 MG tablet Take 1 tablet (5 mg total) by mouth daily. 06/24/14  Yes Jerline Pain, MD  aspirin EC 81 MG tablet Take 81 mg by mouth daily.   Yes [provider]  b complex vitamins tablet Take 1 tablet by mouth daily.   Yes [provider]  HYDROcodone-acetaminophen (NORCO/VICODIN) 5-325 MG tablet Take 1 tablet by  mouth every 6 (six) hours as needed for moderate pain or severe pain. 10/23/17  Yes Jaynee Eagles, PA-C  JARDIANCE 25 MG TABS tablet TK 1 T PO QAM 08/28/17  Yes [provider]  lisinopril-hydrochlorothiazide (PRINZIDE,ZESTORETIC) 20-12.5 MG tablet Take 1 tablet by mouth daily. 06/23/15  Yes Jerline Pain, MD     Family History  Problem Relation Age of Onset  . Heart disease Maternal Grandmother   . Heart attack Maternal Grandfather     Social History   Socioeconomic History  . Marital status: Married    Spouse name: Not on file  . Number of children: Not on file  . Years of education: Not on file  . Highest education level: Not on file  Occupational History  . Not on file  Social Needs  . Financial resource strain: Not on file  . Food insecurity:    Worry: Not on file    Inability: Not on file  . Transportation needs:    Medical: Not on file    Non-medical: Not on file  Tobacco Use  . Smoking status: Never Smoker  . Smokeless tobacco: Never Used  Substance and Sexual Activity  . Alcohol use: No  . Drug use: No  . Sexual activity: Not on file  Lifestyle  . Physical activity:    Days per week: Not on file    Minutes per session: Not on file  . Stress: Not on file  Relationships  .  Social connections:    Talks on phone: Not on file    Gets together: Not on file    Attends religious service: Not on file    Active member of club or organization: Not on file    Attends meetings of clubs or organizations: Not on file    Relationship status: Not on file  Other Topics Concern  . Not on file  Social History Narrative  . Not on file     Review of Systems: A 12 point ROS discussed and pertinent positives are indicated in the HPI above.  All other systems are negative. Review of Systems  Vital Signs: BP (!) 149/87 (BP Location: Right Arm)   Pulse 86   Temp 98.4 F (36.9 C) (Oral)   Resp 16   SpO2 95%   Physical Exam  Constitutional: He is oriented to  person, place, and time. He appears well-developed.  HENT:  Head: Normocephalic and atraumatic.  Eyes: EOM are normal.  Neck: Normal range of motion.  Cardiovascular: Normal rate, regular rhythm and normal heart sounds.  Pulmonary/Chest: Effort normal and breath sounds normal. No respiratory distress.  Abdominal: Soft. He exhibits no distension. There is no tenderness.  Musculoskeletal: Normal range of motion.  Neurological: He is alert and oriented to person, place, and time.  Skin: Skin is warm and dry.  Psychiatric: He has a normal mood and affect. His behavior is normal. Judgment and thought content normal.  Vitals reviewed.   Imaging: Nm Pet Image Initial (pi) Skull Base To Thigh  Result Date: 11/17/2017 CLINICAL DATA:  Initial treatment strategy for lymphadenopathy. EXAM: NUCLEAR MEDICINE PET SKULL BASE TO THIGH TECHNIQUE: 13.9 mCi F-18 FDG was injected intravenously. Full-ring PET imaging was performed from the skull base to thigh after the radiotracer. CT data was obtained and used for attenuation correction and anatomic localization. Fasting blood glucose: 144 mg/dl COMPARISON:  CT scan 10/14/2017 FINDINGS: Mediastinal blood pool activity: SUV max 2.64 NECK: No hypermetabolic lymph nodes in the neck. Incidental CT findings: none CHEST: No hypermetabolic mediastinal or hilar nodes. No suspicious pulmonary nodules on the CT scan. Incidental CT findings: Densely calcified lesion in the subcutaneous fat in the lower left thorax posteriorly likely due to remote trauma. ABDOMEN/PELVIS: Hypermetabolic retroperitoneal lymphadenopathy. 15.5 mm right-sided retroperitoneal lymph node posterior to the IVC on image number 136 has an SUV max of 14.19. This measured 12 mm on the prior CT scan. 3 cm left-sided necrotic appearing retroperitoneal lymph node previously measured 4.5 cm. It contains a small focus of hypermetabolism with SUV max of 5.5. Two adjacent left-sided retroperitoneal lymph nodes on  image number 588 are hypermetabolic with SUV max of 50.27. The larger more lateral node measures 2.1 cm and previously measured 1.8 cm. The solid abdominal organs are unremarkable. No hepatic or splenic lesions or splenomegaly. No pelvic or inguinal lymphadenopathy. No obvious testicular or prostate lesions. Incidental CT findings: Enlarged prostate gland. Age advanced scattered atherosclerotic aortic and iliac artery calcifications. SKELETON: No focal hypermetabolic activity to suggest skeletal metastasis. Incidental CT findings: none IMPRESSION: 1. Isolated hypermetabolic retroperitoneal lymphadenopathy. Findings suspicious for lymphoma as no primary lesion is identified to suggest this is metastatic adenopathy. Recommend retroperitoneal biopsy. The largest left-sided node appears necrotic and is actually decreased in size since the prior CT scan. It appeared inflamed on the prior study and may have infarcted. I would not recommend biopsying this lesion. 2. No adenopathy in the neck, axilla, chest, pelvis or inguinal regions. 3. No worrisome pulmonary  lesions or osseous lesions. Electronically Signed   By: Marijo Sanes M.D.   On: 11/17/2017 08:50    Labs:  CBC: Recent Labs    10/14/17 1517 10/17/17 1644 10/29/17 1202 11/25/17 0712  WBC 16.4* 13.9* 10.6* 7.9  HGB 15.5 15.9 15.0 16.0  HCT 46.0 47.8 45.5 48.1  PLT 257 345 347 264    COAGS: No results for input(s): INR, APTT in the last 8760 hours.  BMP: Recent Labs    10/14/17 1812 10/29/17 1202  NA  --  137  K  --  4.2  CL  --  97*  CO2  --  30  GLUCOSE  --  116*  BUN  --  15  CALCIUM  --  9.9  CREATININE 0.50* 0.83  GFRNONAA  --  >60  GFRAA  --  >60    LIVER FUNCTION TESTS: Recent Labs    10/29/17 1202  BILITOT 0.4  AST 18  ALT 25  ALKPHOS 129*  PROT 8.3*  ALBUMIN 4.3    TUMOR MARKERS: Recent Labs    10/29/17 1202  CHROMGRNA <1    Assessment and Plan:  Two adjacent left-sided retroperitoneal lymph nodes on  image number 161 are hypermetabolic with SUV max of 09.60.   Will proceed with image guided biopsy today by Dr. Annamaria Boots.  Risks and benefits discussed with the patient including, but not limited to bleeding, infection, damage to adjacent structures or low yield requiring additional tests.  All of the patient's questions were answered, patient is agreeable to proceed. Consent signed and in chart.  Thank you for this interesting consult.  I greatly enjoyed meeting Shravan Salahuddin and look forward to participating in their care.  A copy of this report was sent to the requesting provider on this date.  Electronically Signed: Murrell Redden, PA-C   11/25/2017, 7:55 AM      I spent a total of  30 Minutes in face to face in clinical consultation, greater than 50% of which was counseling/coordinating care for lymph node biopsy.

## 2017-11-25 NOTE — Procedures (Signed)
Lymphoma  S/p CT bx left RP LN bx  No comp Stable EBL min Path pending Full report in pacs

## 2017-11-25 NOTE — Discharge Instructions (Addendum)
Lymphadenopathy Lymphadenopathy refers to swollen or enlarged lymph glands, also called lymph nodes. Lymph glands are part of your body's defense (immune) system, which protects the body from infections, germs, and diseases. Lymph glands are found in many locations in your body, including the neck, underarm, and groin. Many things can cause lymph glands to become enlarged. When your immune system responds to germs, such as viruses or bacteria, infection-fighting cells and fluid build up. This causes the glands to grow in size. Usually, this is not something to worry about. The swelling and any soreness often go away without treatment. However, swollen lymph glands can also be caused by a number of diseases. Your health care provider may do various tests to help determine the cause. If the cause of your swollen lymph glands cannot be found, it is important to monitor your condition to make sure the swelling goes away. Follow these instructions at home: Watch your condition for any changes. The following actions may help to lessen any discomfort you are feeling:  Get plenty of rest.  Take medicines only as directed by your health care provider. Your health care provider may recommend over-the-counter medicines for pain.  Apply moist heat compresses to the site of swollen lymph nodes as directed by your health care provider. This can help reduce any pain.  Check your lymph nodes daily for any changes.  Keep all follow-up visits as directed by your health care provider. This is important.  Contact a health care provider if:  Your lymph nodes are still swollen after 2 weeks.  Your swelling increases or spreads to other areas.  Your lymph nodes are hard, seem fixed to the skin, or are growing rapidly.  Your skin over the lymph nodes is red and inflamed.  You have a fever.  You have chills.  You have fatigue.  You develop a sore throat.  You have abdominal pain.  You have weight  loss.  You have night sweats. Get help right away if:  You notice fluid leaking from the area of the enlarged lymph node.  You have severe pain in any area of your body.  You have chest pain.  You have shortness of breath. This information is not intended to replace advice given to you by your health care provider. Make sure you discuss any questions you have with your health care provider. Document Released: 01/09/2008 Document Revised: 09/07/2015 Document Reviewed: 11/04/2013  Elsevier Interactive Patient Education  2018 [employer]. Moderate Conscious Sedation, Adult, Care After These instructions provide you with information about caring for yourself after your procedure. Your health care provider may also give you more specific instructions. Your treatment has been planned according to current medical practices, but problems sometimes occur. Call your health care provider if you have any problems or questions after your procedure. What can I expect after the procedure? After your procedure, it is common:  To feel sleepy for several hours.  To feel clumsy and have poor balance for several hours.  To have poor judgment for several hours.  To vomit if you eat too soon.  Follow these instructions at home: For at least 24 hours after the procedure:   Do not: ? Participate in activities where you could fall or become injured. ? Drive. ? Use heavy machinery. ? Drink alcohol. ? Take sleeping pills or medicines that cause drowsiness. ? Make important decisions or sign legal documents. ? Take care of children on your own.  Rest. Eating and drinking  Follow  the diet recommended by your health care provider.  If you vomit: ? Drink water, juice, or soup when you can drink without vomiting. ? Make sure you have little or no nausea before eating solid foods. General instructions  Have a responsible adult stay with you until you are awake and alert.  Take over-the-counter  and prescription medicines only as told by your health care provider.  If you smoke, do not smoke without supervision.  Keep all follow-up visits as told by your health care provider. This is important. Contact a health care provider if:  You keep feeling nauseous or you keep vomiting.  You feel light-headed.  You develop a rash.  You have a fever. Get help right away if:  You have trouble breathing. This information is not intended to replace advice given to you by your health care provider. Make sure you discuss any questions you have with your health care provider. Document Released: 01/20/2013 Document Revised: 09/04/2015 Document Reviewed: 07/22/2015 Elsevier Interactive Patient Education  2018 [employer].    Needle Biopsy, Care After These instructions give you information about caring for yourself after your procedure. Your doctor may also give you more specific instructions. Call your doctor if you have any problems or questions after your procedure. Follow these instructions at home:  Rest as told by your doctor.  Take medicines only as told by your doctor.  There are many different ways to close and cover the biopsy site, including stitches (sutures), skin glue, and adhesive strips. Follow instructions from your doctor about: ? How to take care of your biopsy site. ? When and how you should change your bandage (dressing). May remove dressing and shower in 24 hours. Keep site clean and dry. May use bandaid to replace initial dressing as needed.  ? When you should remove your dressing. ? Removing whatever was used to close your biopsy site.  Check your biopsy site every day for signs of infection. Watch for: ? Redness, swelling, or pain. ? Fluid, blood, or pus. Contact a doctor if:  You have a fever.  You have redness, swelling, or pain at the biopsy site, and it lasts longer than a few days.  You have fluid, blood, or pus coming from the biopsy site.  You  feel sick to your stomach (nauseous).  You throw up (vomit). Get help right away if:  You are short of breath.  You have trouble breathing.  Your chest hurts.  You feel dizzy or you pass out (faint).  You have bleeding that does not stop with pressure or a bandage.  You cough up blood.  Your belly (abdomen) hurts. This information is not intended to replace advice given to you by your health care provider. Make sure you discuss any questions you have with your health care provider. Document Released: 03/14/2008 Document Revised: 09/07/2015 Document Reviewed: 03/28/2014 Elsevier Interactive Patient Education  [employer].

## 2017-11-28 ENCOUNTER — Telehealth: Payer: Self-pay | Admitting: Medical Oncology

## 2017-11-28 ENCOUNTER — Telehealth: Payer: Self-pay | Admitting: *Deleted

## 2017-11-28 NOTE — Telephone Encounter (Signed)
Last night he experienced increased bilateral flank pain 6/10 - at bx site. L> R.This pain radiates to the front and he said it is  " same pain I had when this whole process started" . He took hydrocodone overlapped 2 doses, and tylenol 500 mg. This did not relieve pain and he did not sleep.  Denies SOB or any other symptoms.  Message to Dr. Irene Limbo.

## 2017-11-28 NOTE — Telephone Encounter (Signed)
Discussed concerns from call earlier in the day regarding worsening unresolved pain in the area near the bx site.  Per Dr Irene Limbo the complaints are not usual from biopsy and if the pain medication that the patient has not resolved the pain then he should seek medical attention at the emergency room.  The patient states that it is worse at night and describes it similar to when he was originally having problems and does not want to go to the emergency room at  This point.  Explained his options for ER and if come Monday he still is having issues then he could try to get appt here with [facility].  He expressed understanding.

## 2017-12-01 ENCOUNTER — Encounter: Payer: Self-pay | Admitting: Hematology

## 2017-12-02 NOTE — Progress Notes (Signed)
HEMATOLOGY/ONCOLOGY CONSULTATION NOTE  Date of Service: 12/03/2017  Patient Care Team: Antony Contras, MD as PCP - General (Family Medicine)  CHIEF COMPLAINTS/PURPOSE OF CONSULTATION:  Lymphadenopathy   HISTORY OF PRESENTING ILLNESS:   Neil Berg is a wonderful 57 y.o. male who has been referred to [place] by Dr. Antony Contras for evaluation and management of Lymphadenopathy. He is accompanied today by his wife. The pt reports that he is doing well overall and [place]   The pt reports that he hasn't had any pain in over a week and recently appeared to [facility] on 10/17/17. He notes that he woke up on the morning of 10/17/17 with significant, unique, back and kidney pain which began to spread around to the RUQ of his abdomen as the morning progressed. He notes that his pain went away after taking pain medications for 5 days, and describes that the pain was waxing and waning. He also endorses some positional exacerbation to his pain. He denies any nausea, vomiting, diarrhea, changes in bowel habits, testicular pain or swelling, difficulty/changes in urination at the time or since then.    The pt also notes that  In the week of these symptoms he felt very cold, which is different for him as he describes himself as very warm natured. He also notes that his appetite weakened and he lost about 10 pounds, and was abiding by the [employer]. He notes that his appetite has returned and he is eating normally again. He denies any pain associated with meals. He denies recent exotic or international travel  He takes Jardiance for his DM. He notes that his PSA spiked two years ago, had a biopsy, and his PSA counts have decreased recently and is believed to be BPH.    He also notes that he had a recurring rash on his calves, worse on the left, that was red and flat, mimicking his sock impression, but extending further up his leg. He notes that this happened over 3  months, and that it came and went without any discernable pattern. He denies any associated itching.   He also notes a place on the front of his left lower leg that has been slightly swollen for the past a week. He denies remembering running into anything, bruising, or pain.   Of note prior to the patient's visit today, pt has had CT A/P completed on 10/14/17 with results revealing Retroperitoneal lymphadenopathy, highly suspicious for lymphoma or metastatic disease. 2. Prostate enlargement 3.  Aortic Atherosclerosis.   Most recent lab results (10/17/17) of CBC w/diff is as follows: all values are WNL except for WBC at 13.9k, RBC at 5.83, ANC at 10.0k, Monocytes abs at 1.4k.  On review of systems, pt reports recent back pain, feeling cold recently, recent abdominal pain, and denies nausea, vomiting, diarrhea, changes in bowel habits, difficulty/changes in urination, blood in the urine, abdominal trauma, constipation, fevers, night sweats, chills, testicular pain or swelling, noticing any lumps or bumps, unexpected weight loss, and any other symptoms.   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  On Social Hx the pt reports working in [employer] and denies chemical/radiaiton exposure. He denies ever smoking and other drug use.  Interval History:   Neil Berg returns today regarding his retroperitoneal lymphadenopathy. The patient's last visit with [place] was  on 11/20/17. He is accompanied today by his wife and daughter. The pt reports that he is doing well overall.   The pt reports that in the evening he feels like his kidneys are being squeezed, after the biopsy with pain between 7-8. He notes that his pain reduced in the last two days greatly. He denies pain in the spine itself. He also describes the pain as wrapping around the abdomen.   Of note since the patient's last  visit, pt has had LN biopsy completed on 11/25/17 with results revealing metastatic seminoma -prelim result.  Lab results today (11/25/17) of CBC w/diff is as follows: all values are WNL except.  On review of systems, pt reports stable energy levels, and denies testicular pain or swelling, testicular swelling, and any other symptoms.   MEDICAL HISTORY:  Past Medical History:  Diagnosis Date  . Chest pain   . Diabetes mellitus without complication ([place])   . Hypertension   . Obesity   Type II Diabetes Mellitus   SURGICAL HISTORY: Past Surgical History:  Procedure Laterality Date  . EYE SURGERY     left eye cataract removal / implant   . TONSILLECTOMY      Tonsillectomy 1974 Cataract surgery-left eye 04/2015 Colonoscopy 06/04/16  SOCIAL HISTORY: Social History   Socioeconomic History  . Marital status: Married    Spouse name: Not on file  . Number of children: Not on file  . Years of education: Not on file  . Highest education level: Not on file  Occupational History  . Not on file  Social Needs  . Financial resource strain: Not on file  . Food insecurity:    Worry: Not on file    Inability: Not on file  . Transportation needs:    Medical: Not on file    Non-medical: Not on file  Tobacco Use  . Smoking status: Never Smoker  . Smokeless tobacco: Never Used  Substance and Sexual Activity  . Alcohol use: No  . Drug use: No  . Sexual activity: Not on file  Lifestyle  . Physical activity:    Days per week: Not on file    Minutes per session: Not on file  . Stress: Not on file  Relationships  . Social connections:    Talks on phone: Not on file    Gets together: Not on file    Attends religious service: Not on file    Active member of club or organization: Not on file    Attends meetings of clubs or organizations: Not on file    Relationship status: Not on file  . Intimate partner violence:    Fear of current or ex partner: Not on file    Emotionally abused: Not  on file    Physically abused: Not on file    Forced sexual activity: Not on file  Other Topics Concern  . Not on file  Social History Narrative  . Not on file    FAMILY HISTORY: Family History  Problem Relation Age of Onset  . Heart disease Maternal Grandmother   . Heart attack Maternal Grandfather     ALLERGIES:  has No Known Allergies.  MEDICATIONS:  Current Outpatient Medications  Medication Sig Dispense Refill  . amLODipine (NORVASC) 5 MG tablet Take 1 tablet (5 mg total) by mouth daily. 30 tablet 11  . aspirin EC 81 MG tablet Take 81 mg by mouth daily.    Marland Kitchen b complex vitamins tablet Take 1 tablet by mouth  daily.    Marland Kitchen HYDROcodone-acetaminophen (NORCO/VICODIN) 5-325 MG tablet Take 1 tablet by mouth every 6 (six) hours as needed for moderate pain or severe pain. 30 tablet 0  . JARDIANCE 25 MG TABS tablet TK 1 T PO QAM  0  . lisinopril-hydrochlorothiazide (PRINZIDE,ZESTORETIC) 20-12.5 MG tablet Take 1 tablet by mouth daily. 30 tablet 0   No current facility-administered medications for this visit.     REVIEW OF SYSTEMS:    A 10+ POINT REVIEW OF SYSTEMS WAS OBTAINED including neurology, dermatology, psychiatry, cardiac, respiratory, lymph, extremities, GI, GU, Musculoskeletal, constitutional, breasts, reproductive, HEENT.  All pertinent positives are noted in the HPI.  All others are negative.   PHYSICAL EXAMINATION: ECOG PERFORMANCE STATUS: 1 - Symptomatic but completely ambulatory  . Vitals:   12/03/17 1532  BP: 140/77  Pulse: 90  Resp: 18  Temp: 98.1 F (36.7 C)  SpO2: 97%   Filed Weights   12/03/17 1532  Weight: 287 lb 6.4 oz (130.4 kg)   .Body mass index is 38.98 kg/m.  GENERAL:alert, in no acute distress and comfortable SKIN: no acute rashes, no significant lesions EYES: conjunctiva are pink and non-injected, sclera anicteric OROPHARYNX: MMM, no exudates, no oropharyngeal erythema or ulceration NECK: supple, no JVD LYMPH:  no palpable lymphadenopathy  in the cervical, axillary or inguinal regions LUNGS: clear to auscultation b/l with normal respiratory effort HEART: regular rate & rhythm ABDOMEN:  normoactive bowel sounds , non tender, not distended. No palpable hepatosplenomegaly. TESTES: no obvious palpable testicular mass  Extremity: no pedal edema PSYCH: alert & oriented x 3 with fluent speech NEURO: no focal motor/sensory deficits   LABORATORY DATA:  I have reviewed the data as listed  . CBC Latest Ref Rng & Units 11/25/2017 10/29/2017 10/17/2017  WBC 4.0 - 10.5 K/uL 7.9 10.6(H) 13.9(H)  Hemoglobin 13.0 - 17.0 g/dL 16.0 15.0 15.9  Hematocrit 39.0 - 52.0 % 48.1 45.5 47.8  Platelets 150 - 400 K/uL 264 347 345    . CMP Latest Ref Rng & Units 10/29/2017 10/14/2017 09/26/2013  Glucose 70 - 99 mg/dL 116(H) - 155(H)  BUN 6 - 20 mg/dL 15 - 9  Creatinine 0.61 - 1.24 mg/dL 0.83 0.50(L) 0.52  Sodium 135 - 145 mmol/L 137 - 143  Potassium 3.5 - 5.1 mmol/L 4.2 - 4.0  Chloride 98 - 111 mmol/L 97(L) - 99  CO2 22 - 32 mmol/L 30 - 26  Calcium 8.9 - 10.3 mg/dL 9.9 - 9.4  Total Protein 6.5 - 8.1 g/dL 8.3(H) - -  Total Bilirubin 0.3 - 1.2 mg/dL 0.4 - -  Alkaline Phos 38 - 126 U/L 129(H) - -  AST 15 - 41 U/L 18 - -  ALT 0 - 44 U/L 25 - -    Pathology:   11/25/17 Tissue Flow Cytometry:    RADIOGRAPHIC STUDIES: I have personally reviewed the radiological images as listed and agreed with the findings in the report. Nm Pet Image Initial (pi) Skull Base To Thigh  Result Date: 11/17/2017 CLINICAL DATA:  Initial treatment strategy for lymphadenopathy. EXAM: NUCLEAR MEDICINE PET SKULL BASE TO THIGH TECHNIQUE: 13.9 mCi F-18 FDG was injected intravenously. Full-ring PET imaging was performed from the skull base to thigh after the radiotracer. CT data was obtained and used for attenuation correction and anatomic localization. Fasting blood glucose: 144 mg/dl COMPARISON:  CT scan 10/14/2017 FINDINGS: Mediastinal blood pool activity: SUV max 2.64 NECK: No  hypermetabolic lymph nodes in the neck. Incidental CT findings: none CHEST: No hypermetabolic  mediastinal or hilar nodes. No suspicious pulmonary nodules on the CT scan. Incidental CT findings: Densely calcified lesion in the subcutaneous fat in the lower left thorax posteriorly likely due to remote trauma. ABDOMEN/PELVIS: Hypermetabolic retroperitoneal lymphadenopathy. 15.5 mm right-sided retroperitoneal lymph node posterior to the IVC on image number 136 has an SUV max of 14.19. This measured 12 mm on the prior CT scan. 3 cm left-sided necrotic appearing retroperitoneal lymph node previously measured 4.5 cm. It contains a small focus of hypermetabolism with SUV max of 5.5. Two adjacent left-sided retroperitoneal lymph nodes on image number 308 are hypermetabolic with SUV max of 65.78. The larger more lateral node measures 2.1 cm and previously measured 1.8 cm. The solid abdominal organs are unremarkable. No hepatic or splenic lesions or splenomegaly. No pelvic or inguinal lymphadenopathy. No obvious testicular or prostate lesions. Incidental CT findings: Enlarged prostate gland. Age advanced scattered atherosclerotic aortic and iliac artery calcifications. SKELETON: No focal hypermetabolic activity to suggest skeletal metastasis. Incidental CT findings: none IMPRESSION: 1. Isolated hypermetabolic retroperitoneal lymphadenopathy. Findings suspicious for lymphoma as no primary lesion is identified to suggest this is metastatic adenopathy. Recommend retroperitoneal biopsy. The largest left-sided node appears necrotic and is actually decreased in size since the prior CT scan. It appeared inflamed on the prior study and may have infarcted. I would not recommend biopsying this lesion. 2. No adenopathy in the neck, axilla, chest, pelvis or inguinal regions. 3. No worrisome pulmonary lesions or osseous lesions. Electronically Signed   By: Marijo Sanes M.D.   On: 11/17/2017 08:50   Ct Biopsy  Result Date:  11/25/2017 INDICATION: Lymphoma, enlarging metabolic left retroperitoneal para-aortic lymph node EXAM: CT BIOPSY LEFT PERIAORTIC ADENOPATHY MEDICATIONS: 1% LIDOCAINE LOCAL ANESTHESIA/SEDATION: Moderate (conscious) sedation was employed during this procedure. A total of Versed 2.0 mg and Fentanyl 100 mcg was administered intravenously. Moderate Sedation Time: 15 minutes. The patient's level of consciousness and vital signs were monitored continuously by radiology nursing throughout the procedure under my direct supervision. FLUOROSCOPY TIME:  Fluoroscopy Time: NONE. COMPLICATIONS: None immediate. PROCEDURE: Informed written consent was obtained from the patient after a thorough discussion of the procedural risks, benefits and alternatives. All questions were addressed. Maximal Sterile Barrier Technique was utilized including caps, mask, sterile gowns, sterile gloves, sterile drape, hand hygiene and skin antiseptic. A timeout was performed prior to the initiation of the procedure. Previous imaging reviewed. Patient positioned prone. Noncontrast localization CT performed. The left periaortic adenopathy just inferior to the left renal artery was localized and correlated with the PET scan. Overlying skin marked for a posterior paraspinous approach. Under sterile conditions and local anesthesia, a 17 gauge 16.8 cm access needle was advanced from a posterior approach to the lymph node. 18 gauge core biopsies obtained. Samples placed in saline. Postprocedure imaging demonstrates no hemorrhage or hematoma. Patient tolerated the biopsy well. IMPRESSION: Successful CT-guided left periaortic adenopathy 18 gauge core biopsies Electronically Signed   By: Jerilynn Mages.  Shick M.D.   On: 11/25/2017 10:04    ASSESSMENT & PLAN:   57 y.o. male with  1. 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 Prostate enlargement.  11/17/17 PET/CT revealed Isolated hypermetabolic retroperitoneal lymphadenopathy. Findings suspicious for lymphoma as no primary lesion is identified to suggest this is metastatic adenopathy. Recommend retroperitoneal biopsy. The largest left-sided node appears necrotic and is actually decreased in size since the prior CT scan. It appeared inflamed on  the prior study and may have infarcted. I would not recommend biopsying this lesion. No adenopathy in the neck, axilla, chest, pelvis or inguinal regions. No worrisome pulmonary lesions or osseous lesions.  PLAN:  -Discussed pt labwork from 11/25/17; all blood counts were WNL -Discussed the 11/25/17 pathology results which revealed preliminary finding of metastatic seminoma  -Physical examination of the testes was unremarkable  -Will order US Scrotum to determine if this represents metastatic testicular seminoma vs extragonadal retroperitoneal seminoma -Discussed that the staging is Stage IIB S1 LDH elevated but <1.5X ULN and normal AFP and HCG -Will refer pt to IR for port a cath placement  -Will see the pt back in one week   Component     Latest Ref Rng & Units 10/29/2017  Chromogranin A     0 - 5 nmol/L <1  hCG Quant     0 - 3 mIU/mL 2  LDH     98 - 192 U/L 235 (H)  CEA ([facility])     0.00 - 5.00 ng/mL 1.84  AFP, Serum, Tumor Marker     0.0 - 8.3 ng/mL 2.6  CA 19-9     0 - 35 U/mL 13     US scrotum in tommorrow Port a cath placement by IR RTC with Dr Irene Limbo on 12/08/2017 at 3:20pm    All of the patients questions were answered with apparent satisfaction. The patient knows to call the clinic with any problems, questions or concerns.  The total time spent in the appt was 25 minutes and more than 50% was on counseling and direct patient cares.     Sullivan Lone MD MS AAHIVMS [facility] Hematology/Oncology Physician [facility]  (Office):       [phone] (Work cell):   [phone] (Fax):           [phone]  12/03/2017 4:13 PM  I, Baldwin [place], am acting as a scribe for Dr. Irene Limbo  .I have reviewed the above documentation for accuracy and completeness, and I agree with the above.

## 2017-12-03 ENCOUNTER — Inpatient Hospital Stay (HOSPITAL_BASED_OUTPATIENT_CLINIC_OR_DEPARTMENT_OTHER): Payer: BLUE CROSS/BLUE SHIELD | Admitting: Hematology

## 2017-12-03 VITALS — BP 140/77 | HR 90 | Temp 98.1°F | Resp 18 | Ht 72.0 in | Wt 287.4 lb

## 2017-12-03 DIAGNOSIS — C786 Secondary malignant neoplasm of retroperitoneum and peritoneum: Secondary | ICD-10-CM

## 2017-12-03 DIAGNOSIS — N400 Enlarged prostate without lower urinary tract symptoms: Secondary | ICD-10-CM

## 2017-12-03 DIAGNOSIS — C6290 Malignant neoplasm of unspecified testis, unspecified whether descended or undescended: Secondary | ICD-10-CM

## 2017-12-03 DIAGNOSIS — R740 Nonspecific elevation of levels of transaminase and lactic acid dehydrogenase [LDH]: Secondary | ICD-10-CM | POA: Diagnosis not present

## 2017-12-03 DIAGNOSIS — R590 Localized enlarged lymph nodes: Secondary | ICD-10-CM

## 2017-12-04 ENCOUNTER — Ambulatory Visit (HOSPITAL_COMMUNITY)
Admission: RE | Admit: 2017-12-04 | Discharge: 2017-12-04 | Disposition: A | Discharge status: Home / Self Care | Payer: BLUE CROSS/BLUE SHIELD | Source: Ambulatory Visit | Attending: Hematology | Admitting: Hematology

## 2017-12-04 ENCOUNTER — Telehealth: Payer: Self-pay

## 2017-12-04 DIAGNOSIS — N503 Cyst of epididymis: Secondary | ICD-10-CM | POA: Diagnosis not present

## 2017-12-04 DIAGNOSIS — C6290 Malignant neoplasm of unspecified testis, unspecified whether descended or undescended: Secondary | ICD-10-CM | POA: Diagnosis not present

## 2017-12-04 NOTE — Telephone Encounter (Signed)
Returned visit scheduled already by Sharyne Peach. Per 8/21 los

## 2017-12-08 ENCOUNTER — Telehealth: Payer: Self-pay | Admitting: Hematology

## 2017-12-08 ENCOUNTER — Inpatient Hospital Stay (HOSPITAL_BASED_OUTPATIENT_CLINIC_OR_DEPARTMENT_OTHER): Payer: BLUE CROSS/BLUE SHIELD | Admitting: Hematology

## 2017-12-08 VITALS — BP 128/74 | HR 80 | Temp 98.5°F | Resp 18 | Ht 72.0 in | Wt 289.4 lb

## 2017-12-08 DIAGNOSIS — N400 Enlarged prostate without lower urinary tract symptoms: Secondary | ICD-10-CM | POA: Diagnosis not present

## 2017-12-08 DIAGNOSIS — Z5111 Encounter for antineoplastic chemotherapy: Secondary | ICD-10-CM

## 2017-12-08 DIAGNOSIS — R740 Nonspecific elevation of levels of transaminase and lactic acid dehydrogenase [LDH]: Secondary | ICD-10-CM | POA: Diagnosis not present

## 2017-12-08 DIAGNOSIS — C786 Secondary malignant neoplasm of retroperitoneum and peritoneum: Secondary | ICD-10-CM

## 2017-12-08 DIAGNOSIS — C6290 Malignant neoplasm of unspecified testis, unspecified whether descended or undescended: Secondary | ICD-10-CM | POA: Diagnosis not present

## 2017-12-08 NOTE — Telephone Encounter (Signed)
Chemo Education scheduled/ NO treatment plan in yet/ added to infusion log for treatment to being on 9/9 and advised patient that I would call him with schedule as soon as treatment time was given per 8/26 los

## 2017-12-08 NOTE — Progress Notes (Signed)
HEMATOLOGY/ONCOLOGY CLNIC NOTE  Date of Service: 12/10/2017  Patient Care Team: Antony Contras, MD as PCP - General (Family Medicine)  CHIEF COMPLAINTS/PURPOSE OF CONSULTATION:  Lymphadenopathy   HISTORY OF PRESENTING ILLNESS:   Neil Berg is a wonderful 57 y.o. male who has been referred to [place] by Dr. Antony Contras for evaluation and management of Lymphadenopathy. He is accompanied today by his wife. The pt reports that he is doing well overall and [place]   The pt reports that he hasn't had any pain in over a week and recently appeared to [facility] on 10/17/17. He notes that he woke up on the morning of 10/17/17 with significant, unique, back and kidney pain which began to spread around to the RUQ of his abdomen as the morning progressed. He notes that his pain went away after taking pain medications for 5 days, and describes that the pain was waxing and waning. He also endorses some positional exacerbation to his pain. He denies any nausea, vomiting, diarrhea, changes in bowel habits, testicular pain or swelling, difficulty/changes in urination at the time or since then.    The pt also notes that  In the week of these symptoms he felt very cold, which is different for him as he describes himself as very warm natured. He also notes that his appetite weakened and he lost about 10 pounds, and was abiding by the [employer]. He notes that his appetite has returned and he is eating normally again. He denies any pain associated with meals. He denies recent exotic or international travel  He takes Jardiance for his DM. He notes that his PSA spiked two years ago, had a biopsy, and his PSA counts have decreased recently and is believed to be BPH.    He also notes that he had a recurring rash on his calves, worse on the left, that was red and flat, mimicking his sock impression, but extending further up his leg. He notes that this happened over 3 months,  and that it came and went without any discernable pattern. He denies any associated itching.   He also notes a place on the front of his left lower leg that has been slightly swollen for the past a week. He denies remembering running into anything, bruising, or pain.   Of note prior to the patient's visit today, pt has had CT A/P completed on 10/14/17 with results revealing Retroperitoneal lymphadenopathy, highly suspicious for lymphoma or metastatic disease. 2. Prostate enlargement 3.  Aortic Atherosclerosis.   Most recent lab results (10/17/17) of CBC w/diff is as follows: all values are WNL except for WBC at 13.9k, RBC at 5.83, ANC at 10.0k, Monocytes abs at 1.4k.  On review of systems, pt reports recent back pain, feeling cold recently, recent abdominal pain, and denies nausea, vomiting, diarrhea, changes in bowel habits, difficulty/changes in urination, blood in the urine, abdominal trauma, constipation, fevers, night sweats, chills, testicular pain or swelling, noticing any lumps or bumps, unexpected weight loss, and any other symptoms.   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  On Social Hx the pt reports working in [employer] and denies chemical/radiaiton exposure. He denies ever smoking and other drug use.  Interval History:   Neil Berg returns today for management and evaluation of his newly diagnosed Stage II extra-gonadal  retroperitoneal Seminoma. The patient's last visit with [place] was on 12/03/17. He is accompanied today by his wife. The pt reports that he is doing well overall.   The pt reports that he has not developed any new pain, symptoms or concerns in the brief interim. His energy levels are stable, though compared to his previous baseline are decreased.   Of note since the patient's last visit, pt has had US Scrotum w/doppler completed on  12/04/17 with results revealing Negative scrotal ultrasound with no distinct testicular mass Identified. 2. 5 mm simple left epididymal cyst, almost certainly benign and felt to be incidental in nature.  On review of systems, pt reports some fatigue, controlled back/flank pain, and denies nausea, and any other symptoms.    MEDICAL HISTORY:  Past Medical History:  Diagnosis Date   Chest pain    Diabetes mellitus without complication ([place])    Hypertension    Obesity   Type II Diabetes Mellitus   SURGICAL HISTORY: Past Surgical History:  Procedure Laterality Date   EYE SURGERY     left eye cataract removal / implant    TONSILLECTOMY      Tonsillectomy 1974 Cataract surgery-left eye 04/2015 Colonoscopy 06/04/16  SOCIAL HISTORY: Social History   Socioeconomic History   Marital status: Married    Spouse name: Not on file   Number of children: Not on file   Years of education: Not on file   Highest education level: Not on file  Occupational History   Not on file  Social Needs   Financial resource strain: Not on file   Food insecurity:    Worry: Not on file    Inability: Not on file   Transportation needs:    Medical: Not on file    Non-medical: Not on file  Tobacco Use   Smoking status: Never Smoker   Smokeless tobacco: Never Used  Substance and Sexual Activity   Alcohol use: No   Drug use: No   Sexual activity: Not on file  Lifestyle   Physical activity:    Days per week: Not on file    Minutes per session: Not on file   Stress: Not on file  Relationships   Social connections:    Talks on phone: Not on file    Gets together: Not on file    Attends religious service: Not on file    Active member of club or organization: Not on file    Attends meetings of clubs or organizations: Not on file    Relationship status: Not on file   Intimate partner violence:    Fear of current or ex partner: Not on file    Emotionally abused: Not on file     Physically abused: Not on file    Forced sexual activity: Not on file  Other Topics Concern   Not on file  Social History Narrative   Not on file    FAMILY HISTORY: Family History  Problem Relation Age of Onset   Heart disease Maternal Grandmother    Heart attack Maternal Grandfather     ALLERGIES:  has No Known Allergies.  MEDICATIONS:  Current Outpatient Medications  Medication Sig Dispense Refill   amLODipine (NORVASC) 5 MG tablet Take 1 tablet (5 mg total) by mouth daily. 30 tablet 11   aspirin EC 81 MG tablet Take 81 mg by mouth daily.     b complex vitamins tablet Take 1 tablet by mouth daily.     dexamethasone (DECADRON) 4  MG tablet Take 2 tablets by mouth once a day on the day starting on day 6 of chemotherapy and then take 2 tablets two times a day for 2 days. 30 tablet 1   HYDROcodone-acetaminophen (NORCO/VICODIN) 5-325 MG tablet Take 1 tablet by mouth every 6 (six) hours as needed for moderate pain or severe pain. 30 tablet 0   JARDIANCE 25 MG TABS tablet TK 1 T PO QAM  0   lidocaine-prilocaine (EMLA) cream Apply to affected area once 30 g 3   lisinopril-hydrochlorothiazide (PRINZIDE,ZESTORETIC) 20-12.5 MG tablet Take 1 tablet by mouth daily. 30 tablet 0   LORazepam (ATIVAN) 0.5 MG tablet Take 1 tablet (0.5 mg total) by mouth every 6 (six) hours as needed (Nausea or vomiting). 30 tablet 0   ondansetron (ZOFRAN) 8 MG tablet Take 1 tablet (8 mg total) by mouth 2 (two) times daily as needed. Start on day 8 of chemotherapy. 30 tablet 1   prochlorperazine (COMPAZINE) 10 MG tablet Take 1 tablet (10 mg total) by mouth every 6 (six) hours as needed (Nausea or vomiting). 30 tablet 1   No current facility-administered medications for this visit.     REVIEW OF SYSTEMS:    A 10+ POINT REVIEW OF SYSTEMS WAS OBTAINED including neurology, dermatology, psychiatry, cardiac, respiratory, lymph, extremities, GI, GU, Musculoskeletal, constitutional, breasts, reproductive,  HEENT.  All pertinent positives are noted in the HPI.  All others are negative.   PHYSICAL EXAMINATION: ECOG PERFORMANCE STATUS: 1 - Symptomatic but completely ambulatory  . Vitals:   12/08/17 1539  BP: 128/74  Pulse: 80  Resp: 18  Temp: 98.5 F (36.9 C)  SpO2: 97%   Filed Weights   12/08/17 1539  Weight: 289 lb 6.4 oz (131.3 kg)   .Body mass index is 39.25 kg/m.  GENERAL:alert, in no acute distress and comfortable SKIN: no acute rashes, no significant lesions EYES: conjunctiva are pink and non-injected, sclera anicteric OROPHARYNX: MMM, no exudates, no oropharyngeal erythema or ulceration NECK: supple, no JVD LYMPH:  no palpable lymphadenopathy in the cervical, axillary or inguinal regions LUNGS: clear to auscultation b/l with normal respiratory effort HEART: regular rate & rhythm ABDOMEN:  normoactive bowel sounds , non tender, not distended. No palpable hepatosplenomegaly. TESTES: no obvious palpable testicular mass  Extremity: no pedal edema PSYCH: alert & oriented x 3 with fluent speech NEURO: no focal motor/sensory deficits   LABORATORY DATA:  I have reviewed the data as listed  . CBC Latest Ref Rng & Units 11/25/2017 10/29/2017 10/17/2017  WBC 4.0 - 10.5 K/uL 7.9 10.6(H) 13.9(H)  Hemoglobin 13.0 - 17.0 g/dL 16.0 15.0 15.9  Hematocrit 39.0 - 52.0 % 48.1 45.5 47.8  Platelets 150 - 400 K/uL 264 347 345    . CMP Latest Ref Rng & Units 10/29/2017 10/14/2017 09/26/2013  Glucose 70 - 99 mg/dL 116(H) - 155(H)  BUN 6 - 20 mg/dL 15 - 9  Creatinine 0.61 - 1.24 mg/dL 0.83 0.50(L) 0.52  Sodium 135 - 145 mmol/L 137 - 143  Potassium 3.5 - 5.1 mmol/L 4.2 - 4.0  Chloride 98 - 111 mmol/L 97(L) - 99  CO2 22 - 32 mmol/L 30 - 26  Calcium 8.9 - 10.3 mg/dL 9.9 - 9.4  Total Protein 6.5 - 8.1 g/dL 8.3(H) - -  Total Bilirubin 0.3 - 1.2 mg/dL 0.4 - -  Alkaline Phos 38 - 126 U/L 129(H) - -  AST 15 - 41 U/L 18 - -  ALT 0 - 44 U/L 25 - -  Pathology:   11/25/17 Tissue Flow  Cytometry:    RADIOGRAPHIC STUDIES: I have personally reviewed the radiological images as listed and agreed with the findings in the report. Nm Pet Image Initial (pi) Skull Base To Thigh  Result Date: 11/17/2017 CLINICAL DATA:  Initial treatment strategy for lymphadenopathy. EXAM: NUCLEAR MEDICINE PET SKULL BASE TO THIGH TECHNIQUE: 13.9 mCi F-18 FDG was injected intravenously. Full-ring PET imaging was performed from the skull base to thigh after the radiotracer. CT data was obtained and used for attenuation correction and anatomic localization. Fasting blood glucose: 144 mg/dl COMPARISON:  CT scan 10/14/2017 FINDINGS: Mediastinal blood pool activity: SUV max 2.64 NECK: No hypermetabolic lymph nodes in the neck. Incidental CT findings: none CHEST: No hypermetabolic mediastinal or hilar nodes. No suspicious pulmonary nodules on the CT scan. Incidental CT findings: Densely calcified lesion in the subcutaneous fat in the lower left thorax posteriorly likely due to remote trauma. ABDOMEN/PELVIS: Hypermetabolic retroperitoneal lymphadenopathy. 15.5 mm right-sided retroperitoneal lymph node posterior to the IVC on image number 136 has an SUV max of 14.19. This measured 12 mm on the prior CT scan. 3 cm left-sided necrotic appearing retroperitoneal lymph node previously measured 4.5 cm. It contains a small focus of hypermetabolism with SUV max of 5.5. Two adjacent left-sided retroperitoneal lymph nodes on image number 163 are hypermetabolic with SUV max of 84.53. The larger more lateral node measures 2.1 cm and previously measured 1.8 cm. The solid abdominal organs are unremarkable. No hepatic or splenic lesions or splenomegaly. No pelvic or inguinal lymphadenopathy. No obvious testicular or prostate lesions. Incidental CT findings: Enlarged prostate gland. Age advanced scattered atherosclerotic aortic and iliac artery calcifications. SKELETON: No focal hypermetabolic activity to suggest skeletal metastasis.  Incidental CT findings: none IMPRESSION: 1. Isolated hypermetabolic retroperitoneal lymphadenopathy. Findings suspicious for lymphoma as no primary lesion is identified to suggest this is metastatic adenopathy. Recommend retroperitoneal biopsy. The largest left-sided node appears necrotic and is actually decreased in size since the prior CT scan. It appeared inflamed on the prior study and may have infarcted. I would not recommend biopsying this lesion. 2. No adenopathy in the neck, axilla, chest, pelvis or inguinal regions. 3. No worrisome pulmonary lesions or osseous lesions. Electronically Signed   By: Marijo Sanes M.D.   On: 11/17/2017 08:50   Ct Biopsy  Result Date: 11/25/2017 INDICATION: Lymphoma, enlarging metabolic left retroperitoneal para-aortic lymph node EXAM: CT BIOPSY LEFT PERIAORTIC ADENOPATHY MEDICATIONS: 1% LIDOCAINE LOCAL ANESTHESIA/SEDATION: Moderate (conscious) sedation was employed during this procedure. A total of Versed 2.0 mg and Fentanyl 100 mcg was administered intravenously. Moderate Sedation Time: 15 minutes. The patient's level of consciousness and vital signs were monitored continuously by radiology nursing throughout the procedure under my direct supervision. FLUOROSCOPY TIME:  Fluoroscopy Time: NONE. COMPLICATIONS: None immediate. PROCEDURE: Informed written consent was obtained from the patient after a thorough discussion of the procedural risks, benefits and alternatives. All questions were addressed. Maximal Sterile Barrier Technique was utilized including caps, mask, sterile gowns, sterile gloves, sterile drape, hand hygiene and skin antiseptic. A timeout was performed prior to the initiation of the procedure. Previous imaging reviewed. Patient positioned prone. Noncontrast localization CT performed. The left periaortic adenopathy just inferior to the left renal artery was localized and correlated with the PET scan. Overlying skin marked for a posterior paraspinous approach.  Under sterile conditions and local anesthesia, a 17 gauge 16.8 cm access needle was advanced from a posterior approach to the lymph node. 18 gauge core biopsies obtained. Samples placed  in saline. Postprocedure imaging demonstrates no hemorrhage or hematoma. Patient tolerated the biopsy well. IMPRESSION: Successful CT-guided left periaortic adenopathy 18 gauge core biopsies Electronically Signed   By: Jerilynn Mages.  Shick M.D.   On: 11/25/2017 10:04   US Scrotum W/doppler  Result Date: 12/04/2017 CLINICAL DATA:  57 year old male with history of seminoma on retroperitoneal lymph node biopsy. Evaluate for testicular tumor. EXAM: SCROTAL ULTRASOUND DOPPLER ULTRASOUND OF THE TESTICLES TECHNIQUE: Complete ultrasound examination of the testicles, epididymis, and other scrotal structures was performed. Color and spectral Doppler ultrasound were also utilized to evaluate blood flow to the testicles. COMPARISON:  None. FINDINGS: Right testicle Measurements: 4.5 x 1.9 x 2.4 cm. No mass or microlithiasis visualized. Left testicle Measurements:  3.5 x 1.4 cm.  No mass or microlithiasis visualized. Right epididymis:  Normal in size and appearance. Left epididymis: Normal in size and appearance. Simple anechoic cyst measuring 5 x 3 x 5 mm present at the left epididymal head, most consistent with a small benign epididymal cyst. Hydrocele:  None visualized. Varicocele:  None visualized. Pulsed Doppler interrogation of both testes demonstrates normal low resistance arterial and venous waveforms bilaterally. IMPRESSION: 1. Negative scrotal ultrasound with no distinct testicular mass identified. 2. 5 mm simple left epididymal cyst, almost certainly benign and felt to be incidental in nature. Electronically Signed   By: Jeannine Boga M.D.   On: 12/04/2017 20:50    ASSESSMENT & PLAN:   57 y.o. male with  1. 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 Prostate enlargement.  11/17/17 PET/CT revealed Isolated hypermetabolic retroperitoneal lymphadenopathy. Findings suspicious for lymphoma as no primary lesion is identified to suggest this is metastatic adenopathy. Recommend retroperitoneal biopsy. The largest left-sided node appears necrotic and is actually decreased in size since the prior CT scan. It appeared inflamed on the prior study and may have infarcted. I would not recommend biopsying this lesion. No adenopathy in the neck, axilla, chest, pelvis or inguinal regions. No worrisome pulmonary lesions or osseous lesions.  US Scrotum to determine if this represents metastatic testicular seminoma vs extragonadal retroperitoneal seminoma  PLAN:  -Discussed pt labwork from 11/25/17; all blood counts were WNL -Discussed the 11/25/17 pathology results which revealed  finding of metastatic seminoma  -Physical examination of the testes was unremarkable  -Will order U-Discussed that the staging is Stage IIB S1 LDH elevated but <1.5X ULN and normal AFP and HCG -Will refer pt to IR for port a cath placement  -Will see the pt back in one week   Component     Latest Ref Rng & Units 10/29/2017  Chromogranin A     0 - 5 nmol/L <1  hCG Quant     0 - 3 mIU/mL 2  LDH     98 - 192 U/L 235 (H)  CEA ([facility])     0.00 - 5.00 ng/mL 1.84  AFP, Serum, Tumor Marker     0.0 - 8.3 ng/mL 2.6  CA 19-9     0 - 35 U/mL 13     US scrotum in tommorrow Port a cath placement by IR RTC with Dr Irene Limbo on 12/08/2017 at 3:20pm    All of the patients questions were answered with apparent satisfaction. The patient knows to call the clinic with any problems, questions or concerns.  The total time spent in the appt was 25 minutes and more than 50% was on  counseling and direct patient cares.     Sullivan Lone MD MS AAHIVMS [facility] Hematology/Oncology Physician [facility]  (Office):       [phone] (Work cell):  [phone] (Fax):           [phone]  12/10/2017 2:13 AM  I, Baldwin [place], am acting as a scribe for Dr. Irene Limbo  .I have reviewed the above documentation for accuracy and completeness, and I agree with the above. Brunetta Genera MD

## 2017-12-09 ENCOUNTER — Other Ambulatory Visit: Payer: Self-pay | Admitting: Radiology

## 2017-12-09 ENCOUNTER — Telehealth: Payer: Self-pay | Admitting: Hematology

## 2017-12-09 DIAGNOSIS — H903 Sensorineural hearing loss, bilateral: Secondary | ICD-10-CM | POA: Diagnosis not present

## 2017-12-09 NOTE — Telephone Encounter (Signed)
Faxed medical records to [facility], Release ID: [id number]

## 2017-12-09 NOTE — Telephone Encounter (Signed)
Appt added for Chemo Education/ unable to schedule treatment-added to infusion log / Appt scheduled with [facility] and patient has been notified and I will call him back once treatments have been added to his schedule per 8/26 los

## 2017-12-10 DIAGNOSIS — C6290 Malignant neoplasm of unspecified testis, unspecified whether descended or undescended: Secondary | ICD-10-CM | POA: Insufficient documentation

## 2017-12-10 MED ORDER — ONDANSETRON HCL 8 MG PO TABS: 8.0000 mg | ORAL_TABLET | Freq: Two times a day (BID) | ORAL | 1 refills | Status: DC | PRN

## 2017-12-10 MED ORDER — LORAZEPAM 0.5 MG PO TABS: 0.5000 mg | ORAL_TABLET | Freq: Four times a day (QID) | ORAL | 0 refills | Status: DC | PRN

## 2017-12-10 MED ORDER — PROCHLORPERAZINE MALEATE 10 MG PO TABS: 10.0000 mg | ORAL_TABLET | Freq: Four times a day (QID) | ORAL | 1 refills | Status: DC | PRN

## 2017-12-10 MED ORDER — LIDOCAINE-PRILOCAINE 2.5-2.5 % EX CREA: TOPICAL_CREAM | CUTANEOUS | 3 refills | Status: DC

## 2017-12-10 MED ORDER — DEXAMETHASONE 4 MG PO TABS: ORAL_TABLET | ORAL | 1 refills | Status: DC

## 2017-12-10 NOTE — Progress Notes (Signed)
START ON PATHWAY REGIMEN - Testicular     A cycle is every 21 days:     Etoposide      Cisplatin   **Always confirm dose/schedule in your pharmacy ordering system**  Patient Characteristics: Seminoma, Newly Diagnosed, Stage II (Lymph Node Mass > 3 cm or Multifocal Nodes) AJCC T Category: [place] AJCC N Category: cN2 AJCC M Category: M0 AJCC S Category Post-Orchiectomy: S1 AJCC 8 Stage Grouping: IIB Current evidence of distant metastases<= Yes Histology: Seminoma  Intent of Therapy: Curative Intent, Discussed with Patient

## 2017-12-11 ENCOUNTER — Ambulatory Visit (HOSPITAL_COMMUNITY)
Admission: RE | Admit: 2017-12-11 | Discharge: 2017-12-11 | Disposition: A | Discharge status: Home / Self Care | Payer: BLUE CROSS/BLUE SHIELD | Source: Ambulatory Visit | Attending: Interventional Radiology | Admitting: Interventional Radiology

## 2017-12-11 ENCOUNTER — Ambulatory Visit (HOSPITAL_COMMUNITY)
Admission: RE | Admit: 2017-12-11 | Discharge: 2017-12-11 | Disposition: A | Discharge status: Home / Self Care | Payer: BLUE CROSS/BLUE SHIELD | Source: Ambulatory Visit | Attending: Hematology | Admitting: Hematology

## 2017-12-11 ENCOUNTER — Encounter (HOSPITAL_COMMUNITY): Payer: Self-pay

## 2017-12-11 DIAGNOSIS — Z452 Encounter for adjustment and management of vascular access device: Secondary | ICD-10-CM | POA: Diagnosis not present

## 2017-12-11 DIAGNOSIS — Z5111 Encounter for antineoplastic chemotherapy: Secondary | ICD-10-CM | POA: Diagnosis not present

## 2017-12-11 DIAGNOSIS — E669 Obesity, unspecified: Secondary | ICD-10-CM | POA: Diagnosis not present

## 2017-12-11 DIAGNOSIS — Z6839 Body mass index (BMI) 39.0-39.9, adult: Secondary | ICD-10-CM | POA: Insufficient documentation

## 2017-12-11 DIAGNOSIS — C6290 Malignant neoplasm of unspecified testis, unspecified whether descended or undescended: Secondary | ICD-10-CM | POA: Diagnosis not present

## 2017-12-11 DIAGNOSIS — I10 Essential (primary) hypertension: Secondary | ICD-10-CM | POA: Insufficient documentation

## 2017-12-11 DIAGNOSIS — E119 Type 2 diabetes mellitus without complications: Secondary | ICD-10-CM | POA: Diagnosis not present

## 2017-12-11 DIAGNOSIS — R590 Localized enlarged lymph nodes: Secondary | ICD-10-CM

## 2017-12-11 DIAGNOSIS — Z7982 Long term (current) use of aspirin: Secondary | ICD-10-CM | POA: Diagnosis not present

## 2017-12-11 HISTORY — PX: IR IMAGING GUIDED PORT INSERTION: IMG5740

## 2017-12-11 LAB — BASIC METABOLIC PANEL
Anion gap: 10 (ref 5–15)
BUN: 19 mg/dL (ref 6–20)
CALCIUM: 9.3 mg/dL (ref 8.9–10.3)
CO2: 27 mmol/L (ref 22–32)
CREATININE: 0.64 mg/dL (ref 0.61–1.24)
Chloride: 102 mmol/L (ref 98–111)
Glucose, Bld: 167 mg/dL — ABNORMAL HIGH (ref 70–99)
Potassium: 4.5 mmol/L (ref 3.5–5.1)
SODIUM: 139 mmol/L (ref 135–145)

## 2017-12-11 LAB — PROTIME-INR
INR: 0.88
Prothrombin Time: 11.9 seconds (ref 11.4–15.2)

## 2017-12-11 LAB — CBC WITH DIFFERENTIAL/PLATELET
BASOS ABS: 0 10*3/uL (ref 0.0–0.1)
BASOS PCT: 0 %
EOS PCT: 2 %
Eosinophils Absolute: 0.2 10*3/uL (ref 0.0–0.7)
HEMATOCRIT: 46.9 % (ref 39.0–52.0)
Hemoglobin: 15.6 g/dL (ref 13.0–17.0)
Lymphocytes Relative: 30 %
Lymphs Abs: 2.5 10*3/uL (ref 0.7–4.0)
MCH: 28.3 pg (ref 26.0–34.0)
MCHC: 33.3 g/dL (ref 30.0–36.0)
MCV: 85 fL (ref 78.0–100.0)
MONO ABS: 0.7 10*3/uL (ref 0.1–1.0)
MONOS PCT: 8 %
NEUTROS ABS: 4.9 10*3/uL (ref 1.7–7.7)
NEUTROS PCT: 60 %
PLATELETS: 294 10*3/uL (ref 150–400)
RBC: 5.52 MIL/uL (ref 4.22–5.81)
RDW: 13.3 % (ref 11.5–15.5)
WBC: 8.2 10*3/uL (ref 4.0–10.5)

## 2017-12-11 LAB — GLUCOSE, CAPILLARY: GLUCOSE-CAPILLARY: 161 mg/dL — AB (ref 70–99)

## 2017-12-11 MED ORDER — LIDOCAINE HCL 1 % IJ SOLN
INTRAMUSCULAR | Status: AC
  Filled 2017-12-11: qty 20

## 2017-12-11 MED ORDER — CEFAZOLIN SODIUM-DEXTROSE 2-4 GM/100ML-% IV SOLN
INTRAVENOUS | Status: AC
  Administered 2017-12-11: 2 g via INTRAVENOUS
  Filled 2017-12-11: qty 100

## 2017-12-11 MED ORDER — FENTANYL CITRATE (PF) 100 MCG/2ML IJ SOLN
INTRAMUSCULAR | Status: AC | PRN
  Administered 2017-12-11 (×2): 50 ug via INTRAVENOUS

## 2017-12-11 MED ORDER — FENTANYL CITRATE (PF) 100 MCG/2ML IJ SOLN
INTRAMUSCULAR | Status: AC
  Filled 2017-12-11: qty 2

## 2017-12-11 MED ORDER — LIDOCAINE HCL 1 % IJ SOLN
INTRAMUSCULAR | Status: AC | PRN
  Administered 2017-12-11: 15 mL

## 2017-12-11 MED ORDER — SODIUM CHLORIDE 0.9 % IV SOLN
INTRAVENOUS | Status: DC
  Administered 2017-12-11: 08:00:00 via INTRAVENOUS

## 2017-12-11 MED ORDER — CEFAZOLIN SODIUM-DEXTROSE 2-4 GM/100ML-% IV SOLN
2.0000 g | INTRAVENOUS | Status: AC
  Administered 2017-12-11: 2 g via INTRAVENOUS

## 2017-12-11 MED ORDER — MIDAZOLAM HCL 2 MG/2ML IJ SOLN
INTRAMUSCULAR | Status: AC | PRN
  Administered 2017-12-11: 2 mg via INTRAVENOUS
  Administered 2017-12-11 (×2): 1 mg via INTRAVENOUS

## 2017-12-11 MED ORDER — MIDAZOLAM HCL 2 MG/2ML IJ SOLN
INTRAMUSCULAR | Status: AC
  Filled 2017-12-11: qty 4

## 2017-12-11 MED ORDER — HEPARIN SOD (PORK) LOCK FLUSH 100 UNIT/ML IV SOLN
INTRAVENOUS | Status: AC
  Filled 2017-12-11: qty 5

## 2017-12-11 NOTE — Discharge Instructions (Signed)
Do not use EMLA cream over skin glue until it is healed. EMLA cream will dissolve skin glue.   Implanted Port Insertion, Care After This sheet gives you information about how to care for yourself after your procedure. Your health care provider may also give you more specific instructions. If you have problems or questions, contact your health care provider. What can I expect after the procedure? After your procedure, it is common to have:  Discomfort at the port insertion site.  Bruising on the skin over the port. This should improve over 3-4 days.  Follow these instructions at home: [facility] care  After your port is placed, you will get a manufacturer's information card. The card has information about your port. Keep this card with you at all times.  Take care of the port as told by your health care provider. Ask your health care provider if you or a family member can get training for taking care of the port at home. A home health care nurse may also take care of the port.  Make sure to remember what type of port you have. Incision care  Follow instructions from your health care provider about how to take care of your port insertion site. Make sure you: ? Wash your hands with soap and water before you change your bandage (dressing). If soap and water are not available, use hand sanitizer. ? Change your dressing as told by your health care provider. ? Leave stitches (sutures), skin glue, or adhesive strips in place. These skin closures may need to stay in place for 2 weeks or longer. If adhesive strip edges start to loosen and curl up, you may trim the loose edges. Do not remove adhesive strips completely unless your health care provider tells you to do that.  Check your port insertion site every day for signs of infection. Check for: ? More redness, swelling, or pain. ? More fluid or blood. ? Warmth. ? Pus or a bad smell. General instructions  Do not take baths, swim, or use a hot tub  until your health care provider approves.  Do not lift anything that is heavier than 10 lb (4.5 kg) for a week, or as told by your health care provider.  Ask your health care provider when it is okay to: ? Return to work or school. ? Resume usual physical activities or sports.  Do not drive for 24 hours if you were given a medicine to help you relax (sedative).  Take over-the-counter and prescription medicines only as told by your health care provider.  Wear a medical alert bracelet in case of an emergency. This will tell any health care providers that you have a port.  Keep all follow-up visits as told by your health care provider. This is important. Contact a health care provider if:  You cannot flush your port with saline as directed, or you cannot draw blood from the port.  You have a fever or chills.  You have more redness, swelling, or pain around your port insertion site.  You have more fluid or blood coming from your port insertion site.  Your port insertion site feels warm to the touch.  You have pus or a bad smell coming from the port insertion site. Get help right away if:  You have chest pain or shortness of breath.  You have bleeding from your port that you cannot control. Summary  Take care of the port as told by your health care provider.  Change your  dressing as told by your health care provider.  Keep all follow-up visits as told by your health care provider. This information is not intended to replace advice given to you by your health care provider. Make sure you discuss any questions you have with your health care provider. Document Released: 01/20/2013 Document Revised: 02/21/2016 Document Reviewed: 02/21/2016 Elsevier Interactive Patient Education  2017 [employer].     Moderate Conscious Sedation, Adult, Care After These instructions provide you with information about caring for yourself after your procedure. Your health care provider may also  give you more specific instructions. Your treatment has been planned according to current medical practices, but problems sometimes occur. Call your health care provider if you have any problems or questions after your procedure. What can I expect after the procedure? After your procedure, it is common:  To feel sleepy for several hours.  To feel clumsy and have poor balance for several hours.  To have poor judgment for several hours.  To vomit if you eat too soon.  Follow these instructions at home: For at least 24 hours after the procedure:   Do not: ? Participate in activities where you could fall or become injured. ? Drive. ? Use heavy machinery. ? Drink alcohol. ? Take sleeping pills or medicines that cause drowsiness. ? Make important decisions or sign legal documents. ? Take care of children on your own.  Rest. Eating and drinking  Follow the diet recommended by your health care provider.  If you vomit: ? Drink water, juice, or soup when you can drink without vomiting. ? Make sure you have little or no nausea before eating solid foods. General instructions  Have a responsible adult stay with you until you are awake and alert.  Take over-the-counter and prescription medicines only as told by your health care provider.  If you smoke, do not smoke without supervision.  Keep all follow-up visits as told by your health care provider. This is important. Contact a health care provider if:  You keep feeling nauseous or you keep vomiting.  You feel light-headed.  You develop a rash.  You have a fever. Get help right away if:  You have trouble breathing. This information is not intended to replace advice given to you by your health care provider. Make sure you discuss any questions you have with your health care provider. Document Released: 01/20/2013 Document Revised: 09/04/2015 Document Reviewed: 07/22/2015 Elsevier Interactive Patient Education  [employer].

## 2017-12-11 NOTE — Procedures (Signed)
RIJV PAC SVC RA EBL 0 Comp 0 

## 2017-12-11 NOTE — H&P (Signed)
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  Referring Physician(s): Brunetta Genera  Supervising Physician: Marybelle Killings  Patient Status: [facility] - Out-pt  History of Present Illness: Neil Berg is a 57 y.o. male with a past medical history of hypertension, diabetes mellitus, and obesity. A few months ago, he saw his PCP with complaint of back and abdominal pain. He was found to have retroperitoneal lymphadenopathy and was referred to Dr. Irene Limbo for management. 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 He is scheduled to begin chemotherapy in the near future.  IR requested by Dr. Irene Limbo for possible image-guided Port-a-cath insertion. Patient awake and alert laying in bed with no complaints at this time. Denies fever, chills, chest pain, dyspnea, abdominal pain, dizziness, or headache.   Past Medical History:  Diagnosis Date  . Chest pain   . Diabetes mellitus without complication ([place])   . Hypertension   . Obesity     Past Surgical History:  Procedure Laterality Date  . EYE SURGERY     left eye cataract removal / implant   . TONSILLECTOMY      Allergies: Patient has no known allergies.  Medications: Prior to Admission medications   Medication Sig Start Date End Date Taking? Authorizing Provider  amLODipine (NORVASC) 5 MG tablet Take 1 tablet (5 mg total) by mouth daily. 06/24/14  Yes Jerline Pain, MD  aspirin EC 81 MG tablet Take 81 mg by mouth daily.   Yes [provider]  b complex vitamins tablet Take 1 tablet by mouth daily.   Yes [provider]  JARDIANCE 25 MG TABS tablet TK 1 T PO QAM 08/28/17  Yes [provider]  lisinopril-hydrochlorothiazide (PRINZIDE,ZESTORETIC) 20-12.5 MG tablet Take 1 tablet by mouth daily. 06/23/15  Yes Jerline Pain, MD  dexamethasone (DECADRON) 4 MG tablet Take 2 tablets by mouth once a day on  the day starting on day 6 of chemotherapy and then take 2 tablets two times a day for 2 days. 12/10/17   Brunetta Genera, MD  HYDROcodone-acetaminophen (NORCO/VICODIN) 5-325 MG tablet Take 1 tablet by mouth every 6 (six) hours as needed for moderate pain or severe pain. 10/23/17   Jaynee Eagles, PA-C  lidocaine-prilocaine (EMLA) cream Apply to affected area once 12/10/17   Brunetta Genera, MD  LORazepam (ATIVAN) 0.5 MG tablet Take 1 tablet (0.5 mg total) by mouth every 6 (six) hours as needed (Nausea or vomiting). 12/10/17   Brunetta Genera, MD  ondansetron (ZOFRAN) 8 MG tablet Take 1 tablet (8 mg total) by mouth 2 (two) times daily as needed. Start on day 8 of chemotherapy. 12/10/17   Brunetta Genera, MD  prochlorperazine (COMPAZINE) 10 MG tablet Take 1 tablet (10 mg total) by mouth every 6 (six) hours as needed (Nausea or vomiting). 12/10/17   Brunetta Genera, MD     Family History  Problem Relation Age of Onset  . Heart disease Maternal Grandmother   . Heart attack Maternal Grandfather     Social History   Socioeconomic History  . Marital status: Married    Spouse name: Not on file  . Number of children: Not on file  . Years of education: Not on file  . Highest education level: Not on file  Occupational History  . Not on file  Social Needs  . Financial resource strain: Not on file  . Food insecurity:  Worry: Not on file    Inability: Not on file  . Transportation needs:    Medical: Not on file    Non-medical: Not on file  Tobacco Use  . Smoking status: Never Smoker  . Smokeless tobacco: Never Used  Substance and Sexual Activity  . Alcohol use: No  . Drug use: No  . Sexual activity: Not on file  Lifestyle  . Physical activity:    Days per week: Not on file    Minutes per session: Not on file  . Stress: Not on file  Relationships  . Social connections:    Talks on phone: Not on file    Gets together: Not on file    Attends religious service: Not on  file    Active member of club or organization: Not on file    Attends meetings of clubs or organizations: Not on file    Relationship status: Not on file  Other Topics Concern  . Not on file  Social History Narrative  . Not on file     Review of Systems: A 12 point ROS discussed and pertinent positives are indicated in the HPI above.  All other systems are negative.  Review of Systems  Constitutional: Negative for chills and fever.  Respiratory: Negative for shortness of breath and wheezing.   Cardiovascular: Negative for chest pain and palpitations.  Gastrointestinal: Negative for abdominal pain.  Neurological: Negative for dizziness and headaches.  Psychiatric/Behavioral: Negative for behavioral problems and confusion.    Vital Signs: BP (!) 157/90   Pulse 87   Temp 98.7 F (37.1 C) (Oral)   Resp 16   SpO2 96%   Physical Exam  Constitutional: He is oriented to person, place, and time. He appears well-developed and well-nourished. No distress.  Cardiovascular: Normal rate, regular rhythm and normal heart sounds.  No murmur heard. Pulmonary/Chest: Effort normal and breath sounds normal. No respiratory distress. He has no wheezes.  Neurological: He is alert and oriented to person, place, and time.  Skin: Skin is warm and dry.  Psychiatric: He has a normal mood and affect. His behavior is normal. Judgment and thought content normal.  Nursing note and vitals reviewed.    MD Evaluation Airway: WNL Heart: WNL Abdomen: WNL Chest/ Lungs: WNL ASA  Classification: 2 Mallampati/Airway Score: Two   Imaging: Nm Pet Image Initial (pi) Skull Base To Thigh  Result Date: 11/17/2017 CLINICAL DATA:  Initial treatment strategy for lymphadenopathy. EXAM: NUCLEAR MEDICINE PET SKULL BASE TO THIGH TECHNIQUE: 13.9 mCi F-18 FDG was injected intravenously. Full-ring PET imaging was performed from the skull base to thigh after the radiotracer. CT data was obtained and used for attenuation  correction and anatomic localization. Fasting blood glucose: 144 mg/dl COMPARISON:  CT scan 10/14/2017 FINDINGS: Mediastinal blood pool activity: SUV max 2.64 NECK: No hypermetabolic lymph nodes in the neck. Incidental CT findings: none CHEST: No hypermetabolic mediastinal or hilar nodes. No suspicious pulmonary nodules on the CT scan. Incidental CT findings: Densely calcified lesion in the subcutaneous fat in the lower left thorax posteriorly likely due to remote trauma. ABDOMEN/PELVIS: Hypermetabolic retroperitoneal lymphadenopathy. 15.5 mm right-sided retroperitoneal lymph node posterior to the IVC on image number 136 has an SUV max of 14.19. This measured 12 mm on the prior CT scan. 3 cm left-sided necrotic appearing retroperitoneal lymph node previously measured 4.5 cm. It contains a small focus of hypermetabolism with SUV max of 5.5. Two adjacent left-sided retroperitoneal lymph nodes on image number 195 are hypermetabolic with  SUV max of 11.87. The larger more lateral node measures 2.1 cm and previously measured 1.8 cm. The solid abdominal organs are unremarkable. No hepatic or splenic lesions or splenomegaly. No pelvic or inguinal lymphadenopathy. No obvious testicular or prostate lesions. Incidental CT findings: Enlarged prostate gland. Age advanced scattered atherosclerotic aortic and iliac artery calcifications. SKELETON: No focal hypermetabolic activity to suggest skeletal metastasis. Incidental CT findings: none IMPRESSION: 1. Isolated hypermetabolic retroperitoneal lymphadenopathy. Findings suspicious for lymphoma as no primary lesion is identified to suggest this is metastatic adenopathy. Recommend retroperitoneal biopsy. The largest left-sided node appears necrotic and is actually decreased in size since the prior CT scan. It appeared inflamed on the prior study and may have infarcted. I would not recommend biopsying this lesion. 2. No adenopathy in the neck, axilla, chest, pelvis or inguinal  regions. 3. No worrisome pulmonary lesions or osseous lesions. Electronically Signed   By: Marijo Sanes M.D.   On: 11/17/2017 08:50   Ct Biopsy  Result Date: 11/25/2017 INDICATION: Lymphoma, enlarging metabolic left retroperitoneal para-aortic lymph node EXAM: CT BIOPSY LEFT PERIAORTIC ADENOPATHY MEDICATIONS: 1% LIDOCAINE LOCAL ANESTHESIA/SEDATION: Moderate (conscious) sedation was employed during this procedure. A total of Versed 2.0 mg and Fentanyl 100 mcg was administered intravenously. Moderate Sedation Time: 15 minutes. The patient's level of consciousness and vital signs were monitored continuously by radiology nursing throughout the procedure under my direct supervision. FLUOROSCOPY TIME:  Fluoroscopy Time: NONE. COMPLICATIONS: None immediate. PROCEDURE: Informed written consent was obtained from the patient after a thorough discussion of the procedural risks, benefits and alternatives. All questions were addressed. Maximal Sterile Barrier Technique was utilized including caps, mask, sterile gowns, sterile gloves, sterile drape, hand hygiene and skin antiseptic. A timeout was performed prior to the initiation of the procedure. Previous imaging reviewed. Patient positioned prone. Noncontrast localization CT performed. The left periaortic adenopathy just inferior to the left renal artery was localized and correlated with the PET scan. Overlying skin marked for a posterior paraspinous approach. Under sterile conditions and local anesthesia, a 17 gauge 16.8 cm access needle was advanced from a posterior approach to the lymph node. 18 gauge core biopsies obtained. Samples placed in saline. Postprocedure imaging demonstrates no hemorrhage or hematoma. Patient tolerated the biopsy well. IMPRESSION: Successful CT-guided left periaortic adenopathy 18 gauge core biopsies Electronically Signed   By: Jerilynn Mages.  Shick M.D.   On: 11/25/2017 10:04   US Scrotum W/doppler  Result Date: 12/04/2017 CLINICAL DATA:  57 year old  male with history of seminoma on retroperitoneal lymph node biopsy. Evaluate for testicular tumor. EXAM: SCROTAL ULTRASOUND DOPPLER ULTRASOUND OF THE TESTICLES TECHNIQUE: Complete ultrasound examination of the testicles, epididymis, and other scrotal structures was performed. Color and spectral Doppler ultrasound were also utilized to evaluate blood flow to the testicles. COMPARISON:  None. FINDINGS: Right testicle Measurements: 4.5 x 1.9 x 2.4 cm. No mass or microlithiasis visualized. Left testicle Measurements:  3.5 x 1.4 cm.  No mass or microlithiasis visualized. Right epididymis:  Normal in size and appearance. Left epididymis: Normal in size and appearance. Simple anechoic cyst measuring 5 x 3 x 5 mm present at the left epididymal head, most consistent with a small benign epididymal cyst. Hydrocele:  None visualized. Varicocele:  None visualized. Pulsed Doppler interrogation of both testes demonstrates normal low resistance arterial and venous waveforms bilaterally. IMPRESSION: 1. Negative scrotal ultrasound with no distinct testicular mass identified. 2. 5 mm simple left epididymal cyst, almost certainly benign and felt to be incidental in nature. Electronically Signed   By:  Jeannine Boga M.D.   On: 12/04/2017 20:50    Labs:  CBC: Recent Labs    10/17/17 1644 10/29/17 1202 11/25/17 0712 12/11/17 0758  WBC 13.9* 10.6* 7.9 8.2  HGB 15.9 15.0 16.0 15.6  HCT 47.8 45.5 48.1 46.9  PLT 345 347 264 294    COAGS: Recent Labs    11/25/17 0712  INR 0.97  APTT 27    BMP: Recent Labs    10/14/17 1812 10/29/17 1202  NA  --  137  K  --  4.2  CL  --  97*  CO2  --  30  GLUCOSE  --  116*  BUN  --  15  CALCIUM  --  9.9  CREATININE 0.50* 0.83  GFRNONAA  --  >60  GFRAA  --  >60    LIVER FUNCTION TESTS: Recent Labs    10/29/17 1202  BILITOT 0.4  AST 18  ALT 25  ALKPHOS 129*  PROT 8.3*  ALBUMIN 4.3    TUMOR MARKERS: Recent Labs    10/29/17 1202  CHROMGRNA <1     Assessment and Plan:  Seminomatous tumor. Plan for image-guided Port-a-cath insertion today with Dr. Barbie Banner. Patient is NPO. Denies fever and WBCs WNL. He does not take blood thinners. INR 0.88 seconds this AM.  Risks and benefits of image guided port-a-catheter placement was discussed with the patient including, but not limited to bleeding, infection, pneumothorax, or fibrin sheath development and need for additional procedures. All of the patient's questions were answered, patient is agreeable to proceed. Consent signed and in chart.   Thank you for this interesting consult.  I greatly enjoyed meeting Amil Bouwman and look forward to participating in their care.  A copy of this report was sent to the requesting provider on this date.  Electronically Signed: Earley Abide, PA-C 12/11/2017, 8:33 AM   I spent a total of 15 Minutes in face to face in clinical consultation, greater than 50% of which was counseling/coordinating care for seminomatous tumor.

## 2017-12-16 ENCOUNTER — Inpatient Hospital Stay: Payer: BLUE CROSS/BLUE SHIELD | Attending: Hematology

## 2017-12-16 DIAGNOSIS — Z7982 Long term (current) use of aspirin: Secondary | ICD-10-CM | POA: Insufficient documentation

## 2017-12-16 DIAGNOSIS — E119 Type 2 diabetes mellitus without complications: Secondary | ICD-10-CM | POA: Insufficient documentation

## 2017-12-16 DIAGNOSIS — E669 Obesity, unspecified: Secondary | ICD-10-CM | POA: Insufficient documentation

## 2017-12-16 DIAGNOSIS — C6290 Malignant neoplasm of unspecified testis, unspecified whether descended or undescended: Secondary | ICD-10-CM | POA: Insufficient documentation

## 2017-12-16 DIAGNOSIS — Z5111 Encounter for antineoplastic chemotherapy: Secondary | ICD-10-CM | POA: Insufficient documentation

## 2017-12-16 DIAGNOSIS — C786 Secondary malignant neoplasm of retroperitoneum and peritoneum: Secondary | ICD-10-CM | POA: Insufficient documentation

## 2017-12-16 DIAGNOSIS — I10 Essential (primary) hypertension: Secondary | ICD-10-CM | POA: Insufficient documentation

## 2017-12-16 DIAGNOSIS — Z79899 Other long term (current) drug therapy: Secondary | ICD-10-CM | POA: Insufficient documentation

## 2017-12-16 DIAGNOSIS — N400 Enlarged prostate without lower urinary tract symptoms: Secondary | ICD-10-CM | POA: Insufficient documentation

## 2017-12-22 ENCOUNTER — Inpatient Hospital Stay: Payer: BLUE CROSS/BLUE SHIELD

## 2017-12-22 ENCOUNTER — Telehealth: Payer: Self-pay | Admitting: Hematology

## 2017-12-22 ENCOUNTER — Inpatient Hospital Stay: Payer: BLUE CROSS/BLUE SHIELD | Admitting: Hematology

## 2017-12-22 VITALS — BP 131/79 | HR 72 | Temp 98.3°F | Resp 14

## 2017-12-22 DIAGNOSIS — C6290 Malignant neoplasm of unspecified testis, unspecified whether descended or undescended: Secondary | ICD-10-CM

## 2017-12-22 DIAGNOSIS — Z95828 Presence of other vascular implants and grafts: Secondary | ICD-10-CM

## 2017-12-22 DIAGNOSIS — Z7982 Long term (current) use of aspirin: Secondary | ICD-10-CM | POA: Diagnosis not present

## 2017-12-22 DIAGNOSIS — N400 Enlarged prostate without lower urinary tract symptoms: Secondary | ICD-10-CM | POA: Diagnosis not present

## 2017-12-22 DIAGNOSIS — R590 Localized enlarged lymph nodes: Secondary | ICD-10-CM

## 2017-12-22 DIAGNOSIS — E119 Type 2 diabetes mellitus without complications: Secondary | ICD-10-CM | POA: Diagnosis not present

## 2017-12-22 DIAGNOSIS — I10 Essential (primary) hypertension: Secondary | ICD-10-CM | POA: Diagnosis not present

## 2017-12-22 DIAGNOSIS — Z79899 Other long term (current) drug therapy: Secondary | ICD-10-CM | POA: Diagnosis not present

## 2017-12-22 DIAGNOSIS — Z5111 Encounter for antineoplastic chemotherapy: Secondary | ICD-10-CM | POA: Diagnosis not present

## 2017-12-22 DIAGNOSIS — E669 Obesity, unspecified: Secondary | ICD-10-CM | POA: Diagnosis not present

## 2017-12-22 DIAGNOSIS — C786 Secondary malignant neoplasm of retroperitoneum and peritoneum: Secondary | ICD-10-CM | POA: Diagnosis not present

## 2017-12-22 LAB — CMP (CANCER CENTER ONLY)
ALT: 14 U/L (ref 0–44)
AST: 11 U/L — AB (ref 15–41)
Albumin: 3.8 g/dL (ref 3.5–5.0)
Alkaline Phosphatase: 111 U/L (ref 38–126)
Anion gap: 10 (ref 5–15)
BILIRUBIN TOTAL: 0.7 mg/dL (ref 0.3–1.2)
BUN: 18 mg/dL (ref 6–20)
CO2: 28 mmol/L (ref 22–32)
CREATININE: 0.76 mg/dL (ref 0.61–1.24)
Calcium: 9.5 mg/dL (ref 8.9–10.3)
Chloride: 99 mmol/L (ref 98–111)
Glucose, Bld: 158 mg/dL — ABNORMAL HIGH (ref 70–99)
POTASSIUM: 4.2 mmol/L (ref 3.5–5.1)
Sodium: 137 mmol/L (ref 135–145)
TOTAL PROTEIN: 7.7 g/dL (ref 6.5–8.1)

## 2017-12-22 LAB — CBC WITH DIFFERENTIAL/PLATELET
BASOS ABS: 0.1 10*3/uL (ref 0.0–0.1)
Basophils Relative: 1 %
EOS ABS: 0.2 10*3/uL (ref 0.0–0.5)
EOS PCT: 3 %
HCT: 44.9 % (ref 38.4–49.9)
HEMOGLOBIN: 15.1 g/dL (ref 13.0–17.1)
LYMPHS PCT: 23 %
Lymphs Abs: 2.1 10*3/uL (ref 0.9–3.3)
MCH: 28.3 pg (ref 27.2–33.4)
MCHC: 33.6 g/dL (ref 32.0–36.0)
MCV: 84.3 fL (ref 79.3–98.0)
Monocytes Absolute: 0.9 10*3/uL (ref 0.1–0.9)
Monocytes Relative: 10 %
NEUTROS PCT: 63 %
Neutro Abs: 5.8 10*3/uL (ref 1.5–6.5)
PLATELETS: 283 10*3/uL (ref 140–400)
RBC: 5.32 MIL/uL (ref 4.20–5.82)
RDW: 13.7 % (ref 11.0–14.6)
WBC: 9 10*3/uL (ref 4.0–10.3)

## 2017-12-22 LAB — MAGNESIUM: MAGNESIUM: 2.1 mg/dL (ref 1.7–2.4)

## 2017-12-22 LAB — LACTATE DEHYDROGENASE: LDH: 209 U/L — AB (ref 98–192)

## 2017-12-22 MED ORDER — SODIUM CHLORIDE 0.9 % IV SOLN
100.0000 mg/m2 | Freq: Once | INTRAVENOUS | Status: AC
  Administered 2017-12-22: 260 mg via INTRAVENOUS
  Filled 2017-12-22: qty 13

## 2017-12-22 MED ORDER — PALONOSETRON HCL INJECTION 0.25 MG/5ML
INTRAVENOUS | Status: AC
  Filled 2017-12-22: qty 5

## 2017-12-22 MED ORDER — SODIUM CHLORIDE 0.9 % IV SOLN
100.0000 mg/m2 | Freq: Once | INTRAVENOUS | Status: DC
  Filled 2017-12-22: qty 13

## 2017-12-22 MED ORDER — SODIUM CHLORIDE 0.9% FLUSH
10.0000 mL | INTRAVENOUS | Status: DC | PRN
  Administered 2017-12-22: 10 mL
  Filled 2017-12-22: qty 10

## 2017-12-22 MED ORDER — HEPARIN SOD (PORK) LOCK FLUSH 100 UNIT/ML IV SOLN
500.0000 [IU] | Freq: Once | INTRAVENOUS | Status: AC | PRN
  Administered 2017-12-22: 500 [IU]
  Filled 2017-12-22: qty 5

## 2017-12-22 MED ORDER — SODIUM CHLORIDE 0.9 % IV SOLN
20.0000 mg/m2 | Freq: Once | INTRAVENOUS | Status: AC
  Administered 2017-12-22: 52 mg via INTRAVENOUS
  Filled 2017-12-22: qty 52

## 2017-12-22 MED ORDER — SODIUM CHLORIDE 0.9% FLUSH
10.0000 mL | INTRAVENOUS | Status: DC | PRN
  Administered 2017-12-22: 10 mL via INTRAVENOUS
  Filled 2017-12-22: qty 10

## 2017-12-22 MED ORDER — SODIUM CHLORIDE 0.9 % IV SOLN
Freq: Once | INTRAVENOUS | Status: AC
  Administered 2017-12-22: 09:00:00 via INTRAVENOUS
  Filled 2017-12-22: qty 250

## 2017-12-22 MED ORDER — PALONOSETRON HCL INJECTION 0.25 MG/5ML
0.2500 mg | Freq: Once | INTRAVENOUS | Status: AC
  Administered 2017-12-22: 0.25 mg via INTRAVENOUS

## 2017-12-22 MED ORDER — SODIUM CHLORIDE 0.9 % IV SOLN
Freq: Once | INTRAVENOUS | Status: AC
  Administered 2017-12-22: 11:00:00 via INTRAVENOUS
  Filled 2017-12-22: qty 5

## 2017-12-22 MED ORDER — POTASSIUM CHLORIDE 2 MEQ/ML IV SOLN
Freq: Once | INTRAVENOUS | Status: AC
  Administered 2017-12-22: 09:00:00 via INTRAVENOUS
  Filled 2017-12-22: qty 10

## 2017-12-22 NOTE — Patient Instructions (Signed)
[facility] Discharge Instructions for Patients Receiving Chemotherapy  Today you received the following chemotherapy agents cisplatin, etoposide.  To help prevent nausea and vomiting after your treatment, we encourage you to take your nausea medication. DO NOT TAKE ZOFRAN FOR THREE DAYS AFTER TREATMENT.   If you develop nausea and vomiting that is not controlled by your nausea medication, call the clinic.   BELOW ARE SYMPTOMS THAT SHOULD BE REPORTED IMMEDIATELY:  *FEVER GREATER THAN 100.5 F  *CHILLS WITH OR WITHOUT FEVER  NAUSEA AND VOMITING THAT IS NOT CONTROLLED WITH YOUR NAUSEA MEDICATION  *UNUSUAL SHORTNESS OF BREATH  *UNUSUAL BRUISING OR BLEEDING  TENDERNESS IN MOUTH AND THROAT WITH OR WITHOUT PRESENCE OF ULCERS  *URINARY PROBLEMS  *BOWEL PROBLEMS  UNUSUAL RASH Items with * indicate a potential emergency and should be followed up as soon as possible.  Feel free to call the clinic should you have any questions or concerns. The clinic phone number is [phone].  Please show the [facility] at check-in to the Emergency Department and triage nurse.

## 2017-12-22 NOTE — Progress Notes (Incomplete)
HEMATOLOGY/ONCOLOGY CLNIC NOTE  Date of Service: 12/22/2017  Patient Care Team: Antony Contras, MD as PCP - General (Family Medicine)  CHIEF COMPLAINTS/PURPOSE OF CONSULTATION:  Lymphadenopathy   HISTORY OF PRESENTING ILLNESS:   Neil Berg is a wonderful 57 y.o. male who has been referred to [place] by Dr. Antony Contras for evaluation and management of Lymphadenopathy. He is accompanied today by his wife. The pt reports that he is doing well overall and [place]   The pt reports that he hasn't had any pain in over a week and recently appeared to [facility] on 10/17/17. He notes that he woke up on the morning of 10/17/17 with significant, unique, back and kidney pain which began to spread around to the RUQ of his abdomen as the morning progressed. He notes that his pain went away after taking pain medications for 5 days, and describes that the pain was waxing and waning. He also endorses some positional exacerbation to his pain. He denies any nausea, vomiting, diarrhea, changes in bowel habits, testicular pain or swelling, difficulty/changes in urination at the time or since then.    The pt also notes that  In the week of these symptoms he felt very cold, which is different for him as he describes himself as very warm natured. He also notes that his appetite weakened and he lost about 10 pounds, and was abiding by the [employer]. He notes that his appetite has returned and he is eating normally again. He denies any pain associated with meals. He denies recent exotic or international travel  He takes Jardiance for his DM. He notes that his PSA spiked two years ago, had a biopsy, and his PSA counts have decreased recently and is believed to be BPH.    He also notes that he had a recurring rash on his calves, worse on the left, that was red and flat, mimicking his sock impression, but extending further up his leg. He notes that this happened over 3 months,  and that it came and went without any discernable pattern. He denies any associated itching.   He also notes a place on the front of his left lower leg that has been slightly swollen for the past a week. He denies remembering running into anything, bruising, or pain.   Of note prior to the patient's visit today, pt has had CT A/P completed on 10/14/17 with results revealing Retroperitoneal lymphadenopathy, highly suspicious for lymphoma or metastatic disease. 2. Prostate enlargement 3.  Aortic Atherosclerosis.   Most recent lab results (10/17/17) of CBC w/diff is as follows: all values are WNL except for WBC at 13.9k, RBC at 5.83, ANC at 10.0k, Monocytes abs at 1.4k.  On review of systems, pt reports recent back pain, feeling cold recently, recent abdominal pain, and denies nausea, vomiting, diarrhea, changes in bowel habits, difficulty/changes in urination, blood in the urine, abdominal trauma, constipation, fevers, night sweats, chills, testicular pain or swelling, noticing any lumps or bumps, unexpected weight loss, and any other symptoms.   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  On Social Hx the pt reports working in [employer] and denies chemical/radiaiton exposure. He denies ever smoking and other drug use.  Interval History:   Neil Berg returns today for management and evaluation of his newly diagnosed Stage II extra-gonadal  retroperitoneal Seminoma. The patient's last visit with [place] was on 12/08/17. He is accompanied today by ***. The pt reports that he is doing well overall and ***.   The pt reports ***  Lab results today (12/22/17) of CBC w/diff, CMP, and Reticulocytes is as follows: all values are WNL except for ***.  On review of systems, pt reports *** and denies ***.   A: -Discussed pt labwork today, 12/22/17; *** -***   MEDICAL HISTORY:  Past  Medical History:  Diagnosis Date   Chest pain    Diabetes mellitus without complication ([place])    Hypertension    Obesity   Type II Diabetes Mellitus   SURGICAL HISTORY: Past Surgical History:  Procedure Laterality Date   EYE SURGERY     left eye cataract removal / implant    IR IMAGING GUIDED PORT INSERTION  12/11/2017   TONSILLECTOMY      Tonsillectomy 1974 Cataract surgery-left eye 04/2015 Colonoscopy 06/04/16  SOCIAL HISTORY: Social History   Socioeconomic History   Marital status: Married    Spouse name: Not on file   Number of children: Not on file   Years of education: Not on file   Highest education level: Not on file  Occupational History   Not on file  Social Needs   Financial resource strain: Not on file   Food insecurity:    Worry: Not on file    Inability: Not on file   Transportation needs:    Medical: Not on file    Non-medical: Not on file  Tobacco Use   Smoking status: Never Smoker   Smokeless tobacco: Never Used  Substance and Sexual Activity   Alcohol use: No   Drug use: No   Sexual activity: Not on file  Lifestyle   Physical activity:    Days per week: Not on file    Minutes per session: Not on file   Stress: Not on file  Relationships   Social connections:    Talks on phone: Not on file    Gets together: Not on file    Attends religious service: Not on file    Active member of club or organization: Not on file    Attends meetings of clubs or organizations: Not on file    Relationship status: Not on file   Intimate partner violence:    Fear of current or ex partner: Not on file    Emotionally abused: Not on file    Physically abused: Not on file    Forced sexual activity: Not on file  Other Topics Concern   Not on file  Social History Narrative   Not on file    FAMILY HISTORY: Family History  Problem Relation Age of Onset   Heart disease Maternal Grandmother    Heart attack Maternal Grandfather      ALLERGIES:  has No Known Allergies.  MEDICATIONS:  Current Outpatient Medications  Medication Sig Dispense Refill   amLODipine (NORVASC) 5 MG tablet Take 1 tablet (5 mg total) by mouth daily. 30 tablet 11   aspirin EC 81 MG tablet Take 81 mg by mouth daily.     b complex vitamins tablet Take 1 tablet by mouth daily.     dexamethasone (DECADRON) 4 MG tablet Take 2 tablets by mouth once a day on the day starting on day 6 of chemotherapy and then take 2 tablets two times a day for 2 days. 30 tablet 1   HYDROcodone-acetaminophen (NORCO/VICODIN) 5-325 MG tablet Take  1 tablet by mouth every 6 (six) hours as needed for moderate pain or severe pain. 30 tablet 0   JARDIANCE 25 MG TABS tablet TK 1 T PO QAM  0   lidocaine-prilocaine (EMLA) cream Apply to affected area once 30 g 3   lisinopril-hydrochlorothiazide (PRINZIDE,ZESTORETIC) 20-12.5 MG tablet Take 1 tablet by mouth daily. 30 tablet 0   LORazepam (ATIVAN) 0.5 MG tablet Take 1 tablet (0.5 mg total) by mouth every 6 (six) hours as needed (Nausea or vomiting). 30 tablet 0   ondansetron (ZOFRAN) 8 MG tablet Take 1 tablet (8 mg total) by mouth 2 (two) times daily as needed. Start on day 8 of chemotherapy. 30 tablet 1   prochlorperazine (COMPAZINE) 10 MG tablet Take 1 tablet (10 mg total) by mouth every 6 (six) hours as needed (Nausea or vomiting). 30 tablet 1   No current facility-administered medications for this visit.    Facility-Administered Medications Ordered in Other Visits  Medication Dose Route Frequency Provider Last Rate Last Dose   CISplatin (PLATINOL) 52 mg in sodium chloride 0.9 % 250 mL chemo infusion  20 mg/m2 (Treatment Plan Recorded) Intravenous Once Brunetta Genera, MD       etoposide (VEPESID) 260 mg in sodium chloride 0.9 % 1,000 mL chemo infusion  100 mg/m2 (Treatment Plan Recorded) Intravenous Once Brunetta Genera, MD       fosaprepitant (EMEND) 150 mg, dexamethasone (DECADRON) 12 mg in sodium  chloride 0.9 % 145 mL IVPB   Intravenous Once Brunetta Genera, MD       heparin lock flush 100 unit/mL  500 Units Intracatheter Once PRN Brunetta Genera, MD       palonosetron (ALOXI) injection 0.25 mg  0.25 mg Intravenous Once Brunetta Genera, MD       sodium chloride flush (NS) 0.9 % injection 10 mL  10 mL Intravenous PRN Wyatt Portela, MD   10 mL at 12/22/17 3016   sodium chloride flush (NS) 0.9 % injection 10 mL  10 mL Intracatheter PRN Brunetta Genera, MD        REVIEW OF SYSTEMS:    A 10+ POINT REVIEW OF SYSTEMS WAS OBTAINED including neurology, dermatology, psychiatry, cardiac, respiratory, lymph, extremities, GI, GU, Musculoskeletal, constitutional, breasts, reproductive, HEENT.  All pertinent positives are noted in the HPI.  All others are negative.   PHYSICAL EXAMINATION: ECOG PERFORMANCE STATUS: 1 - Symptomatic but completely ambulatory  . There were no vitals filed for this visit. There were no vitals filed for this visit. .There is no height or weight on file to calculate BMI.  GENERAL:alert, in no acute distress and comfortable SKIN: no acute rashes, no significant lesions EYES: conjunctiva are pink and non-injected, sclera anicteric OROPHARYNX: MMM, no exudates, no oropharyngeal erythema or ulceration NECK: supple, no JVD LYMPH:  no palpable lymphadenopathy in the cervical, axillary or inguinal regions LUNGS: clear to auscultation b/l with normal respiratory effort HEART: regular rate & rhythm ABDOMEN:  normoactive bowel sounds , non tender, not distended. No palpable hepatosplenomegaly.  TESTES: no obvious palpable testicular mass Extremity: no pedal edema PSYCH: alert & oriented x 3 with fluent speech NEURO: no focal motor/sensory deficits  LABORATORY DATA:  I have reviewed the data as listed  . CBC Latest Ref Rng & Units 12/22/2017 12/11/2017 11/25/2017  WBC 4.0 - 10.3 K/uL 9.0 8.2 7.9  Hemoglobin 13.0 - 17.1 g/dL 15.1 15.6 16.0   Hematocrit 38.4 - 49.9 % 44.9 46.9 48.1  Platelets 140 -  400 K/uL 283 294 264    . CMP Latest Ref Rng & Units 12/22/2017 12/11/2017 10/29/2017  Glucose 70 - 99 mg/dL 158(H) 167(H) 116(H)  BUN 6 - 20 mg/dL 18 19 15   Creatinine 0.61 - 1.24 mg/dL 0.76 0.64 0.83  Sodium 135 - 145 mmol/L 137 139 137  Potassium 3.5 - 5.1 mmol/L 4.2 4.5 4.2  Chloride 98 - 111 mmol/L 99 102 97(L)  CO2 22 - 32 mmol/L 28 27 30   Calcium 8.9 - 10.3 mg/dL 9.5 9.3 9.9  Total Protein 6.5 - 8.1 g/dL 7.7 - 8.3(H)  Total Bilirubin 0.3 - 1.2 mg/dL 0.7 - 0.4  Alkaline Phos 38 - 126 U/L 111 - 129(H)  AST 15 - 41 U/L 11(L) - 18  ALT 0 - 44 U/L 14 - 25    Pathology:   11/25/17 Tissue Flow Cytometry:    RADIOGRAPHIC STUDIES: I have personally reviewed the radiological images as listed and agreed with the findings in the report. Ct Biopsy  Result Date: 11/25/2017 INDICATION: Lymphoma, enlarging metabolic left retroperitoneal para-aortic lymph node EXAM: CT BIOPSY LEFT PERIAORTIC ADENOPATHY MEDICATIONS: 1% LIDOCAINE LOCAL ANESTHESIA/SEDATION: Moderate (conscious) sedation was employed during this procedure. A total of Versed 2.0 mg and Fentanyl 100 mcg was administered intravenously. Moderate Sedation Time: 15 minutes. The patient's level of consciousness and vital signs were monitored continuously by radiology nursing throughout the procedure under my direct supervision. FLUOROSCOPY TIME:  Fluoroscopy Time: NONE. COMPLICATIONS: None immediate. PROCEDURE: Informed written consent was obtained from the patient after a thorough discussion of the procedural risks, benefits and alternatives. All questions were addressed. Maximal Sterile Barrier Technique was utilized including caps, mask, sterile gowns, sterile gloves, sterile drape, hand hygiene and skin antiseptic. A timeout was performed prior to the initiation of the procedure. Previous imaging reviewed. Patient positioned prone. Noncontrast localization CT performed. The left  periaortic adenopathy just inferior to the left renal artery was localized and correlated with the PET scan. Overlying skin marked for a posterior paraspinous approach. Under sterile conditions and local anesthesia, a 17 gauge 16.8 cm access needle was advanced from a posterior approach to the lymph node. 18 gauge core biopsies obtained. Samples placed in saline. Postprocedure imaging demonstrates no hemorrhage or hematoma. Patient tolerated the biopsy well. IMPRESSION: Successful CT-guided left periaortic adenopathy 18 gauge core biopsies Electronically Signed   By: Jerilynn Mages.  Shick M.D.   On: 11/25/2017 10:04   US Scrotum W/doppler  Result Date: 12/04/2017 CLINICAL DATA:  57 year old male with history of seminoma on retroperitoneal lymph node biopsy. Evaluate for testicular tumor. EXAM: SCROTAL ULTRASOUND DOPPLER ULTRASOUND OF THE TESTICLES TECHNIQUE: Complete ultrasound examination of the testicles, epididymis, and other scrotal structures was performed. Color and spectral Doppler ultrasound were also utilized to evaluate blood flow to the testicles. COMPARISON:  None. FINDINGS: Right testicle Measurements: 4.5 x 1.9 x 2.4 cm. No mass or microlithiasis visualized. Left testicle Measurements:  3.5 x 1.4 cm.  No mass or microlithiasis visualized. Right epididymis:  Normal in size and appearance. Left epididymis: Normal in size and appearance. Simple anechoic cyst measuring 5 x 3 x 5 mm present at the left epididymal head, most consistent with a small benign epididymal cyst. Hydrocele:  None visualized. Varicocele:  None visualized. Pulsed Doppler interrogation of both testes demonstrates normal low resistance arterial and venous waveforms bilaterally. IMPRESSION: 1. Negative scrotal ultrasound with no distinct testicular mass identified. 2. 5 mm simple left epididymal cyst, almost certainly benign and felt to be incidental in nature. Electronically  Signed   By: Jeannine Boga M.D.   On: 12/04/2017 20:50   Ir  Imaging Guided Port Insertion  Result Date: 12/11/2017 CLINICAL DATA:  Seminoma EXAM: TUNNEL POWER PORT PLACEMENT WITH SUBCUTANEOUS POCKET UTILIZING ULTRASOUND & FLOUROSCOPY FLUOROSCOPY TIME:  12 seconds.  5.1 mGy. MEDICATIONS AND MEDICAL HISTORY: Versed 4 mg, Fentanyl 100 mcg. Additional Medications: Ancef 2 g. Antibiotics were given within 2 hours of the procedure. ANESTHESIA/SEDATION: Moderate sedation time: 26 minutes. Nursing monitored the the patient during the procedure. PROCEDURE: After written informed consent was obtained, patient was placed in the supine position on angiographic table. The right neck and chest was prepped and draped in a sterile fashion. Lidocaine was utilized for local anesthesia. The right jugular vein was noted to be patent initially with ultrasound. Under sonographic guidance, a micropuncture needle was inserted into the right IJ vein (Ultrasound and fluoroscopic image documentation was performed). The needle was removed over an 018 wire which was exchanged for a Amplatz. This was advanced into the IVC. An 8-French dilator was advanced over the Amplatz. A small incision was made in the right upper chest over the anterior right second rib. Utilizing blunt dissection, a subcutaneous pocket was created in the caudal direction. The pocket was irrigated with a copious amount of sterile normal saline. The port catheter was tunneled from the chest incision, and out the neck incision. The reservoir was inserted into the subcutaneous pocket and secured with two 3-0 Ethilon stitches. A peel-away sheath was advanced over the Amplatz wire. The port catheter was cut to measure length and inserted through the peel-away sheath. The peel-away sheath was removed. The chest incision was closed with 3-0 Vicryl interrupted stitches for the subcutaneous tissue and a running of 4-0 Vicryl subcuticular stitch for the skin. The neck incision was closed with a 4-0 Vicryl subcuticular stitch. Derma-bond was  applied to both surgical incisions. The port reservoir was flushed and instilled with heparinized saline. No complications. FINDINGS: A right IJ vein Port-A-Cath is in place with its tip at the cavoatrial junction. COMPLICATIONS: None IMPRESSION: Successful 8 French right internal jugular vein power port placement with its tip at the SVC/RA junction. Electronically Signed   By: Marybelle Killings M.D.   On: 12/11/2017 12:41    ASSESSMENT & PLAN:   57 y.o. male with  1. 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 Prostate enlargement.  11/17/17 PET/CT revealed Isolated hypermetabolic retroperitoneal lymphadenopathy. Findings suspicious for lymphoma as no primary lesion is identified to suggest this is metastatic adenopathy. Recommend retroperitoneal biopsy. The largest left-sided node appears necrotic and is actually decreased in size since the prior CT scan. It appeared inflamed on the prior study and may have infarcted. I would not recommend biopsying this lesion. No adenopathy in the neck, axilla, chest, pelvis or inguinal regions. No worrisome pulmonary lesions or osseous lesions.  US Scrotum to determine if this represents metastatic testicular seminoma vs extragonadal retroperitoneal seminoma  PLAN:  -Discussed the 11/25/17 pathology results which revealed  finding of metastatic seminoma  -Physical examination of the testes was unremarkable  -Will order U-Discussed that the staging is Stage IIB S1 LDH elevated but <1.5X ULN and normal AFP and HCG ***   Component     Latest Ref Rng & Units 10/29/2017  Chromogranin A     0 - 5 nmol/L <1  hCG Quant     0 -  3 mIU/mL 2  LDH     98 - 192 U/L 235 (H)  CEA ([facility])     0.00 - 5.00 ng/mL 1.84  AFP, Serum, Tumor Marker     0.0 - 8.3 ng/mL 2.6  CA 19-9     0 - 35 U/mL 13      ***  All of the patients questions were answered with apparent satisfaction. The patient knows to call the clinic with any problems, questions or concerns.  The total time spent in the appt was *** minutes and more than 50% was on counseling and direct patient cares.     Sullivan Lone MD MS AAHIVMS [facility] Hematology/Oncology Physician [facility]  (Office):       [phone] (Work cell):  [phone] (Fax):           [phone]  12/22/2017 10:09 AM  I, Baldwin Jamaica, am acting as a scribe for Dr. Irene Limbo  {Add Scribe Attestation Statement}

## 2017-12-22 NOTE — Telephone Encounter (Signed)
Appt added to current schedule/ patient will get update from my chart per 9/9 sch msg

## 2017-12-23 ENCOUNTER — Inpatient Hospital Stay: Payer: BLUE CROSS/BLUE SHIELD

## 2017-12-23 ENCOUNTER — Telehealth: Payer: Self-pay

## 2017-12-23 VITALS — BP 151/78 | HR 99 | Resp 16

## 2017-12-23 DIAGNOSIS — Z5111 Encounter for antineoplastic chemotherapy: Secondary | ICD-10-CM | POA: Diagnosis not present

## 2017-12-23 DIAGNOSIS — I10 Essential (primary) hypertension: Secondary | ICD-10-CM | POA: Diagnosis not present

## 2017-12-23 DIAGNOSIS — N400 Enlarged prostate without lower urinary tract symptoms: Secondary | ICD-10-CM | POA: Diagnosis not present

## 2017-12-23 DIAGNOSIS — E669 Obesity, unspecified: Secondary | ICD-10-CM | POA: Diagnosis not present

## 2017-12-23 DIAGNOSIS — C786 Secondary malignant neoplasm of retroperitoneum and peritoneum: Secondary | ICD-10-CM | POA: Diagnosis not present

## 2017-12-23 DIAGNOSIS — Z7982 Long term (current) use of aspirin: Secondary | ICD-10-CM | POA: Diagnosis not present

## 2017-12-23 DIAGNOSIS — Z79899 Other long term (current) drug therapy: Secondary | ICD-10-CM | POA: Diagnosis not present

## 2017-12-23 DIAGNOSIS — C6290 Malignant neoplasm of unspecified testis, unspecified whether descended or undescended: Secondary | ICD-10-CM

## 2017-12-23 DIAGNOSIS — E119 Type 2 diabetes mellitus without complications: Secondary | ICD-10-CM | POA: Diagnosis not present

## 2017-12-23 MED ORDER — DEXAMETHASONE SODIUM PHOSPHATE 10 MG/ML IJ SOLN
INTRAMUSCULAR | Status: AC
  Filled 2017-12-23: qty 1

## 2017-12-23 MED ORDER — SODIUM CHLORIDE 0.9 % IV SOLN
Freq: Once | INTRAVENOUS | Status: AC
  Administered 2017-12-23: 09:00:00 via INTRAVENOUS
  Filled 2017-12-23: qty 250

## 2017-12-23 MED ORDER — POTASSIUM CHLORIDE 2 MEQ/ML IV SOLN
Freq: Once | INTRAVENOUS | Status: AC
  Administered 2017-12-23: 09:00:00 via INTRAVENOUS
  Filled 2017-12-23: qty 10

## 2017-12-23 MED ORDER — SODIUM CHLORIDE 0.9 % IV SOLN
20.0000 mg/m2 | Freq: Once | INTRAVENOUS | Status: AC
  Administered 2017-12-23: 52 mg via INTRAVENOUS
  Filled 2017-12-23: qty 52

## 2017-12-23 MED ORDER — SODIUM CHLORIDE 0.9% FLUSH
10.0000 mL | INTRAVENOUS | Status: DC | PRN
  Administered 2017-12-23: 10 mL
  Filled 2017-12-23: qty 10

## 2017-12-23 MED ORDER — SODIUM CHLORIDE 0.9 % IV SOLN
100.0000 mg/m2 | Freq: Once | INTRAVENOUS | Status: AC
  Administered 2017-12-23: 260 mg via INTRAVENOUS
  Filled 2017-12-23: qty 13

## 2017-12-23 MED ORDER — DEXAMETHASONE SODIUM PHOSPHATE 10 MG/ML IJ SOLN
10.0000 mg | Freq: Once | INTRAMUSCULAR | Status: AC
  Administered 2017-12-23: 10 mg via INTRAVENOUS

## 2017-12-23 MED ORDER — HEPARIN SOD (PORK) LOCK FLUSH 100 UNIT/ML IV SOLN
500.0000 [IU] | Freq: Once | INTRAVENOUS | Status: AC | PRN
  Administered 2017-12-23: 500 [IU]
  Filled 2017-12-23: qty 5

## 2017-12-23 NOTE — Telephone Encounter (Signed)
Per 9/9 no los

## 2017-12-23 NOTE — Patient Instructions (Signed)
[facility] Discharge Instructions for Patients Receiving Chemotherapy  Today you received the following chemotherapy agents:  Etoposide and Cisplatin.  To help prevent nausea and vomiting after your treatment, we encourage you to take your nausea medication as directed.   If you develop nausea and vomiting that is not controlled by your nausea medication, call the clinic.   BELOW ARE SYMPTOMS THAT SHOULD BE REPORTED IMMEDIATELY:  *FEVER GREATER THAN 100.5 F  *CHILLS WITH OR WITHOUT FEVER  NAUSEA AND VOMITING THAT IS NOT CONTROLLED WITH YOUR NAUSEA MEDICATION  *UNUSUAL SHORTNESS OF BREATH  *UNUSUAL BRUISING OR BLEEDING  TENDERNESS IN MOUTH AND THROAT WITH OR WITHOUT PRESENCE OF ULCERS  *URINARY PROBLEMS  *BOWEL PROBLEMS  UNUSUAL RASH Items with * indicate a potential emergency and should be followed up as soon as possible.  Feel free to call the clinic should you have any questions or concerns. The clinic phone number is [phone].  Please show the CHEMO ALERT CARD at check-in to the Emergency Department and triage nurse.   

## 2017-12-23 NOTE — Telephone Encounter (Signed)
Chemo follow up call: Patient stated he is doing well. Patient stated he had mild nausea last night that has resolved. Patient instructed to call the office with any questions or concerns.

## 2017-12-24 ENCOUNTER — Inpatient Hospital Stay: Payer: BLUE CROSS/BLUE SHIELD

## 2017-12-24 ENCOUNTER — Other Ambulatory Visit: Payer: Self-pay | Admitting: Hematology

## 2017-12-24 VITALS — BP 140/74 | HR 80 | Temp 98.4°F | Resp 16

## 2017-12-24 DIAGNOSIS — Z5111 Encounter for antineoplastic chemotherapy: Secondary | ICD-10-CM | POA: Diagnosis not present

## 2017-12-24 DIAGNOSIS — Z7982 Long term (current) use of aspirin: Secondary | ICD-10-CM | POA: Diagnosis not present

## 2017-12-24 DIAGNOSIS — C6290 Malignant neoplasm of unspecified testis, unspecified whether descended or undescended: Secondary | ICD-10-CM

## 2017-12-24 DIAGNOSIS — C786 Secondary malignant neoplasm of retroperitoneum and peritoneum: Secondary | ICD-10-CM | POA: Diagnosis not present

## 2017-12-24 DIAGNOSIS — N400 Enlarged prostate without lower urinary tract symptoms: Secondary | ICD-10-CM | POA: Diagnosis not present

## 2017-12-24 DIAGNOSIS — E669 Obesity, unspecified: Secondary | ICD-10-CM | POA: Diagnosis not present

## 2017-12-24 DIAGNOSIS — Z79899 Other long term (current) drug therapy: Secondary | ICD-10-CM | POA: Diagnosis not present

## 2017-12-24 DIAGNOSIS — I10 Essential (primary) hypertension: Secondary | ICD-10-CM | POA: Diagnosis not present

## 2017-12-24 DIAGNOSIS — E119 Type 2 diabetes mellitus without complications: Secondary | ICD-10-CM | POA: Diagnosis not present

## 2017-12-24 LAB — BASIC METABOLIC PANEL
ANION GAP: 9 (ref 5–15)
BUN: 25 mg/dL — AB (ref 6–20)
CALCIUM: 9 mg/dL (ref 8.9–10.3)
CO2: 26 mmol/L (ref 22–32)
CREATININE: 0.72 mg/dL (ref 0.61–1.24)
Chloride: 102 mmol/L (ref 98–111)
GFR calc Af Amer: >60 mL/min (ref >60)
GLUCOSE: 168 mg/dL — AB (ref 70–99)
Potassium: 4.4 mmol/L (ref 3.5–5.1)
Sodium: 137 mmol/L (ref 135–145)

## 2017-12-24 LAB — MAGNESIUM: Magnesium: 2.4 mg/dL (ref 1.7–2.4)

## 2017-12-24 LAB — PHOSPHORUS: Phosphorus: 3.4 mg/dL (ref 2.5–4.6)

## 2017-12-24 MED ORDER — SODIUM CHLORIDE 0.9 % IV SOLN
Freq: Once | INTRAVENOUS | Status: AC
  Administered 2017-12-24: 11:00:00 via INTRAVENOUS
  Filled 2017-12-24: qty 5

## 2017-12-24 MED ORDER — POTASSIUM CHLORIDE 2 MEQ/ML IV SOLN
Freq: Once | INTRAVENOUS | Status: AC
  Administered 2017-12-24: 09:00:00 via INTRAVENOUS
  Filled 2017-12-24: qty 10

## 2017-12-24 MED ORDER — SODIUM CHLORIDE 0.9 % IV SOLN
Freq: Once | INTRAVENOUS | Status: AC
  Administered 2017-12-24: 08:00:00 via INTRAVENOUS
  Filled 2017-12-24: qty 250

## 2017-12-24 MED ORDER — PALONOSETRON HCL INJECTION 0.25 MG/5ML
INTRAVENOUS | Status: AC
  Filled 2017-12-24: qty 5

## 2017-12-24 MED ORDER — HEPARIN SOD (PORK) LOCK FLUSH 100 UNIT/ML IV SOLN
500.0000 [IU] | Freq: Once | INTRAVENOUS | Status: AC | PRN
  Administered 2017-12-24: 500 [IU]
  Filled 2017-12-24: qty 5

## 2017-12-24 MED ORDER — PALONOSETRON HCL INJECTION 0.25 MG/5ML
0.2500 mg | Freq: Once | INTRAVENOUS | Status: AC
  Administered 2017-12-24: 0.25 mg via INTRAVENOUS

## 2017-12-24 MED ORDER — LISINOPRIL 20 MG PO TABS: 20.0000 mg | ORAL_TABLET | Freq: Every day | ORAL | 1 refills | Status: DC

## 2017-12-24 MED ORDER — METOCLOPRAMIDE HCL 10 MG PO TABS: 10.0000 mg | ORAL_TABLET | Freq: Three times a day (TID) | ORAL | 0 refills | Status: DC | PRN

## 2017-12-24 MED ORDER — SODIUM CHLORIDE 0.9 % IV SOLN
100.0000 mg/m2 | Freq: Once | INTRAVENOUS | Status: AC
  Administered 2017-12-24: 260 mg via INTRAVENOUS
  Filled 2017-12-24: qty 13

## 2017-12-24 MED ORDER — PANTOPRAZOLE SODIUM 40 MG PO TBEC: 40.0000 mg | DELAYED_RELEASE_TABLET | Freq: Every day | ORAL | 0 refills | Status: DC

## 2017-12-24 MED ORDER — SODIUM CHLORIDE 0.9 % IV SOLN
20.0000 mg/m2 | Freq: Once | INTRAVENOUS | Status: AC
  Administered 2017-12-24: 52 mg via INTRAVENOUS
  Filled 2017-12-24: qty 52

## 2017-12-24 MED ORDER — SODIUM CHLORIDE 0.9% FLUSH
10.0000 mL | INTRAVENOUS | Status: DC | PRN
  Administered 2017-12-24: 10 mL
  Filled 2017-12-24: qty 10

## 2017-12-24 NOTE — Patient Instructions (Signed)
[facility] Discharge Instructions for Patients Receiving Chemotherapy  Today you received the following chemotherapy agents:  Etoposide and Cisplatin.  To help prevent nausea and vomiting after your treatment, we encourage you to take your nausea medication as directed.   If you develop nausea and vomiting that is not controlled by your nausea medication, call the clinic.   BELOW ARE SYMPTOMS THAT SHOULD BE REPORTED IMMEDIATELY:  *FEVER GREATER THAN 100.5 F  *CHILLS WITH OR WITHOUT FEVER  NAUSEA AND VOMITING THAT IS NOT CONTROLLED WITH YOUR NAUSEA MEDICATION  *UNUSUAL SHORTNESS OF BREATH  *UNUSUAL BRUISING OR BLEEDING  TENDERNESS IN MOUTH AND THROAT WITH OR WITHOUT PRESENCE OF ULCERS  *URINARY PROBLEMS  *BOWEL PROBLEMS  UNUSUAL RASH Items with * indicate a potential emergency and should be followed up as soon as possible.  Feel free to call the clinic should you have any questions or concerns. The clinic phone number is [phone].  Please show the CHEMO ALERT CARD at check-in to the Emergency Department and triage nurse.   

## 2017-12-25 ENCOUNTER — Inpatient Hospital Stay: Payer: BLUE CROSS/BLUE SHIELD

## 2017-12-25 VITALS — BP 140/70 | HR 85 | Temp 98.2°F | Resp 14

## 2017-12-25 DIAGNOSIS — E119 Type 2 diabetes mellitus without complications: Secondary | ICD-10-CM | POA: Diagnosis not present

## 2017-12-25 DIAGNOSIS — E669 Obesity, unspecified: Secondary | ICD-10-CM | POA: Diagnosis not present

## 2017-12-25 DIAGNOSIS — Z79899 Other long term (current) drug therapy: Secondary | ICD-10-CM | POA: Diagnosis not present

## 2017-12-25 DIAGNOSIS — I10 Essential (primary) hypertension: Secondary | ICD-10-CM | POA: Diagnosis not present

## 2017-12-25 DIAGNOSIS — Z7982 Long term (current) use of aspirin: Secondary | ICD-10-CM | POA: Diagnosis not present

## 2017-12-25 DIAGNOSIS — C786 Secondary malignant neoplasm of retroperitoneum and peritoneum: Secondary | ICD-10-CM | POA: Diagnosis not present

## 2017-12-25 DIAGNOSIS — Z5111 Encounter for antineoplastic chemotherapy: Secondary | ICD-10-CM | POA: Diagnosis not present

## 2017-12-25 DIAGNOSIS — C6290 Malignant neoplasm of unspecified testis, unspecified whether descended or undescended: Secondary | ICD-10-CM | POA: Diagnosis not present

## 2017-12-25 DIAGNOSIS — N400 Enlarged prostate without lower urinary tract symptoms: Secondary | ICD-10-CM | POA: Diagnosis not present

## 2017-12-25 MED ORDER — POTASSIUM CHLORIDE 2 MEQ/ML IV SOLN
Freq: Once | INTRAVENOUS | Status: AC
  Administered 2017-12-25: 09:00:00 via INTRAVENOUS
  Filled 2017-12-25: qty 10

## 2017-12-25 MED ORDER — SODIUM CHLORIDE 0.9 % IV SOLN
100.0000 mg/m2 | Freq: Once | INTRAVENOUS | Status: AC
  Administered 2017-12-25: 260 mg via INTRAVENOUS
  Filled 2017-12-25: qty 13

## 2017-12-25 MED ORDER — DEXAMETHASONE SODIUM PHOSPHATE 10 MG/ML IJ SOLN
INTRAMUSCULAR | Status: AC
  Filled 2017-12-25: qty 1

## 2017-12-25 MED ORDER — SODIUM CHLORIDE 0.9 % IV SOLN
20.0000 mg/m2 | Freq: Once | INTRAVENOUS | Status: AC
  Administered 2017-12-25: 52 mg via INTRAVENOUS
  Filled 2017-12-25: qty 52

## 2017-12-25 MED ORDER — DEXAMETHASONE SODIUM PHOSPHATE 10 MG/ML IJ SOLN
10.0000 mg | Freq: Once | INTRAMUSCULAR | Status: AC
  Administered 2017-12-25: 10 mg via INTRAVENOUS

## 2017-12-25 MED ORDER — SODIUM CHLORIDE 0.9 % IV SOLN
Freq: Once | INTRAVENOUS | Status: AC
  Administered 2017-12-25: 08:00:00 via INTRAVENOUS
  Filled 2017-12-25: qty 250

## 2017-12-25 MED ORDER — SODIUM CHLORIDE 0.9% FLUSH
10.0000 mL | INTRAVENOUS | Status: DC | PRN
  Administered 2017-12-25: 10 mL
  Filled 2017-12-25: qty 10

## 2017-12-25 MED ORDER — HEPARIN SOD (PORK) LOCK FLUSH 100 UNIT/ML IV SOLN
500.0000 [IU] | Freq: Once | INTRAVENOUS | Status: AC | PRN
  Administered 2017-12-25: 500 [IU]
  Filled 2017-12-25: qty 5

## 2017-12-25 NOTE — Patient Instructions (Signed)
[facility] Discharge Instructions for Patients Receiving Chemotherapy  Today you received the following chemotherapy agents:  Etoposide and Cisplatin.  To help prevent nausea and vomiting after your treatment, we encourage you to take your nausea medication as directed.   If you develop nausea and vomiting that is not controlled by your nausea medication, call the clinic.   BELOW ARE SYMPTOMS THAT SHOULD BE REPORTED IMMEDIATELY:  *FEVER GREATER THAN 100.5 F  *CHILLS WITH OR WITHOUT FEVER  NAUSEA AND VOMITING THAT IS NOT CONTROLLED WITH YOUR NAUSEA MEDICATION  *UNUSUAL SHORTNESS OF BREATH  *UNUSUAL BRUISING OR BLEEDING  TENDERNESS IN MOUTH AND THROAT WITH OR WITHOUT PRESENCE OF ULCERS  *URINARY PROBLEMS  *BOWEL PROBLEMS  UNUSUAL RASH Items with * indicate a potential emergency and should be followed up as soon as possible.  Feel free to call the clinic should you have any questions or concerns. The clinic phone number is [phone].  Please show the CHEMO ALERT CARD at check-in to the Emergency Department and triage nurse.   

## 2017-12-26 ENCOUNTER — Inpatient Hospital Stay: Payer: BLUE CROSS/BLUE SHIELD

## 2017-12-26 VITALS — BP 125/64 | HR 79 | Temp 98.5°F | Resp 18

## 2017-12-26 DIAGNOSIS — Z7982 Long term (current) use of aspirin: Secondary | ICD-10-CM | POA: Diagnosis not present

## 2017-12-26 DIAGNOSIS — Z5111 Encounter for antineoplastic chemotherapy: Secondary | ICD-10-CM | POA: Diagnosis not present

## 2017-12-26 DIAGNOSIS — E119 Type 2 diabetes mellitus without complications: Secondary | ICD-10-CM | POA: Diagnosis not present

## 2017-12-26 DIAGNOSIS — I10 Essential (primary) hypertension: Secondary | ICD-10-CM | POA: Diagnosis not present

## 2017-12-26 DIAGNOSIS — C6290 Malignant neoplasm of unspecified testis, unspecified whether descended or undescended: Secondary | ICD-10-CM

## 2017-12-26 DIAGNOSIS — C786 Secondary malignant neoplasm of retroperitoneum and peritoneum: Secondary | ICD-10-CM | POA: Diagnosis not present

## 2017-12-26 DIAGNOSIS — Z79899 Other long term (current) drug therapy: Secondary | ICD-10-CM | POA: Diagnosis not present

## 2017-12-26 DIAGNOSIS — N400 Enlarged prostate without lower urinary tract symptoms: Secondary | ICD-10-CM | POA: Diagnosis not present

## 2017-12-26 DIAGNOSIS — E669 Obesity, unspecified: Secondary | ICD-10-CM | POA: Diagnosis not present

## 2017-12-26 MED ORDER — SODIUM CHLORIDE 0.9 % IV SOLN
20.0000 mg/m2 | Freq: Once | INTRAVENOUS | Status: AC
  Administered 2017-12-26: 52 mg via INTRAVENOUS
  Filled 2017-12-26: qty 52

## 2017-12-26 MED ORDER — HEPARIN SOD (PORK) LOCK FLUSH 100 UNIT/ML IV SOLN
500.0000 [IU] | Freq: Once | INTRAVENOUS | Status: AC | PRN
  Administered 2017-12-26: 500 [IU]
  Filled 2017-12-26: qty 5

## 2017-12-26 MED ORDER — SODIUM CHLORIDE 0.9 % IV SOLN
Freq: Once | INTRAVENOUS | Status: AC
  Administered 2017-12-26: 12:00:00 via INTRAVENOUS
  Filled 2017-12-26: qty 5

## 2017-12-26 MED ORDER — POTASSIUM CHLORIDE 2 MEQ/ML IV SOLN
Freq: Once | INTRAVENOUS | Status: AC
  Administered 2017-12-26: 09:00:00 via INTRAVENOUS
  Filled 2017-12-26: qty 10

## 2017-12-26 MED ORDER — SODIUM CHLORIDE 0.9% FLUSH
10.0000 mL | INTRAVENOUS | Status: DC | PRN
  Administered 2017-12-26: 10 mL
  Filled 2017-12-26: qty 10

## 2017-12-26 MED ORDER — SODIUM CHLORIDE 0.9 % IV SOLN
100.0000 mg/m2 | Freq: Once | INTRAVENOUS | Status: AC
  Administered 2017-12-26: 260 mg via INTRAVENOUS
  Filled 2017-12-26: qty 13

## 2017-12-26 MED ORDER — SODIUM CHLORIDE 0.9 % IV SOLN
Freq: Once | INTRAVENOUS | Status: AC
  Administered 2017-12-26: 09:00:00 via INTRAVENOUS
  Filled 2017-12-26: qty 250

## 2017-12-26 MED ORDER — PALONOSETRON HCL INJECTION 0.25 MG/5ML
INTRAVENOUS | Status: AC
  Filled 2017-12-26: qty 5

## 2017-12-26 MED ORDER — PALONOSETRON HCL INJECTION 0.25 MG/5ML
0.2500 mg | Freq: Once | INTRAVENOUS | Status: AC
  Administered 2017-12-26: 0.25 mg via INTRAVENOUS

## 2017-12-26 NOTE — Patient Instructions (Signed)
[facility] Discharge Instructions for Patients Receiving Chemotherapy  Today you received the following chemotherapy agents Cisplatin and Etoposide  To help prevent nausea and vomiting after your treatment, we encourage you to take your nausea medication as directed.   If you develop nausea and vomiting that is not controlled by your nausea medication, call the clinic.   BELOW ARE SYMPTOMS THAT SHOULD BE REPORTED IMMEDIATELY:  *FEVER GREATER THAN 100.5 F  *CHILLS WITH OR WITHOUT FEVER  NAUSEA AND VOMITING THAT IS NOT CONTROLLED WITH YOUR NAUSEA MEDICATION  *UNUSUAL SHORTNESS OF BREATH  *UNUSUAL BRUISING OR BLEEDING  TENDERNESS IN MOUTH AND THROAT WITH OR WITHOUT PRESENCE OF ULCERS  *URINARY PROBLEMS  *BOWEL PROBLEMS  UNUSUAL RASH Items with * indicate a potential emergency and should be followed up as soon as possible.  Feel free to call the clinic should you have any questions or concerns. The clinic phone number is [phone].  Please show the CHEMO ALERT CARD at check-in to the Emergency Department and triage nurse.   

## 2017-12-29 ENCOUNTER — Inpatient Hospital Stay: Payer: BLUE CROSS/BLUE SHIELD

## 2017-12-29 VITALS — BP 124/80 | HR 83 | Temp 98.4°F | Resp 18

## 2017-12-29 DIAGNOSIS — Z79899 Other long term (current) drug therapy: Secondary | ICD-10-CM | POA: Diagnosis not present

## 2017-12-29 DIAGNOSIS — Z5111 Encounter for antineoplastic chemotherapy: Secondary | ICD-10-CM | POA: Diagnosis not present

## 2017-12-29 DIAGNOSIS — Z7982 Long term (current) use of aspirin: Secondary | ICD-10-CM | POA: Diagnosis not present

## 2017-12-29 DIAGNOSIS — C6290 Malignant neoplasm of unspecified testis, unspecified whether descended or undescended: Secondary | ICD-10-CM

## 2017-12-29 DIAGNOSIS — E119 Type 2 diabetes mellitus without complications: Secondary | ICD-10-CM | POA: Diagnosis not present

## 2017-12-29 DIAGNOSIS — I10 Essential (primary) hypertension: Secondary | ICD-10-CM | POA: Diagnosis not present

## 2017-12-29 DIAGNOSIS — E669 Obesity, unspecified: Secondary | ICD-10-CM | POA: Diagnosis not present

## 2017-12-29 DIAGNOSIS — C786 Secondary malignant neoplasm of retroperitoneum and peritoneum: Secondary | ICD-10-CM | POA: Diagnosis not present

## 2017-12-29 DIAGNOSIS — N400 Enlarged prostate without lower urinary tract symptoms: Secondary | ICD-10-CM | POA: Diagnosis not present

## 2017-12-29 MED ORDER — PEGFILGRASTIM-CBQV 6 MG/0.6ML ~~LOC~~ SOSY
6.0000 mg | PREFILLED_SYRINGE | Freq: Once | SUBCUTANEOUS | Status: AC
  Administered 2017-12-29: 6 mg via SUBCUTANEOUS

## 2017-12-29 NOTE — Patient Instructions (Signed)
Pegfilgrastim injection What is this medicine? PEGFILGRASTIM (PEG fil gra stim) is a long-acting granulocyte colony-stimulating factor that stimulates the growth of neutrophils, a type of white blood cell important in the body's fight against infection. It is used to reduce the incidence of fever and infection in patients with certain types of cancer who are receiving chemotherapy that affects the bone marrow, and to increase survival after being exposed to high doses of radiation. This medicine may be used for other purposes; ask your health care provider or pharmacist if you have questions. COMMON BRAND NAME(S): Neulasta What should I tell my health care provider before I take this medicine? They need to know if you have any of these conditions: -kidney disease -latex allergy -ongoing radiation therapy -sickle cell disease -skin reactions to acrylic adhesives (On-Body Injector only) -an unusual or allergic reaction to pegfilgrastim, filgrastim, other medicines, foods, dyes, or preservatives -pregnant or trying to get pregnant -breast-feeding How should I use this medicine? This medicine is for injection under the skin. If you get this medicine at home, you will be taught how to prepare and give the pre-filled syringe or how to use the On-body Injector. Refer to the patient Instructions for Use for detailed instructions. Use exactly as directed. Tell your healthcare provider immediately if you suspect that the On-body Injector may not have performed as intended or if you suspect the use of the On-body Injector resulted in a missed or partial dose. It is important that you put your used needles and syringes in a special sharps container. Do not put them in a trash can. If you do not have a sharps container, call your pharmacist or healthcare provider to get one. Talk to your pediatrician regarding the use of this medicine in children. While this drug may be prescribed for selected conditions,  precautions do apply. Overdosage: If you think you have taken too much of this medicine contact a poison control center or emergency room at once. NOTE: This medicine is only for you. Do not share this medicine with others. What if I miss a dose? It is important not to miss your dose. Call your doctor or health care professional if you miss your dose. If you miss a dose due to an On-body Injector failure or leakage, a new dose should be administered as soon as possible using a single prefilled syringe for manual use. What may interact with this medicine? Interactions have not been studied. Give your health care provider a list of all the medicines, herbs, non-prescription drugs, or dietary supplements you use. Also tell them if you smoke, drink alcohol, or use illegal drugs. Some items may interact with your medicine. This list may not describe all possible interactions. Give your health care provider a list of all the medicines, herbs, non-prescription drugs, or dietary supplements you use. Also tell them if you smoke, drink alcohol, or use illegal drugs. Some items may interact with your medicine. What should I watch for while using this medicine? You may need blood work done while you are taking this medicine. If you are going to need a MRI, CT scan, or other procedure, tell your doctor that you are using this medicine (On-Body Injector only). What side effects may I notice from receiving this medicine? Side effects that you should report to your doctor or health care professional as soon as possible: -allergic reactions like skin rash, itching or hives, swelling of the face, lips, or tongue -dizziness -fever -pain, redness, or irritation at site   where injected -pinpoint red spots on the skin -red or dark-brown urine -shortness of breath or breathing problems -stomach or side pain, or pain at the shoulder -swelling -tiredness -trouble passing urine or change in the amount of urine Side  effects that usually do not require medical attention (report to your doctor or health care professional if they continue or are bothersome): -bone pain -muscle pain This list may not describe all possible side effects. Call your doctor for medical advice about side effects. You may report side effects to FDA at 1-800-FDA-1088. Where should I keep my medicine? Keep out of the reach of children. Store pre-filled syringes in a refrigerator between 2 and 8 degrees C (36 and 46 degrees F). Do not freeze. Keep in carton to protect from light. Throw away this medicine if it is left out of the refrigerator for more than 48 hours. Throw away any unused medicine after the expiration date. NOTE: This sheet is a summary. It may not cover all possible information. If you have questions about this medicine, talk to your doctor, pharmacist, or health care provider.  2018 Elsevier/Gold Standard (2016-03-28 12:58:03)  

## 2018-01-01 ENCOUNTER — Other Ambulatory Visit: Payer: Self-pay

## 2018-01-01 DIAGNOSIS — C6290 Malignant neoplasm of unspecified testis, unspecified whether descended or undescended: Secondary | ICD-10-CM

## 2018-01-01 NOTE — Progress Notes (Signed)
HEMATOLOGY/ONCOLOGY CLNIC NOTE  Date of Service: 01/02/2018  Patient Care Team: Antony Contras, MD as PCP - General (Family Medicine)  CHIEF COMPLAINTS/PURPOSE OF CONSULTATION:  F/u for extragondal seminoma  HISTORY OF PRESENTING ILLNESS:   Neil Berg is a wonderful 57 y.o. male who has been referred to [place] by Dr. Antony Contras for evaluation and management of Lymphadenopathy. He is accompanied today by his wife. The pt reports that he is doing well overall and [place]   The pt reports that he hasn't had any pain in over a week and recently appeared to [facility] on 10/17/17. He notes that he woke up on the morning of 10/17/17 with significant, unique, back and kidney pain which began to spread around to the RUQ of his abdomen as the morning progressed. He notes that his pain went away after taking pain medications for 5 days, and describes that the pain was waxing and waning. He also endorses some positional exacerbation to his pain. He denies any nausea, vomiting, diarrhea, changes in bowel habits, testicular pain or swelling, difficulty/changes in urination at the time or since then.    The pt also notes that  In the week of these symptoms he felt very cold, which is different for him as he describes himself as very warm natured. He also notes that his appetite weakened and he lost about 10 pounds, and was abiding by the [employer]. He notes that his appetite has returned and he is eating normally again. He denies any pain associated with meals. He denies recent exotic or international travel  He takes Jardiance for his DM. He notes that his PSA spiked two years ago, had a biopsy, and his PSA counts have decreased recently and is believed to be BPH.    He also notes that he had a recurring rash on his calves, worse on the left, that was red and flat, mimicking his sock impression, but extending further up his leg. He notes that this happened over  3 months, and that it came and went without any discernable pattern. He denies any associated itching.   He also notes a place on the front of his left lower leg that has been slightly swollen for the past a week. He denies remembering running into anything, bruising, or pain.   Of note prior to the patient's visit today, pt has had CT A/P completed on 10/14/17 with results revealing Retroperitoneal lymphadenopathy, highly suspicious for lymphoma or metastatic disease. 2. Prostate enlargement 3.  Aortic Atherosclerosis.   Most recent lab results (10/17/17) of CBC w/diff is as follows: all values are WNL except for WBC at 13.9k, RBC at 5.83, ANC at 10.0k, Monocytes abs at 1.4k.  On review of systems, pt reports recent back pain, feeling cold recently, recent abdominal pain, and denies nausea, vomiting, diarrhea, changes in bowel habits, difficulty/changes in urination, blood in the urine, abdominal trauma, constipation, fevers, night sweats, chills, testicular pain or swelling, noticing any lumps or bumps, unexpected weight loss, and any other symptoms.   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  On Social Hx the pt reports working in [employer] and denies chemical/radiaiton exposure. He denies ever smoking and other drug use.  Interval History:   Neil Berg returns today for management and evaluation of his newly diagnosed Stage  II extra-gonadal retroperitoneal Seminoma. The patient's last visit with [place] was on 12/08/17. The pt reports that he is doing well overall.   The pt reports that he lost his voice two days after his last treatment, on 12/28/17. He denies any coughs or throat pain. He also notes that his left nostril has been a little raw and has shown some blood but denies explicit nose bleeds. He notes that he had some fatigue.   The pt denies any bone pains  after receiving his Neulasta injection. The pt notes that he is not working, has taken time off and has limited his crowd exposure.   The pt notes that he has not noticed any changes in his hearing and denies any tinnitus as well.   The pt notes that he has had noticed that his acute back pain over the last 3 months has occurred on the fifth of the month, and is reflecting a cyclical pattern.   Lab results today (01/02/18) of CBC w/diff, CMP, and Reticulocytes is as follows: all values are WNL except for WBC at 1.8k, PLT at 107k, ANC at 300, Glucose at 171, Calcium at 8.7, Total Protein at 6.3, Albumin at 3.3, AST at 6. 01/02/18 Magnesium is WNL at 1.7 01/02/18 LDH is WNL at 117  On review of systems, pt reports some fatigue, weak voice, and denies headaches, diarrhea, skin rashes, leg swelling, peripheral neuropathy, fevers, chills, night sweats, SOB, coughs, bone pains, sore throat, tinnitus, hearing loss, current back pain, runny nose, sinus pain, concerns for infections, nasal discharge, pain along the spine, abdominal pains, leg swelling, and any other symptoms.   MEDICAL HISTORY:  Past Medical History:  Diagnosis Date  . Chest pain   . Diabetes mellitus without complication ([place])   . Hypertension   . Obesity   Type II Diabetes Mellitus   SURGICAL HISTORY: Past Surgical History:  Procedure Laterality Date  . EYE SURGERY     left eye cataract removal / implant   . IR IMAGING GUIDED PORT INSERTION  12/11/2017  . TONSILLECTOMY      Tonsillectomy 1974 Cataract surgery-left eye 04/2015 Colonoscopy 06/04/16  SOCIAL HISTORY: Social History   Socioeconomic History  . Marital status: Married    Spouse name: Not on file  . Number of children: Not on file  . Years of education: Not on file  . Highest education level: Not on file  Occupational History  . Not on file  Social Needs  . Financial resource strain: Not on file  . Food insecurity:    Worry: Not on file    Inability: Not  on file  . Transportation needs:    Medical: Not on file    Non-medical: Not on file  Tobacco Use  . Smoking status: Never Smoker  . Smokeless tobacco: Never Used  Substance and Sexual Activity  . Alcohol use: No  . Drug use: No  . Sexual activity: Not on file  Lifestyle  . Physical activity:    Days per week: Not on file    Minutes per session: Not on file  . Stress: Not on file  Relationships  . Social connections:    Talks on phone: Not on file    Gets together: Not on file    Attends religious service: Not on file    Active member of club or organization: Not on file    Attends meetings of clubs or organizations: Not on file    Relationship status: Not on file  .  Intimate partner violence:    Fear of current or ex partner: Not on file    Emotionally abused: Not on file    Physically abused: Not on file    Forced sexual activity: Not on file  Other Topics Concern  . Not on file  Social History Narrative  . Not on file    FAMILY HISTORY: Family History  Problem Relation Age of Onset  . Heart disease Maternal Grandmother   . Heart attack Maternal Grandfather     ALLERGIES:  has No Known Allergies.  MEDICATIONS:  Current Outpatient Medications  Medication Sig Dispense Refill  . amLODipine (NORVASC) 5 MG tablet Take 1 tablet (5 mg total) by mouth daily. 30 tablet 11  . amoxicillin-clavulanate (AUGMENTIN) 875-125 MG tablet Take 1 tablet by mouth 2 (two) times daily. 20 tablet 0  . aspirin EC 81 MG tablet Take 81 mg by mouth daily.    Marland Kitchen b complex vitamins tablet Take 1 tablet by mouth daily.    Marland Kitchen dexamethasone (DECADRON) 4 MG tablet Take 2 tablets by mouth once a day on the day starting on day 6 of chemotherapy and then take 2 tablets two times a day for 2 days. 30 tablet 1  . HYDROcodone-acetaminophen (NORCO/VICODIN) 5-325 MG tablet Take 1 tablet by mouth every 6 (six) hours as needed for moderate pain or severe pain. 30 tablet 0  . JARDIANCE 25 MG TABS tablet TK 1  T PO QAM  0  . lidocaine-prilocaine (EMLA) cream Apply to affected area once 30 g 3  . lisinopril (PRINIVIL,ZESTRIL) 20 MG tablet Take 1 tablet (20 mg total) by mouth daily. (Instead of lisinopril/HCTZ combination) 30 tablet 1  . LORazepam (ATIVAN) 0.5 MG tablet Take 1 tablet (0.5 mg total) by mouth every 6 (six) hours as needed (Nausea or vomiting). 30 tablet 0  . metoCLOPramide (REGLAN) 10 MG tablet Take 1 tablet (10 mg total) by mouth every 8 (eight) hours as needed (nausea and hiccups). 20 tablet 0  . ondansetron (ZOFRAN) 8 MG tablet Take 1 tablet (8 mg total) by mouth 2 (two) times daily as needed. Start on day 8 of chemotherapy. 30 tablet 1  . pantoprazole (PROTONIX) 40 MG tablet Take 1 tablet (40 mg total) by mouth daily. 30 tablet 0  . prochlorperazine (COMPAZINE) 10 MG tablet Take 1 tablet (10 mg total) by mouth every 6 (six) hours as needed (Nausea or vomiting). 30 tablet 1   No current facility-administered medications for this visit.     REVIEW OF SYSTEMS:    A 10+ POINT REVIEW OF SYSTEMS WAS OBTAINED including neurology, dermatology, psychiatry, cardiac, respiratory, lymph, extremities, GI, GU, Musculoskeletal, constitutional, breasts, reproductive, HEENT.  All pertinent positives are noted in the HPI.  All others are negative.    PHYSICAL EXAMINATION: ECOG PERFORMANCE STATUS: 1 - Symptomatic but completely ambulatory  Vitals:   01/02/18 1221  BP: 129/84  Pulse: 92  Resp: 18  Temp: 98.4 F (36.9 C)  SpO2: 99%   Filed Weights   01/02/18 1221  Weight: 279 lb 14.4 oz (127 kg)   .Body mass index is 37.96 kg/m.  GENERAL:alert, in no acute distress and comfortable SKIN: no acute rashes, no significant lesions EYES: conjunctiva are pink and non-injected, sclera anicteric OROPHARYNX: MMM, no exudates, no oropharyngeal erythema or ulceration NECK: supple, no JVD LYMPH:  no palpable lymphadenopathy in the cervical, axillary or inguinal regions LUNGS: clear to auscultation  b/l with normal respiratory effort HEART: regular rate & rhythm  ABDOMEN:  normoactive bowel sounds , non tender, not distended. No palpable hepatosplenomegaly.  TESTES: no obvious palpable testicular mass Extremity: no pedal edema PSYCH: alert & oriented x 3 with fluent speech NEURO: no focal motor/sensory deficits   LABORATORY DATA:  I have reviewed the data as listed  . CBC Latest Ref Rng & Units 01/02/2018 12/22/2017 12/11/2017  WBC 4.0 - 10.3 K/uL 1.8(L) 9.0 8.2  Hemoglobin 13.0 - 17.1 g/dL 14.6 15.1 15.6  Hematocrit 38.4 - 49.9 % 43.9 44.9 46.9  Platelets 140 - 400 K/uL 107(L) 283 294  ANC 300  . CMP Latest Ref Rng & Units 01/02/2018 12/24/2017 12/22/2017  Glucose 70 - 99 mg/dL 171(H) 168(H) 158(H)  BUN 6 - 20 mg/dL 18 25(H) 18  Creatinine 0.61 - 1.24 mg/dL 0.68 0.72 0.76  Sodium 135 - 145 mmol/L 138 137 137  Potassium 3.5 - 5.1 mmol/L 4.2 4.4 4.2  Chloride 98 - 111 mmol/L 103 102 99  CO2 22 - 32 mmol/L 25 26 28   Calcium 8.9 - 10.3 mg/dL 8.7(L) 9.0 9.5  Total Protein 6.5 - 8.1 g/dL 6.3(L) - 7.7  Total Bilirubin 0.3 - 1.2 mg/dL 0.8 - 0.7  Alkaline Phos 38 - 126 U/L 106 - 111  AST 15 - 41 U/L 6(L) - 11(L)  ALT 0 - 44 U/L 13 - 14    Pathology:   11/25/17 Tissue Flow Cytometry:    RADIOGRAPHIC STUDIES: I have personally reviewed the radiological images as listed and agreed with the findings in the report. US Scrotum W/doppler  Result Date: 12/04/2017 CLINICAL DATA:  57 year old male with history of seminoma on retroperitoneal lymph node biopsy. Evaluate for testicular tumor. EXAM: SCROTAL ULTRASOUND DOPPLER ULTRASOUND OF THE TESTICLES TECHNIQUE: Complete ultrasound examination of the testicles, epididymis, and other scrotal structures was performed. Color and spectral Doppler ultrasound were also utilized to evaluate blood flow to the testicles. COMPARISON:  None. FINDINGS: Right testicle Measurements: 4.5 x 1.9 x 2.4 cm. No mass or microlithiasis visualized. Left testicle  Measurements:  3.5 x 1.4 cm.  No mass or microlithiasis visualized. Right epididymis:  Normal in size and appearance. Left epididymis: Normal in size and appearance. Simple anechoic cyst measuring 5 x 3 x 5 mm present at the left epididymal head, most consistent with a small benign epididymal cyst. Hydrocele:  None visualized. Varicocele:  None visualized. Pulsed Doppler interrogation of both testes demonstrates normal low resistance arterial and venous waveforms bilaterally. IMPRESSION: 1. Negative scrotal ultrasound with no distinct testicular mass identified. 2. 5 mm simple left epididymal cyst, almost certainly benign and felt to be incidental in nature. Electronically Signed   By: Jeannine Boga M.D.   On: 12/04/2017 20:50   Ir Imaging Guided Port Insertion  Result Date: 12/11/2017 CLINICAL DATA:  Seminoma EXAM: TUNNEL POWER PORT PLACEMENT WITH SUBCUTANEOUS POCKET UTILIZING ULTRASOUND & FLOUROSCOPY FLUOROSCOPY TIME:  12 seconds.  5.1 mGy. MEDICATIONS AND MEDICAL HISTORY: Versed 4 mg, Fentanyl 100 mcg. Additional Medications: Ancef 2 g. Antibiotics were given within 2 hours of the procedure. ANESTHESIA/SEDATION: Moderate sedation time: 26 minutes. Nursing monitored the the patient during the procedure. PROCEDURE: After written informed consent was obtained, patient was placed in the supine position on angiographic table. The right neck and chest was prepped and draped in a sterile fashion. Lidocaine was utilized for local anesthesia. The right jugular vein was noted to be patent initially with ultrasound. Under sonographic guidance, a micropuncture needle was inserted into the right IJ vein (Ultrasound and fluoroscopic image  documentation was performed). The needle was removed over an 018 wire which was exchanged for a Amplatz. This was advanced into the IVC. An 8-French dilator was advanced over the Amplatz. A small incision was made in the right upper chest over the anterior right second rib.  Utilizing blunt dissection, a subcutaneous pocket was created in the caudal direction. The pocket was irrigated with a copious amount of sterile normal saline. The port catheter was tunneled from the chest incision, and out the neck incision. The reservoir was inserted into the subcutaneous pocket and secured with two 3-0 Ethilon stitches. A peel-away sheath was advanced over the Amplatz wire. The port catheter was cut to measure length and inserted through the peel-away sheath. The peel-away sheath was removed. The chest incision was closed with 3-0 Vicryl interrupted stitches for the subcutaneous tissue and a running of 4-0 Vicryl subcuticular stitch for the skin. The neck incision was closed with a 4-0 Vicryl subcuticular stitch. Derma-bond was applied to both surgical incisions. The port reservoir was flushed and instilled with heparinized saline. No complications. FINDINGS: A right IJ vein Port-A-Cath is in place with its tip at the cavoatrial junction. COMPLICATIONS: None IMPRESSION: Successful 8 French right internal jugular vein power port placement with its tip at the SVC/RA junction. Electronically Signed   By: Marybelle Killings M.D.   On: 12/11/2017 12:41    ASSESSMENT & PLAN:   57 y.o. male with  1. 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 Prostate enlargement.  11/17/17 PET/CT revealed Isolated hypermetabolic retroperitoneal lymphadenopathy. Findings suspicious for lymphoma as no primary lesion is identified to suggest this is metastatic adenopathy. Recommend retroperitoneal biopsy. The largest left-sided node appears necrotic and is actually decreased in size since the prior CT scan. It appeared inflamed on the prior study and may have infarcted. I would not recommend  biopsying this lesion. No adenopathy in the neck, axilla, chest, pelvis or inguinal regions. No worrisome pulmonary lesions or osseous lesions.  11/25/17 pathology results revealed  finding of metastatic seminoma    12/04/17 US Scrotum w/doppler revealed Negative scrotal ultrasound with no distinct testicular mass Identified. 2. 5 mm simple left epididymal cyst, almost certainly benign and felt to be incidental in nature.   PLAN:   -Discussed pt labwork today, 01/02/18; neutropenic with ANC at 300, PLT at 107k, no anemia. Renal functions are normal. LDH has normalized to 117.  -Advised that the pt avoid crowds and abide by strict infection prevention precautions as he is neutropenic -Pt did receive Neulasta a few days ago and do expect his neutropenia to resolve quickly -Begin salt and baking soda mouth washes  -Will order Augmentin and pt will take this if he develops any concerns for infection whatsoever and seek immediate medical attention. -Advised that if pt develops any fevers, he is to present directly to the ED -Recommended that the pt continue to eat well, drink at least 48-64 oz of water each day, and walk 20-30 minutes each day.  -Provided CDC supplemental information regarding neutropenia    Please schedule C2 of EP and neulasta as ordered from 9/30 with labs and MD visit    All of the patients questions were answered with apparent satisfaction. The patient knows to call the clinic with any problems, questions or concerns.  The total time spent in  the appt was 25 minutes and more than 50% was on counseling and direct patient cares.    Sullivan Lone MD MS AAHIVMS [facility] Hematology/Oncology Physician [facility]  (Office):       [phone] (Work cell):  [phone] (Fax):           [phone]  01/02/2018 12:59 PM  I, Baldwin [place], am acting as a scribe for Dr. Irene Limbo  .I have reviewed the above documentation for accuracy and completeness, and I agree with  the above. Brunetta Genera MD

## 2018-01-02 ENCOUNTER — Telehealth: Payer: Self-pay

## 2018-01-02 ENCOUNTER — Inpatient Hospital Stay (HOSPITAL_BASED_OUTPATIENT_CLINIC_OR_DEPARTMENT_OTHER): Payer: BLUE CROSS/BLUE SHIELD | Admitting: Hematology

## 2018-01-02 ENCOUNTER — Inpatient Hospital Stay: Payer: BLUE CROSS/BLUE SHIELD

## 2018-01-02 VITALS — BP 129/84 | HR 92 | Temp 98.4°F | Resp 18 | Ht 72.0 in | Wt 279.9 lb

## 2018-01-02 DIAGNOSIS — E669 Obesity, unspecified: Secondary | ICD-10-CM | POA: Diagnosis not present

## 2018-01-02 DIAGNOSIS — D701 Agranulocytosis secondary to cancer chemotherapy: Secondary | ICD-10-CM

## 2018-01-02 DIAGNOSIS — E119 Type 2 diabetes mellitus without complications: Secondary | ICD-10-CM | POA: Diagnosis not present

## 2018-01-02 DIAGNOSIS — Z5111 Encounter for antineoplastic chemotherapy: Secondary | ICD-10-CM | POA: Diagnosis not present

## 2018-01-02 DIAGNOSIS — Z79899 Other long term (current) drug therapy: Secondary | ICD-10-CM | POA: Diagnosis not present

## 2018-01-02 DIAGNOSIS — C6290 Malignant neoplasm of unspecified testis, unspecified whether descended or undescended: Secondary | ICD-10-CM

## 2018-01-02 DIAGNOSIS — C786 Secondary malignant neoplasm of retroperitoneum and peritoneum: Secondary | ICD-10-CM

## 2018-01-02 DIAGNOSIS — I10 Essential (primary) hypertension: Secondary | ICD-10-CM | POA: Diagnosis not present

## 2018-01-02 DIAGNOSIS — Z95828 Presence of other vascular implants and grafts: Secondary | ICD-10-CM

## 2018-01-02 DIAGNOSIS — Z7982 Long term (current) use of aspirin: Secondary | ICD-10-CM | POA: Diagnosis not present

## 2018-01-02 DIAGNOSIS — N400 Enlarged prostate without lower urinary tract symptoms: Secondary | ICD-10-CM | POA: Diagnosis not present

## 2018-01-02 DIAGNOSIS — T451X5A Adverse effect of antineoplastic and immunosuppressive drugs, initial encounter: Secondary | ICD-10-CM

## 2018-01-02 LAB — CBC WITH DIFFERENTIAL (CANCER CENTER ONLY)
BASOS ABS: 0 10*3/uL (ref 0.0–0.1)
Basophils Relative: 0 %
Eosinophils Absolute: 0 10*3/uL (ref 0.0–0.5)
Eosinophils Relative: 2 %
HEMATOCRIT: 43.9 % (ref 38.4–49.9)
Hemoglobin: 14.6 g/dL (ref 13.0–17.1)
LYMPHS ABS: 1.4 10*3/uL (ref 0.9–3.3)
Lymphocytes Relative: 76 %
MCH: 27.7 pg (ref 27.2–33.4)
MCHC: 33.2 g/dL (ref 32.0–36.0)
MCV: 83.3 fL (ref 79.3–98.0)
MONO ABS: 0.1 10*3/uL (ref 0.1–0.9)
Monocytes Relative: 7 %
NEUTROS ABS: 0.3 10*3/uL — AB (ref 1.5–6.5)
Neutrophils Relative %: 15 %
PLATELETS: 107 10*3/uL — AB (ref 140–400)
RBC: 5.27 MIL/uL (ref 4.20–5.82)
RDW: 13.4 % (ref 11.0–14.6)
WBC Count: 1.8 10*3/uL — ABNORMAL LOW (ref 4.0–10.3)

## 2018-01-02 LAB — CMP (CANCER CENTER ONLY)
ALBUMIN: 3.3 g/dL — AB (ref 3.5–5.0)
ALT: 13 U/L (ref 0–44)
ANION GAP: 10 (ref 5–15)
AST: 6 U/L — AB (ref 15–41)
Alkaline Phosphatase: 106 U/L (ref 38–126)
BILIRUBIN TOTAL: 0.8 mg/dL (ref 0.3–1.2)
BUN: 18 mg/dL (ref 6–20)
CHLORIDE: 103 mmol/L (ref 98–111)
CO2: 25 mmol/L (ref 22–32)
Calcium: 8.7 mg/dL — ABNORMAL LOW (ref 8.9–10.3)
Creatinine: 0.68 mg/dL (ref 0.61–1.24)
GFR, Est AFR Am: >60 mL/min (ref >60)
GFR, Estimated: >60 mL/min (ref >60)
GLUCOSE: 171 mg/dL — AB (ref 70–99)
POTASSIUM: 4.2 mmol/L (ref 3.5–5.1)
SODIUM: 138 mmol/L (ref 135–145)
TOTAL PROTEIN: 6.3 g/dL — AB (ref 6.5–8.1)

## 2018-01-02 LAB — MAGNESIUM: MAGNESIUM: 1.7 mg/dL (ref 1.7–2.4)

## 2018-01-02 LAB — LACTATE DEHYDROGENASE: LDH: 117 U/L (ref 98–192)

## 2018-01-02 MED ORDER — AMOXICILLIN-POT CLAVULANATE 875-125 MG PO TABS: 1.0000 | ORAL_TABLET | Freq: Two times a day (BID) | ORAL | 0 refills | Status: DC

## 2018-01-02 NOTE — Telephone Encounter (Signed)
Dr. Irene Limbo is aware of ANC 0.3

## 2018-01-02 NOTE — Telephone Encounter (Signed)
Printed avs and calender of upcoming appointment. Per 9/20 los 

## 2018-01-05 ENCOUNTER — Telehealth: Payer: Self-pay

## 2018-01-05 NOTE — Telephone Encounter (Signed)
Called and verified appointment. Added for patient on 10/3 for 7.5 hours by [facility]. Per 9/23 approved

## 2018-01-09 NOTE — Progress Notes (Signed)
HEMATOLOGY/ONCOLOGY CLNIC NOTE  Date of Service: 01/12/2018  Patient Care Team: Antony Contras, MD as PCP - General (Family Medicine)  CHIEF COMPLAINTS/PURPOSE OF CONSULTATION:  F/u for extragondal seminoma  HISTORY OF PRESENTING ILLNESS:   Neil Berg is a wonderful 57 y.o. male who has been referred to [place] by Dr. Antony Contras for evaluation and management of Lymphadenopathy. He is accompanied today by his wife. The pt reports that he is doing well overall and [place]   The pt reports that he hasn't had any pain in over a week and recently appeared to [facility] on 10/17/17. He notes that he woke up on the morning of 10/17/17 with significant, unique, back and kidney pain which began to spread around to the RUQ of his abdomen as the morning progressed. He notes that his pain went away after taking pain medications for 5 days, and describes that the pain was waxing and waning. He also endorses some positional exacerbation to his pain. He denies any nausea, vomiting, diarrhea, changes in bowel habits, testicular pain or swelling, difficulty/changes in urination at the time or since then.    The pt also notes that  In the week of these symptoms he felt very cold, which is different for him as he describes himself as very warm natured. He also notes that his appetite weakened and he lost about 10 pounds, and was abiding by the [employer]. He notes that his appetite has returned and he is eating normally again. He denies any pain associated with meals. He denies recent exotic or international travel  He takes Jardiance for his DM. He notes that his PSA spiked two years ago, had a biopsy, and his PSA counts have decreased recently and is believed to be BPH.    He also notes that he had a recurring rash on his calves, worse on the left, that was red and flat, mimicking his sock impression, but extending further up his leg. He notes that this happened over  3 months, and that it came and went without any discernable pattern. He denies any associated itching.   He also notes a place on the front of his left lower leg that has been slightly swollen for the past a week. He denies remembering running into anything, bruising, or pain.   Of note prior to the patient's visit today, pt has had CT A/P completed on 10/14/17 with results revealing Retroperitoneal lymphadenopathy, highly suspicious for lymphoma or metastatic disease. 2. Prostate enlargement 3.  Aortic Atherosclerosis.   Most recent lab results (10/17/17) of CBC w/diff is as follows: all values are WNL except for WBC at 13.9k, RBC at 5.83, ANC at 10.0k, Monocytes abs at 1.4k.  On review of systems, pt reports recent back pain, feeling cold recently, recent abdominal pain, and denies nausea, vomiting, diarrhea, changes in bowel habits, difficulty/changes in urination, blood in the urine, abdominal trauma, constipation, fevers, night sweats, chills, testicular pain or swelling, noticing any lumps or bumps, unexpected weight loss, and any other symptoms.   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  On Social Hx the pt reports working in [employer] and denies chemical/radiaiton exposure. He denies ever smoking and other drug use.  Interval History:   Neil Berg returns today for management and evaluation of his newly diagnosed Stage  II extra-gonadal retroperitoneal Seminoma. The patient's last visit with [place] was on 01/02/18. The pt reports that he is doing well overall.   The pt reports that his port site has become slightly red and a little puffy over the last couple days, which is improving. He also notes that he has continued to not have any back pain. He denies leg swelling and notes that his blood sugars have been well controlled.  The pt notes that he did not need to  use Augmentin in the interim and began improving from our previous visit.   He notes that his bone pains from the Neulasta shot lasted one day and was controlled with Claritin, Ibuprofen, and Tylenol.   Lab results today (01/12/18) of CBC w/diff is as follows: all values are WNL except for WBC at 12.6k, ANC at 9.0k, Monocytes abs at 1.0k. 01/12/18 CMP, Magnesium, Phosphorous are pending.   On review of systems, pt reports improved energy levels, improved voice, mild skin rash over port site, and denies back pain, problems passing urine, fevers, chills, night sweats, mouth sores, concerns for infections, and any other symptoms.   MEDICAL HISTORY:  Past Medical History:  Diagnosis Date  . Chest pain   . Diabetes mellitus without complication ([place])   . Hypertension   . Obesity   Type II Diabetes Mellitus   SURGICAL HISTORY: Past Surgical History:  Procedure Laterality Date  . EYE SURGERY     left eye cataract removal / implant   . IR IMAGING GUIDED PORT INSERTION  12/11/2017  . TONSILLECTOMY      Tonsillectomy 1974 Cataract surgery-left eye 04/2015 Colonoscopy 06/04/16  SOCIAL HISTORY: Social History   Socioeconomic History  . Marital status: Married    Spouse name: Not on file  . Number of children: Not on file  . Years of education: Not on file  . Highest education level: Not on file  Occupational History  . Not on file  Social Needs  . Financial resource strain: Not on file  . Food insecurity:    Worry: Not on file    Inability: Not on file  . Transportation needs:    Medical: Not on file    Non-medical: Not on file  Tobacco Use  . Smoking status: Never Smoker  . Smokeless tobacco: Never Used  Substance and Sexual Activity  . Alcohol use: No  . Drug use: No  . Sexual activity: Not on file  Lifestyle  . Physical activity:    Days per week: Not on file    Minutes per session: Not on file  . Stress: Not on file  Relationships  . Social connections:    Talks on  phone: Not on file    Gets together: Not on file    Attends religious service: Not on file    Active member of club or organization: Not on file    Attends meetings of clubs or organizations: Not on file    Relationship status: Not on file  . Intimate partner violence:    Fear of current or ex partner: Not on file    Emotionally abused: Not on file    Physically abused: Not on file    Forced sexual activity: Not on file  Other Topics Concern  . Not on file  Social History Narrative  . Not on file    FAMILY HISTORY: Family History  Problem Relation Age of Onset  . Heart disease Maternal Grandmother   . Heart attack Maternal Grandfather  ALLERGIES:  has No Known Allergies.  MEDICATIONS:  Current Outpatient Medications  Medication Sig Dispense Refill  . amLODipine (NORVASC) 5 MG tablet Take 1 tablet (5 mg total) by mouth daily. 30 tablet 11  . amoxicillin-clavulanate (AUGMENTIN) 875-125 MG tablet Take 1 tablet by mouth 2 (two) times daily. 20 tablet 0  . aspirin EC 81 MG tablet Take 81 mg by mouth daily.    Marland Kitchen b complex vitamins tablet Take 1 tablet by mouth daily.    Marland Kitchen dexamethasone (DECADRON) 4 MG tablet Take 2 tablets by mouth once a day on the day starting on day 6 of chemotherapy and then take 2 tablets two times a day for 2 days. 30 tablet 1  . HYDROcodone-acetaminophen (NORCO/VICODIN) 5-325 MG tablet Take 1 tablet by mouth every 6 (six) hours as needed for moderate pain or severe pain. 30 tablet 0  . JARDIANCE 25 MG TABS tablet TK 1 T PO QAM  0  . lidocaine-prilocaine (EMLA) cream Apply to affected area once 30 g 3  . lisinopril (PRINIVIL,ZESTRIL) 20 MG tablet Take 1 tablet (20 mg total) by mouth daily. (Instead of lisinopril/HCTZ combination) 30 tablet 1  . LORazepam (ATIVAN) 0.5 MG tablet Take 1 tablet (0.5 mg total) by mouth every 6 (six) hours as needed (Nausea or vomiting). 30 tablet 0  . metoCLOPramide (REGLAN) 10 MG tablet Take 1 tablet (10 mg total) by mouth every  8 (eight) hours as needed (nausea and hiccups). 20 tablet 0  . ondansetron (ZOFRAN) 8 MG tablet Take 1 tablet (8 mg total) by mouth 2 (two) times daily as needed. Start on day 8 of chemotherapy. 30 tablet 1  . pantoprazole (PROTONIX) 40 MG tablet Take 1 tablet (40 mg total) by mouth daily. 30 tablet 0  . prochlorperazine (COMPAZINE) 10 MG tablet Take 1 tablet (10 mg total) by mouth every 6 (six) hours as needed (Nausea or vomiting). 30 tablet 1   No current facility-administered medications for this visit.     REVIEW OF SYSTEMS:    A 10+ POINT REVIEW OF SYSTEMS WAS OBTAINED including neurology, dermatology, psychiatry, cardiac, respiratory, lymph, extremities, GI, GU, Musculoskeletal, constitutional, breasts, reproductive, HEENT.  All pertinent positives are noted in the HPI.  All others are negative.   PHYSICAL EXAMINATION: ECOG PERFORMANCE STATUS: 1 - Symptomatic but completely ambulatory  Vitals:   01/12/18 0833  BP: 136/81  Pulse: 100  Resp: 18  Temp: 98.3 F (36.8 C)  SpO2: 98%   Filed Weights   01/12/18 0833  Weight: 285 lb 6.4 oz (129.5 kg)   .Body mass index is 38.71 kg/m.   GENERAL:alert, in no acute distress and comfortable SKIN: no acute rashes, no significant lesions EYES: conjunctiva are pink and non-injected, sclera anicteric OROPHARYNX: MMM, no exudates, no oropharyngeal erythema or ulceration NECK: supple, no JVD LYMPH:  no palpable lymphadenopathy in the cervical, axillary or inguinal regions LUNGS: clear to auscultation b/l with normal respiratory effort HEART: regular rate & rhythm ABDOMEN:  normoactive bowel sounds , non tender, not distended. No palpable hepatosplenomegaly.  TESTES: no obvious palpable testicular mass Extremity: no pedal edema PSYCH: alert & oriented x 3 with fluent speech NEURO: no focal motor/sensory deficits   LABORATORY DATA:  I have reviewed the data as listed  . CBC Latest Ref Rng & Units 01/12/2018 01/02/2018 12/22/2017  WBC  4.0 - 10.3 K/uL 12.6(H) 1.8(L) 9.0  Hemoglobin 13.0 - 17.1 g/dL 14.7 14.6 15.1  Hematocrit 38.4 - 49.9 % 43.7 43.9 44.9  Platelets 140 - 400 K/uL 180 107(L) 283  ANC 300  . CMP Latest Ref Rng & Units 01/02/2018 12/24/2017 12/22/2017  Glucose 70 - 99 mg/dL 171(H) 168(H) 158(H)  BUN 6 - 20 mg/dL 18 25(H) 18  Creatinine 0.61 - 1.24 mg/dL 0.68 0.72 0.76  Sodium 135 - 145 mmol/L 138 137 137  Potassium 3.5 - 5.1 mmol/L 4.2 4.4 4.2  Chloride 98 - 111 mmol/L 103 102 99  CO2 22 - 32 mmol/L 25 26 28   Calcium 8.9 - 10.3 mg/dL 8.7(L) 9.0 9.5  Total Protein 6.5 - 8.1 g/dL 6.3(L) - 7.7  Total Bilirubin 0.3 - 1.2 mg/dL 0.8 - 0.7  Alkaline Phos 38 - 126 U/L 106 - 111  AST 15 - 41 U/L 6(L) - 11(L)  ALT 0 - 44 U/L 13 - 14    Pathology:   11/25/17 Tissue Flow Cytometry:    RADIOGRAPHIC STUDIES: I have personally reviewed the radiological images as listed and agreed with the findings in the report. No results found.  ASSESSMENT & PLAN:   57 y.o. male with  1. 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 Prostate enlargement.  11/17/17 PET/CT revealed Isolated hypermetabolic retroperitoneal lymphadenopathy. Findings suspicious for lymphoma as no primary lesion is identified to suggest this is metastatic adenopathy. Recommend retroperitoneal biopsy. The largest left-sided node appears necrotic and is actually decreased in size since the prior CT scan. It appeared inflamed on the prior study and may have infarcted. I would not recommend biopsying this lesion. No adenopathy in the neck, axilla, chest, pelvis or inguinal regions. No worrisome pulmonary lesions or osseous lesions.  11/25/17 pathology results revealed  finding of metastatic seminoma    12/04/17 US Scrotum w/doppler  revealed Negative scrotal ultrasound with no distinct testicular mass Identified. 2. 5 mm simple left epididymal cyst, almost certainly benign and felt to be incidental in nature.   2. Chemotherapy related neutropenia -- resolved. PLAN:  -Advised that the pt avoid crowds and abide by strict infection prevention precautions as he is neutropenic -Discussed pt labwork today, 01/12/18; ANC at 9.0k (neutopenia resolved), no anemia, no thrombocytopenia -Triple antibiotic ointment BID for one week for mild skin rash at port site -The pt has no prohibitive toxicities from continuing C2 of EP at this time with neulasta support. -Continue with salt and baking soda mouthwashes for mouth soreness -Asked that pt call me if he develops any new concerns    -Please schedule C3 of chemotherapy and neulasta in 3 weeks as per orders. Labs and MD visit on C3D1 -may discontinue labs and appointment on 01/23/2018   All of the patients questions were answered with apparent satisfaction. The patient knows to call the clinic with any problems, questions or concerns.  The total time spent in the appt was 25 minutes and more than 50% was on counseling and direct patient cares.    Sullivan Lone MD MS AAHIVMS [facility] Hematology/Oncology Physician [facility]  (Office):       [phone] (Work cell):  [phone] (Fax):           [phone]  01/12/2018 9:11 AM  I, Baldwin Jamaica, am acting as a scribe for Dr. Irene Limbo  .I have reviewed the above documentation for accuracy and completeness, and I agree with the above. Brunetta Genera MD

## 2018-01-12 ENCOUNTER — Inpatient Hospital Stay (HOSPITAL_BASED_OUTPATIENT_CLINIC_OR_DEPARTMENT_OTHER): Payer: BLUE CROSS/BLUE SHIELD | Admitting: Hematology

## 2018-01-12 ENCOUNTER — Encounter: Payer: Self-pay | Admitting: Hematology

## 2018-01-12 ENCOUNTER — Inpatient Hospital Stay: Payer: BLUE CROSS/BLUE SHIELD

## 2018-01-12 VITALS — BP 136/81 | HR 100 | Temp 98.3°F | Resp 18 | Ht 72.0 in | Wt 285.4 lb

## 2018-01-12 DIAGNOSIS — C6290 Malignant neoplasm of unspecified testis, unspecified whether descended or undescended: Secondary | ICD-10-CM

## 2018-01-12 DIAGNOSIS — E669 Obesity, unspecified: Secondary | ICD-10-CM | POA: Diagnosis not present

## 2018-01-12 DIAGNOSIS — Z5111 Encounter for antineoplastic chemotherapy: Secondary | ICD-10-CM

## 2018-01-12 DIAGNOSIS — T451X5A Adverse effect of antineoplastic and immunosuppressive drugs, initial encounter: Secondary | ICD-10-CM

## 2018-01-12 DIAGNOSIS — D701 Agranulocytosis secondary to cancer chemotherapy: Secondary | ICD-10-CM

## 2018-01-12 DIAGNOSIS — Z7982 Long term (current) use of aspirin: Secondary | ICD-10-CM | POA: Diagnosis not present

## 2018-01-12 DIAGNOSIS — C786 Secondary malignant neoplasm of retroperitoneum and peritoneum: Secondary | ICD-10-CM | POA: Diagnosis not present

## 2018-01-12 DIAGNOSIS — Z7189 Other specified counseling: Secondary | ICD-10-CM | POA: Insufficient documentation

## 2018-01-12 DIAGNOSIS — Z79899 Other long term (current) drug therapy: Secondary | ICD-10-CM

## 2018-01-12 DIAGNOSIS — N400 Enlarged prostate without lower urinary tract symptoms: Secondary | ICD-10-CM | POA: Diagnosis not present

## 2018-01-12 DIAGNOSIS — I10 Essential (primary) hypertension: Secondary | ICD-10-CM | POA: Diagnosis not present

## 2018-01-12 DIAGNOSIS — E119 Type 2 diabetes mellitus without complications: Secondary | ICD-10-CM | POA: Diagnosis not present

## 2018-01-12 LAB — CBC WITH DIFFERENTIAL/PLATELET
BASOS ABS: 0.1 10*3/uL (ref 0.0–0.1)
BASOS PCT: 1 %
EOS ABS: 0 10*3/uL (ref 0.0–0.5)
Eosinophils Relative: 0 %
HCT: 43.7 % (ref 38.4–49.9)
HEMOGLOBIN: 14.7 g/dL (ref 13.0–17.1)
Lymphocytes Relative: 20 %
Lymphs Abs: 2.6 10*3/uL (ref 0.9–3.3)
MCH: 28.2 pg (ref 27.2–33.4)
MCHC: 33.7 g/dL (ref 32.0–36.0)
MCV: 83.6 fL (ref 79.3–98.0)
MONOS PCT: 8 %
Monocytes Absolute: 1 10*3/uL — ABNORMAL HIGH (ref 0.1–0.9)
NEUTROS ABS: 9 10*3/uL — AB (ref 1.5–6.5)
NEUTROS PCT: 71 %
Platelets: 180 10*3/uL (ref 140–400)
RBC: 5.23 MIL/uL (ref 4.20–5.82)
RDW: 13.9 % (ref 11.0–14.6)
WBC: 12.6 10*3/uL — AB (ref 4.0–10.3)

## 2018-01-12 LAB — PHOSPHORUS: Phosphorus: 4.1 mg/dL (ref 2.5–4.6)

## 2018-01-12 LAB — CMP (CANCER CENTER ONLY)
ALK PHOS: 153 U/L — AB (ref 38–126)
ALT: 31 U/L (ref 0–44)
ANION GAP: 11 (ref 5–15)
AST: 13 U/L — ABNORMAL LOW (ref 15–41)
Albumin: 3.7 g/dL (ref 3.5–5.0)
BUN: 16 mg/dL (ref 6–20)
CALCIUM: 9.2 mg/dL (ref 8.9–10.3)
CO2: 26 mmol/L (ref 22–32)
CREATININE: 0.76 mg/dL (ref 0.61–1.24)
Chloride: 101 mmol/L (ref 98–111)
Glucose, Bld: 225 mg/dL — ABNORMAL HIGH (ref 70–99)
Potassium: 4 mmol/L (ref 3.5–5.1)
Sodium: 138 mmol/L (ref 135–145)
TOTAL PROTEIN: 7 g/dL (ref 6.5–8.1)
Total Bilirubin: 0.4 mg/dL (ref 0.3–1.2)

## 2018-01-12 LAB — MAGNESIUM: Magnesium: 1.9 mg/dL (ref 1.7–2.4)

## 2018-01-12 MED ORDER — SODIUM CHLORIDE 0.9 % IV SOLN
100.0000 mg/m2 | Freq: Once | INTRAVENOUS | Status: AC
  Administered 2018-01-12: 260 mg via INTRAVENOUS
  Filled 2018-01-12: qty 13

## 2018-01-12 MED ORDER — SODIUM CHLORIDE 0.9 % IV SOLN
20.0000 mg/m2 | Freq: Once | INTRAVENOUS | Status: AC
  Administered 2018-01-12: 52 mg via INTRAVENOUS
  Filled 2018-01-12: qty 52

## 2018-01-12 MED ORDER — POTASSIUM CHLORIDE 2 MEQ/ML IV SOLN
Freq: Once | INTRAVENOUS | Status: AC
  Administered 2018-01-12: 10:00:00 via INTRAVENOUS
  Filled 2018-01-12: qty 10

## 2018-01-12 MED ORDER — HEPARIN SOD (PORK) LOCK FLUSH 100 UNIT/ML IV SOLN
500.0000 [IU] | Freq: Once | INTRAVENOUS | Status: AC | PRN
  Administered 2018-01-12: 500 [IU]
  Filled 2018-01-12: qty 5

## 2018-01-12 MED ORDER — SODIUM CHLORIDE 0.9 % IV SOLN
Freq: Once | INTRAVENOUS | Status: AC
  Administered 2018-01-12: 10:00:00 via INTRAVENOUS
  Filled 2018-01-12: qty 250

## 2018-01-12 MED ORDER — PALONOSETRON HCL INJECTION 0.25 MG/5ML
INTRAVENOUS | Status: AC
  Filled 2018-01-12: qty 5

## 2018-01-12 MED ORDER — SODIUM CHLORIDE 0.9% FLUSH
10.0000 mL | INTRAVENOUS | Status: DC | PRN
  Administered 2018-01-12: 10 mL
  Filled 2018-01-12: qty 10

## 2018-01-12 MED ORDER — SODIUM CHLORIDE 0.9 % IV SOLN
Freq: Once | INTRAVENOUS | Status: AC
  Administered 2018-01-12: 12:00:00 via INTRAVENOUS
  Filled 2018-01-12: qty 5

## 2018-01-12 MED ORDER — PALONOSETRON HCL INJECTION 0.25 MG/5ML
0.2500 mg | Freq: Once | INTRAVENOUS | Status: AC
  Administered 2018-01-12: 0.25 mg via INTRAVENOUS

## 2018-01-12 NOTE — Patient Instructions (Signed)
[facility] Discharge Instructions for Patients Receiving Chemotherapy  Today you received the following chemotherapy agents: etoposide and cisplatin.  To help prevent nausea and vomiting after your treatment, we encourage you to take your nausea medication as directed.   If you develop nausea and vomiting that is not controlled by your nausea medication, call the clinic.   BELOW ARE SYMPTOMS THAT SHOULD BE REPORTED IMMEDIATELY:  *FEVER GREATER THAN 100.5 F  *CHILLS WITH OR WITHOUT FEVER  NAUSEA AND VOMITING THAT IS NOT CONTROLLED WITH YOUR NAUSEA MEDICATION  *UNUSUAL SHORTNESS OF BREATH  *UNUSUAL BRUISING OR BLEEDING  TENDERNESS IN MOUTH AND THROAT WITH OR WITHOUT PRESENCE OF ULCERS  *URINARY PROBLEMS  *BOWEL PROBLEMS  UNUSUAL RASH Items with * indicate a potential emergency and should be followed up as soon as possible.  Feel free to call the clinic should you have any questions or concerns. The clinic phone number is [phone].  Please show the CHEMO ALERT CARD at check-in to the Emergency Department and triage nurse.   

## 2018-01-13 ENCOUNTER — Telehealth: Payer: Self-pay

## 2018-01-13 ENCOUNTER — Inpatient Hospital Stay: Payer: BLUE CROSS/BLUE SHIELD | Attending: Hematology

## 2018-01-13 VITALS — BP 157/85 | HR 100 | Temp 99.3°F | Resp 14

## 2018-01-13 DIAGNOSIS — Z79899 Other long term (current) drug therapy: Secondary | ICD-10-CM | POA: Diagnosis not present

## 2018-01-13 DIAGNOSIS — E669 Obesity, unspecified: Secondary | ICD-10-CM | POA: Diagnosis not present

## 2018-01-13 DIAGNOSIS — N400 Enlarged prostate without lower urinary tract symptoms: Secondary | ICD-10-CM | POA: Insufficient documentation

## 2018-01-13 DIAGNOSIS — Z5111 Encounter for antineoplastic chemotherapy: Secondary | ICD-10-CM | POA: Diagnosis not present

## 2018-01-13 DIAGNOSIS — E119 Type 2 diabetes mellitus without complications: Secondary | ICD-10-CM | POA: Diagnosis not present

## 2018-01-13 DIAGNOSIS — Z7982 Long term (current) use of aspirin: Secondary | ICD-10-CM | POA: Diagnosis not present

## 2018-01-13 DIAGNOSIS — Z7689 Persons encountering health services in other specified circumstances: Secondary | ICD-10-CM | POA: Diagnosis not present

## 2018-01-13 DIAGNOSIS — I10 Essential (primary) hypertension: Secondary | ICD-10-CM | POA: Insufficient documentation

## 2018-01-13 DIAGNOSIS — Z7189 Other specified counseling: Secondary | ICD-10-CM

## 2018-01-13 DIAGNOSIS — C6290 Malignant neoplasm of unspecified testis, unspecified whether descended or undescended: Secondary | ICD-10-CM

## 2018-01-13 MED ORDER — SODIUM CHLORIDE 0.9 % IV SOLN
20.0000 mg/m2 | Freq: Once | INTRAVENOUS | Status: AC
  Administered 2018-01-13: 52 mg via INTRAVENOUS
  Filled 2018-01-13: qty 52

## 2018-01-13 MED ORDER — DEXAMETHASONE SODIUM PHOSPHATE 10 MG/ML IJ SOLN
10.0000 mg | Freq: Once | INTRAMUSCULAR | Status: AC
  Administered 2018-01-13: 10 mg via INTRAVENOUS

## 2018-01-13 MED ORDER — SODIUM CHLORIDE 0.9% FLUSH
10.0000 mL | INTRAVENOUS | Status: DC | PRN
  Administered 2018-01-13: 10 mL
  Filled 2018-01-13: qty 10

## 2018-01-13 MED ORDER — SODIUM CHLORIDE 0.9 % IV SOLN
100.0000 mg/m2 | Freq: Once | INTRAVENOUS | Status: AC
  Administered 2018-01-13: 260 mg via INTRAVENOUS
  Filled 2018-01-13: qty 13

## 2018-01-13 MED ORDER — DEXAMETHASONE SODIUM PHOSPHATE 10 MG/ML IJ SOLN
INTRAMUSCULAR | Status: AC
  Filled 2018-01-13: qty 1

## 2018-01-13 MED ORDER — SODIUM CHLORIDE 0.9 % IV SOLN
Freq: Once | INTRAVENOUS | Status: AC
  Administered 2018-01-13: 09:00:00 via INTRAVENOUS
  Filled 2018-01-13: qty 250

## 2018-01-13 MED ORDER — POTASSIUM CHLORIDE 2 MEQ/ML IV SOLN
Freq: Once | INTRAVENOUS | Status: AC
  Administered 2018-01-13: 09:00:00 via INTRAVENOUS
  Filled 2018-01-13: qty 10

## 2018-01-13 MED ORDER — HEPARIN SOD (PORK) LOCK FLUSH 100 UNIT/ML IV SOLN
500.0000 [IU] | Freq: Once | INTRAVENOUS | Status: AC | PRN
  Administered 2018-01-13: 500 [IU]
  Filled 2018-01-13: qty 5

## 2018-01-13 NOTE — Patient Instructions (Signed)
[facility] Discharge Instructions for Patients Receiving Chemotherapy  Today you received the following chemotherapy agents: etoposide and cisplatin.  To help prevent nausea and vomiting after your treatment, we encourage you to take your nausea medication as directed.   If you develop nausea and vomiting that is not controlled by your nausea medication, call the clinic.   BELOW ARE SYMPTOMS THAT SHOULD BE REPORTED IMMEDIATELY:  *FEVER GREATER THAN 100.5 F  *CHILLS WITH OR WITHOUT FEVER  NAUSEA AND VOMITING THAT IS NOT CONTROLLED WITH YOUR NAUSEA MEDICATION  *UNUSUAL SHORTNESS OF BREATH  *UNUSUAL BRUISING OR BLEEDING  TENDERNESS IN MOUTH AND THROAT WITH OR WITHOUT PRESENCE OF ULCERS  *URINARY PROBLEMS  *BOWEL PROBLEMS  UNUSUAL RASH Items with * indicate a potential emergency and should be followed up as soon as possible.  Feel free to call the clinic should you have any questions or concerns. The clinic phone number is [phone].  Please show the CHEMO ALERT CARD at check-in to the Emergency Department and triage nurse.   

## 2018-01-13 NOTE — Telephone Encounter (Signed)
Will give new calender to patient in infusion. Per 9/30 los follow up on (2 infusions in the book for the 21st and the 25th)

## 2018-01-14 ENCOUNTER — Inpatient Hospital Stay: Payer: BLUE CROSS/BLUE SHIELD

## 2018-01-14 VITALS — BP 158/75 | HR 81 | Temp 98.7°F | Resp 16

## 2018-01-14 DIAGNOSIS — C6290 Malignant neoplasm of unspecified testis, unspecified whether descended or undescended: Secondary | ICD-10-CM

## 2018-01-14 DIAGNOSIS — Z7189 Other specified counseling: Secondary | ICD-10-CM

## 2018-01-14 DIAGNOSIS — Z5111 Encounter for antineoplastic chemotherapy: Secondary | ICD-10-CM | POA: Diagnosis not present

## 2018-01-14 DIAGNOSIS — E669 Obesity, unspecified: Secondary | ICD-10-CM | POA: Diagnosis not present

## 2018-01-14 DIAGNOSIS — Z7689 Persons encountering health services in other specified circumstances: Secondary | ICD-10-CM | POA: Diagnosis not present

## 2018-01-14 DIAGNOSIS — Z7982 Long term (current) use of aspirin: Secondary | ICD-10-CM | POA: Diagnosis not present

## 2018-01-14 MED ORDER — SODIUM CHLORIDE 0.9 % IV SOLN
100.0000 mg/m2 | Freq: Once | INTRAVENOUS | Status: AC
  Administered 2018-01-14: 260 mg via INTRAVENOUS
  Filled 2018-01-14: qty 13

## 2018-01-14 MED ORDER — SODIUM CHLORIDE 0.9% FLUSH
10.0000 mL | INTRAVENOUS | Status: DC | PRN
  Administered 2018-01-14: 10 mL
  Filled 2018-01-14: qty 10

## 2018-01-14 MED ORDER — HEPARIN SOD (PORK) LOCK FLUSH 100 UNIT/ML IV SOLN
500.0000 [IU] | Freq: Once | INTRAVENOUS | Status: AC | PRN
  Administered 2018-01-14: 500 [IU]
  Filled 2018-01-14: qty 5

## 2018-01-14 MED ORDER — SODIUM CHLORIDE 0.9 % IV SOLN
Freq: Once | INTRAVENOUS | Status: AC
  Administered 2018-01-14: 09:00:00 via INTRAVENOUS
  Filled 2018-01-14: qty 250

## 2018-01-14 MED ORDER — SODIUM CHLORIDE 0.9 % IV SOLN
20.0000 mg/m2 | Freq: Once | INTRAVENOUS | Status: AC
  Administered 2018-01-14: 52 mg via INTRAVENOUS
  Filled 2018-01-14: qty 52

## 2018-01-14 MED ORDER — SODIUM CHLORIDE 0.9 % IV SOLN
Freq: Once | INTRAVENOUS | Status: AC
  Administered 2018-01-14: 12:00:00 via INTRAVENOUS
  Filled 2018-01-14: qty 5

## 2018-01-14 MED ORDER — PALONOSETRON HCL INJECTION 0.25 MG/5ML
INTRAVENOUS | Status: AC
  Filled 2018-01-14: qty 5

## 2018-01-14 MED ORDER — DEXAMETHASONE SODIUM PHOSPHATE 10 MG/ML IJ SOLN
INTRAMUSCULAR | Status: AC
  Filled 2018-01-14: qty 1

## 2018-01-14 MED ORDER — PALONOSETRON HCL INJECTION 0.25 MG/5ML
0.2500 mg | Freq: Once | INTRAVENOUS | Status: AC
  Administered 2018-01-14: 0.25 mg via INTRAVENOUS

## 2018-01-14 MED ORDER — POTASSIUM CHLORIDE 2 MEQ/ML IV SOLN
Freq: Once | INTRAVENOUS | Status: AC
  Administered 2018-01-14: 10:00:00 via INTRAVENOUS
  Filled 2018-01-14: qty 10

## 2018-01-14 NOTE — Patient Instructions (Signed)
[facility] Discharge Instructions for Patients Receiving Chemotherapy  Today you received the following chemotherapy agents: etoposide and cisplatin.  To help prevent nausea and vomiting after your treatment, we encourage you to take your nausea medication as directed.   If you develop nausea and vomiting that is not controlled by your nausea medication, call the clinic.   BELOW ARE SYMPTOMS THAT SHOULD BE REPORTED IMMEDIATELY:  *FEVER GREATER THAN 100.5 F  *CHILLS WITH OR WITHOUT FEVER  NAUSEA AND VOMITING THAT IS NOT CONTROLLED WITH YOUR NAUSEA MEDICATION  *UNUSUAL SHORTNESS OF BREATH  *UNUSUAL BRUISING OR BLEEDING  TENDERNESS IN MOUTH AND THROAT WITH OR WITHOUT PRESENCE OF ULCERS  *URINARY PROBLEMS  *BOWEL PROBLEMS  UNUSUAL RASH Items with * indicate a potential emergency and should be followed up as soon as possible.  Feel free to call the clinic should you have any questions or concerns. The clinic phone number is [phone].  Please show the CHEMO ALERT CARD at check-in to the Emergency Department and triage nurse.   

## 2018-01-15 ENCOUNTER — Inpatient Hospital Stay: Payer: BLUE CROSS/BLUE SHIELD

## 2018-01-15 VITALS — BP 153/84 | HR 94 | Temp 98.3°F | Resp 17

## 2018-01-15 DIAGNOSIS — C6290 Malignant neoplasm of unspecified testis, unspecified whether descended or undescended: Secondary | ICD-10-CM | POA: Diagnosis not present

## 2018-01-15 DIAGNOSIS — E669 Obesity, unspecified: Secondary | ICD-10-CM | POA: Diagnosis not present

## 2018-01-15 DIAGNOSIS — Z7982 Long term (current) use of aspirin: Secondary | ICD-10-CM | POA: Diagnosis not present

## 2018-01-15 DIAGNOSIS — Z7189 Other specified counseling: Secondary | ICD-10-CM

## 2018-01-15 DIAGNOSIS — Z5111 Encounter for antineoplastic chemotherapy: Secondary | ICD-10-CM | POA: Diagnosis not present

## 2018-01-15 DIAGNOSIS — Z7689 Persons encountering health services in other specified circumstances: Secondary | ICD-10-CM | POA: Diagnosis not present

## 2018-01-15 MED ORDER — POTASSIUM CHLORIDE 2 MEQ/ML IV SOLN
Freq: Once | INTRAVENOUS | Status: AC
  Administered 2018-01-15: 09:00:00 via INTRAVENOUS
  Filled 2018-01-15: qty 10

## 2018-01-15 MED ORDER — SODIUM CHLORIDE 0.9% FLUSH
10.0000 mL | INTRAVENOUS | Status: DC | PRN
  Administered 2018-01-15: 10 mL
  Filled 2018-01-15: qty 10

## 2018-01-15 MED ORDER — HEPARIN SOD (PORK) LOCK FLUSH 100 UNIT/ML IV SOLN
500.0000 [IU] | Freq: Once | INTRAVENOUS | Status: AC | PRN
  Administered 2018-01-15: 500 [IU]
  Filled 2018-01-15: qty 5

## 2018-01-15 MED ORDER — PALONOSETRON HCL INJECTION 0.25 MG/5ML
INTRAVENOUS | Status: AC
  Filled 2018-01-15: qty 5

## 2018-01-15 MED ORDER — SODIUM CHLORIDE 0.9 % IV SOLN
Freq: Once | INTRAVENOUS | Status: AC
  Administered 2018-01-15: 08:00:00 via INTRAVENOUS
  Filled 2018-01-15: qty 250

## 2018-01-15 MED ORDER — SODIUM CHLORIDE 0.9 % IV SOLN
100.0000 mg/m2 | Freq: Once | INTRAVENOUS | Status: AC
  Administered 2018-01-15: 260 mg via INTRAVENOUS
  Filled 2018-01-15: qty 13

## 2018-01-15 MED ORDER — DEXAMETHASONE SODIUM PHOSPHATE 10 MG/ML IJ SOLN
INTRAMUSCULAR | Status: AC
  Filled 2018-01-15: qty 1

## 2018-01-15 MED ORDER — DEXAMETHASONE SODIUM PHOSPHATE 10 MG/ML IJ SOLN
10.0000 mg | Freq: Once | INTRAMUSCULAR | Status: AC
  Administered 2018-01-15: 10 mg via INTRAVENOUS

## 2018-01-15 MED ORDER — SODIUM CHLORIDE 0.9 % IV SOLN
20.0000 mg/m2 | Freq: Once | INTRAVENOUS | Status: AC
  Administered 2018-01-15: 52 mg via INTRAVENOUS
  Filled 2018-01-15: qty 52

## 2018-01-15 NOTE — Progress Notes (Signed)
Patient called this RN over and had the filter from the etoposide line in paper towels that were slightly wet from where the filter was leaking. He did not touch the line directly but this RN had him immediately wash his hands. The infusion was stopped and the etoposide was sent back to pharmacy to be re-primed. Received the etoposide back from pharmacy, taped the filter with clear tape and restarted the etoposide.

## 2018-01-15 NOTE — Patient Instructions (Signed)
[facility] Discharge Instructions for Patients Receiving Chemotherapy  Today you received the following chemotherapy agents Etoposide, Cisplatin.  To help prevent nausea and vomiting after your treatment, we encourage you to take your nausea medication as directed.   If you develop nausea and vomiting that is not controlled by your nausea medication, call the clinic.   BELOW ARE SYMPTOMS THAT SHOULD BE REPORTED IMMEDIATELY:  *FEVER GREATER THAN 100.5 F  *CHILLS WITH OR WITHOUT FEVER  NAUSEA AND VOMITING THAT IS NOT CONTROLLED WITH YOUR NAUSEA MEDICATION  *UNUSUAL SHORTNESS OF BREATH  *UNUSUAL BRUISING OR BLEEDING  TENDERNESS IN MOUTH AND THROAT WITH OR WITHOUT PRESENCE OF ULCERS  *URINARY PROBLEMS  *BOWEL PROBLEMS  UNUSUAL RASH Items with * indicate a potential emergency and should be followed up as soon as possible.  Feel free to call the clinic should you have any questions or concerns. The clinic phone number is [phone].  Please show the CHEMO ALERT CARD at check-in to the Emergency Department and triage nurse.   

## 2018-01-16 ENCOUNTER — Inpatient Hospital Stay: Payer: BLUE CROSS/BLUE SHIELD

## 2018-01-16 VITALS — BP 136/70 | HR 83 | Temp 98.8°F | Resp 16

## 2018-01-16 DIAGNOSIS — Z7982 Long term (current) use of aspirin: Secondary | ICD-10-CM | POA: Diagnosis not present

## 2018-01-16 DIAGNOSIS — Z7189 Other specified counseling: Secondary | ICD-10-CM

## 2018-01-16 DIAGNOSIS — E669 Obesity, unspecified: Secondary | ICD-10-CM | POA: Diagnosis not present

## 2018-01-16 DIAGNOSIS — C6290 Malignant neoplasm of unspecified testis, unspecified whether descended or undescended: Secondary | ICD-10-CM | POA: Diagnosis not present

## 2018-01-16 DIAGNOSIS — Z7689 Persons encountering health services in other specified circumstances: Secondary | ICD-10-CM | POA: Diagnosis not present

## 2018-01-16 DIAGNOSIS — Z5111 Encounter for antineoplastic chemotherapy: Secondary | ICD-10-CM | POA: Diagnosis not present

## 2018-01-16 MED ORDER — SODIUM CHLORIDE 0.9 % IV SOLN
100.0000 mg/m2 | Freq: Once | INTRAVENOUS | Status: AC
  Administered 2018-01-16: 260 mg via INTRAVENOUS
  Filled 2018-01-16: qty 13

## 2018-01-16 MED ORDER — SODIUM CHLORIDE 0.9 % IV SOLN
Freq: Once | INTRAVENOUS | Status: AC
  Administered 2018-01-16: 09:00:00 via INTRAVENOUS
  Filled 2018-01-16: qty 250

## 2018-01-16 MED ORDER — HEPARIN SOD (PORK) LOCK FLUSH 100 UNIT/ML IV SOLN
500.0000 [IU] | Freq: Once | INTRAVENOUS | Status: AC | PRN
  Administered 2018-01-16: 500 [IU]
  Filled 2018-01-16: qty 5

## 2018-01-16 MED ORDER — PALONOSETRON HCL INJECTION 0.25 MG/5ML
0.2500 mg | Freq: Once | INTRAVENOUS | Status: AC
  Administered 2018-01-16: 0.25 mg via INTRAVENOUS

## 2018-01-16 MED ORDER — SODIUM CHLORIDE 0.9 % IV SOLN
20.0000 mg/m2 | Freq: Once | INTRAVENOUS | Status: AC
  Administered 2018-01-16: 52 mg via INTRAVENOUS
  Filled 2018-01-16: qty 52

## 2018-01-16 MED ORDER — PALONOSETRON HCL INJECTION 0.25 MG/5ML
INTRAVENOUS | Status: AC
  Filled 2018-01-16: qty 5

## 2018-01-16 MED ORDER — SODIUM CHLORIDE 0.9 % IV SOLN
Freq: Once | INTRAVENOUS | Status: AC
  Administered 2018-01-16: 12:00:00 via INTRAVENOUS
  Filled 2018-01-16: qty 5

## 2018-01-16 MED ORDER — POTASSIUM CHLORIDE 2 MEQ/ML IV SOLN
Freq: Once | INTRAVENOUS | Status: AC
  Administered 2018-01-16: 09:00:00 via INTRAVENOUS
  Filled 2018-01-16: qty 10

## 2018-01-16 MED ORDER — SODIUM CHLORIDE 0.9% FLUSH
10.0000 mL | INTRAVENOUS | Status: DC | PRN
  Administered 2018-01-16: 10 mL
  Filled 2018-01-16: qty 10

## 2018-01-16 NOTE — Patient Instructions (Signed)
[facility] Discharge Instructions for Patients Receiving Chemotherapy  Today you received the following chemotherapy agents Etoposide, Cisplatin.  To help prevent nausea and vomiting after your treatment, we encourage you to take your nausea medication as directed.   If you develop nausea and vomiting that is not controlled by your nausea medication, call the clinic.   BELOW ARE SYMPTOMS THAT SHOULD BE REPORTED IMMEDIATELY:  *FEVER GREATER THAN 100.5 F  *CHILLS WITH OR WITHOUT FEVER  NAUSEA AND VOMITING THAT IS NOT CONTROLLED WITH YOUR NAUSEA MEDICATION  *UNUSUAL SHORTNESS OF BREATH  *UNUSUAL BRUISING OR BLEEDING  TENDERNESS IN MOUTH AND THROAT WITH OR WITHOUT PRESENCE OF ULCERS  *URINARY PROBLEMS  *BOWEL PROBLEMS  UNUSUAL RASH Items with * indicate a potential emergency and should be followed up as soon as possible.  Feel free to call the clinic should you have any questions or concerns. The clinic phone number is [phone].  Please show the CHEMO ALERT CARD at check-in to the Emergency Department and triage nurse.   

## 2018-01-16 NOTE — Progress Notes (Signed)
Okay to run post hydration fluids at 33mL/hr per Dr. Irene Limbo.

## 2018-01-19 ENCOUNTER — Inpatient Hospital Stay: Payer: BLUE CROSS/BLUE SHIELD

## 2018-01-19 VITALS — BP 114/78 | HR 90 | Temp 98.6°F | Resp 18

## 2018-01-19 DIAGNOSIS — Z7982 Long term (current) use of aspirin: Secondary | ICD-10-CM | POA: Diagnosis not present

## 2018-01-19 DIAGNOSIS — Z7689 Persons encountering health services in other specified circumstances: Secondary | ICD-10-CM | POA: Diagnosis not present

## 2018-01-19 DIAGNOSIS — C6290 Malignant neoplasm of unspecified testis, unspecified whether descended or undescended: Secondary | ICD-10-CM | POA: Diagnosis not present

## 2018-01-19 DIAGNOSIS — E669 Obesity, unspecified: Secondary | ICD-10-CM | POA: Diagnosis not present

## 2018-01-19 DIAGNOSIS — Z7189 Other specified counseling: Secondary | ICD-10-CM

## 2018-01-19 DIAGNOSIS — Z5111 Encounter for antineoplastic chemotherapy: Secondary | ICD-10-CM | POA: Diagnosis not present

## 2018-01-19 MED ORDER — PEGFILGRASTIM-CBQV 6 MG/0.6ML ~~LOC~~ SOSY
PREFILLED_SYRINGE | SUBCUTANEOUS | Status: AC
  Filled 2018-01-19: qty 0.6

## 2018-01-19 MED ORDER — PEGFILGRASTIM-CBQV 6 MG/0.6ML ~~LOC~~ SOSY
6.0000 mg | PREFILLED_SYRINGE | Freq: Once | SUBCUTANEOUS | Status: AC
  Administered 2018-01-19: 6 mg via SUBCUTANEOUS

## 2018-01-22 ENCOUNTER — Other Ambulatory Visit: Payer: Self-pay | Admitting: Hematology

## 2018-01-23 ENCOUNTER — Ambulatory Visit: Payer: BLUE CROSS/BLUE SHIELD | Admitting: Hematology

## 2018-01-23 ENCOUNTER — Other Ambulatory Visit: Payer: BLUE CROSS/BLUE SHIELD

## 2018-01-29 NOTE — Progress Notes (Signed)
HEMATOLOGY/ONCOLOGY CLNIC NOTE  Date of Service: 02/02/2018  Patient Care Team: Antony Contras, MD as PCP - General (Family Medicine)  CHIEF COMPLAINTS/PURPOSE OF CONSULTATION:  F/u for extragondal seminoma  HISTORY OF PRESENTING ILLNESS:   Neil Berg is a wonderful 57 y.o. male who has been referred to [place] by Dr. Antony Contras for evaluation and management of Lymphadenopathy. He is accompanied today by his wife. The pt reports that he is doing well overall and [place]   The pt reports that he hasn't had any pain in over a week and recently appeared to [facility] on 10/17/17. He notes that he woke up on the morning of 10/17/17 with significant, unique, back and kidney pain which began to spread around to the RUQ of his abdomen as the morning progressed. He notes that his pain went away after taking pain medications for 5 days, and describes that the pain was waxing and waning. He also endorses some positional exacerbation to his pain. He denies any nausea, vomiting, diarrhea, changes in bowel habits, testicular pain or swelling, difficulty/changes in urination at the time or since then.    The pt also notes that  In the week of these symptoms he felt very cold, which is different for him as he describes himself as very warm natured. He also notes that his appetite weakened and he lost about 10 pounds, and was abiding by the [employer]. He notes that his appetite has returned and he is eating normally again. He denies any pain associated with meals. He denies recent exotic or international travel  He takes Jardiance for his DM. He notes that his PSA spiked two years ago, had a biopsy, and his PSA counts have decreased recently and is believed to be BPH.    He also notes that he had a recurring rash on his calves, worse on the left, that was red and flat, mimicking his sock impression, but extending further up his leg. He notes that this happened  over 3 months, and that it came and went without any discernable pattern. He denies any associated itching.   He also notes a place on the front of his left lower leg that has been slightly swollen for the past a week. He denies remembering running into anything, bruising, or pain.   Of note prior to the patient's visit today, pt has had CT A/P completed on 10/14/17 with results revealing Retroperitoneal lymphadenopathy, highly suspicious for lymphoma or metastatic disease. 2. Prostate enlargement 3.  Aortic Atherosclerosis.   Most recent lab results (10/17/17) of CBC w/diff is as follows: all values are WNL except for WBC at 13.9k, RBC at 5.83, ANC at 10.0k, Monocytes abs at 1.4k.  On review of systems, pt reports recent back pain, feeling cold recently, recent abdominal pain, and denies nausea, vomiting, diarrhea, changes in bowel habits, difficulty/changes in urination, blood in the urine, abdominal trauma, constipation, fevers, night sweats, chills, testicular pain or swelling, noticing any lumps or bumps, unexpected weight loss, and any other symptoms.   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  On Social Hx the pt reports working in [employer] and denies chemical/radiaiton exposure. He denies ever smoking and other drug use.  Interval History:   Neil Berg returns today for management and evaluation of his newly diagnosed Stage  II extra-gonadal retroperitoneal Seminoma. The patient's last visit with [place] was on 01/12/18. The pt reports that he is doing well overall.   The pt reports that he has not had any concerns for infections in the interim and denies any fevers or chills. The pt notes that he has continued to not develop any tingling or numbness in his hands or feet and has not had any tinnitus or changes in hearing.   The pt notes that he has not developed  any new concerns, and is eating well and endorses stable energy levels.   The pt notes that his previous back pain has continued to resolve and continues to flare once a month, which he successfully manages with Tylenol. He notes that he does not believe this is due to Neulasta.   Lab results today (02/02/18) of CBC w/diff, CMP is as follows: all values are WNL except for WBC at 14.5k, ANC at 9.2k, Monocytes abs at 1.2k, Abs Immature granulocytes at 1.07k, Sodium at 134, CO2 at 21, Glucose at 240, Alk Phos at 137. 02/02/18 LDH is pending 02/02/18 Magnesium is WNL at 1.8  On review of systems, pt reports stable energy levels, eating well, stable weight, and denies fevers, chills, night sweats, concerns for infections, tinnitus, tingling numbness in hands or feet, changes in hearing, mouth sores, abdominal pains,leg swelling, and any other symptoms.   MEDICAL HISTORY:  Past Medical History:  Diagnosis Date  . Chest pain   . Diabetes mellitus without complication ([place])   . Hypertension   . Obesity   Type II Diabetes Mellitus   SURGICAL HISTORY: Past Surgical History:  Procedure Laterality Date  . EYE SURGERY     left eye cataract removal / implant   . IR IMAGING GUIDED PORT INSERTION  12/11/2017  . TONSILLECTOMY      Tonsillectomy 1974 Cataract surgery-left eye 04/2015 Colonoscopy 06/04/16  SOCIAL HISTORY: Social History   Socioeconomic History  . Marital status: Married    Spouse name: Not on file  . Number of children: Not on file  . Years of education: Not on file  . Highest education level: Not on file  Occupational History  . Not on file  Social Needs  . Financial resource strain: Not on file  . Food insecurity:    Worry: Not on file    Inability: Not on file  . Transportation needs:    Medical: Not on file    Non-medical: Not on file  Tobacco Use  . Smoking status: Never Smoker  . Smokeless tobacco: Never Used  Substance and Sexual Activity  . Alcohol use: No    . Drug use: No  . Sexual activity: Not on file  Lifestyle  . Physical activity:    Days per week: Not on file    Minutes per session: Not on file  . Stress: Not on file  Relationships  . Social connections:    Talks on phone: Not on file    Gets together: Not on file    Attends religious service: Not on file    Active member of club or organization: Not on file    Attends meetings of clubs or organizations: Not on file    Relationship status: Not on file  . Intimate partner violence:    Fear of current or ex partner: Not on file    Emotionally abused: Not on file    Physically abused: Not on file    Forced sexual activity: Not on file  Other Topics Concern  . Not on file  Social History Narrative  . Not on file    FAMILY HISTORY: Family History  Problem Relation Age of Onset  . Heart disease Maternal Grandmother   . Heart attack Maternal Grandfather     ALLERGIES:  has No Known Allergies.  MEDICATIONS:  Current Outpatient Medications  Medication Sig Dispense Refill  . amLODipine (NORVASC) 5 MG tablet Take 1 tablet (5 mg total) by mouth daily. 30 tablet 11  . amoxicillin-clavulanate (AUGMENTIN) 875-125 MG tablet Take 1 tablet by mouth 2 (two) times daily. 20 tablet 0  . aspirin EC 81 MG tablet Take 81 mg by mouth daily.    Marland Kitchen b complex vitamins tablet Take 1 tablet by mouth daily.    Marland Kitchen dexamethasone (DECADRON) 4 MG tablet Take 2 tablets by mouth once a day on the day starting on day 6 of chemotherapy and then take 2 tablets two times a day for 2 days. 30 tablet 1  . HYDROcodone-acetaminophen (NORCO/VICODIN) 5-325 MG tablet Take 1 tablet by mouth every 6 (six) hours as needed for moderate pain or severe pain. 30 tablet 0  . JARDIANCE 25 MG TABS tablet TK 1 T PO QAM  0  . lidocaine-prilocaine (EMLA) cream Apply to affected area once 30 g 3  . lisinopril (PRINIVIL,ZESTRIL) 20 MG tablet Take 1 tablet (20 mg total) by mouth daily. (Instead of lisinopril/HCTZ combination) 30  tablet 1  . LORazepam (ATIVAN) 0.5 MG tablet Take 1 tablet (0.5 mg total) by mouth every 6 (six) hours as needed (Nausea or vomiting). 30 tablet 0  . metoCLOPramide (REGLAN) 10 MG tablet Take 1 tablet (10 mg total) by mouth every 8 (eight) hours as needed (nausea and hiccups). 20 tablet 0  . ondansetron (ZOFRAN) 8 MG tablet Take 1 tablet (8 mg total) by mouth 2 (two) times daily as needed. Start on day 8 of chemotherapy. 30 tablet 1  . pantoprazole (PROTONIX) 40 MG tablet TAKE 1 TABLET(40 MG) BY MOUTH DAILY 30 tablet 0  . prochlorperazine (COMPAZINE) 10 MG tablet Take 1 tablet (10 mg total) by mouth every 6 (six) hours as needed (Nausea or vomiting). 30 tablet 1   No current facility-administered medications for this visit.    Facility-Administered Medications Ordered in Other Visits  Medication Dose Route Frequency Provider Last Rate Last Dose  . CISplatin (PLATINOL) 52 mg in sodium chloride 0.9 % 250 mL chemo infusion  20 mg/m2 (Treatment Plan Recorded) Intravenous Once Brunetta Genera, MD      . dextrose 5 % and 0.45% NaCl 1,000 mL with potassium chloride 20 mEq, magnesium sulfate 12 mEq infusion   Intravenous Once Brunetta Genera, MD      . etoposide (VEPESID) 260 mg in sodium chloride 0.9 % 1,000 mL chemo infusion  100 mg/m2 (Treatment Plan Recorded) Intravenous Once Brunetta Genera, MD      . fosaprepitant (EMEND) 150 mg, dexamethasone (DECADRON) 12 mg in sodium chloride 0.9 % 145 mL IVPB   Intravenous Once Brunetta Genera, MD      . heparin lock flush 100 unit/mL  500 Units Intracatheter Once PRN Brunetta Genera, MD      . palonosetron (ALOXI) injection 0.25 mg  0.25 mg Intravenous Once Brunetta Genera, MD      . sodium chloride flush (NS) 0.9 % injection 10 mL  10 mL Intracatheter PRN Brunetta Genera, MD        REVIEW OF SYSTEMS:  A 10+ POINT REVIEW OF SYSTEMS WAS OBTAINED including neurology, dermatology, psychiatry, cardiac, respiratory, lymph,  extremities, GI, GU, Musculoskeletal, constitutional, breasts, reproductive, HEENT.  All pertinent positives are noted in the HPI.  All others are negative.   PHYSICAL EXAMINATION: ECOG PERFORMANCE STATUS: 1 - Symptomatic but completely ambulatory  VS reviewed GENERAL:alert, in no acute distress and comfortable SKIN: no acute rashes, no significant lesions EYES: conjunctiva are pink and non-injected, sclera anicteric OROPHARYNX: MMM, no exudates, no oropharyngeal erythema or ulceration NECK: supple, no JVD LYMPH:  no palpable lymphadenopathy in the cervical, axillary or inguinal regions LUNGS: clear to auscultation b/l with normal respiratory effort HEART: regular rate & rhythm ABDOMEN:  normoactive bowel sounds , non tender, not distended. No palpable hepatosplenomegaly.  TESTES: no obvious palpable testicular mass Extremity: no pedal edema PSYCH: alert & oriented x 3 with fluent speech NEURO: no focal motor/sensory deficits   LABORATORY DATA:  I have reviewed the data as listed  . CBC Latest Ref Rng & Units 02/02/2018 01/12/2018 01/02/2018  WBC 4.0 - 10.5 K/uL 14.5(H) 12.6(H) 1.8(L)  Hemoglobin 13.0 - 17.0 g/dL 13.2 14.7 14.6  Hematocrit 39.0 - 52.0 % 39.7 43.7 43.9  Platelets 150 - 400 K/uL 212 180 107(L)  ANC 300  . CMP Latest Ref Rng & Units 02/02/2018 01/12/2018 01/02/2018  Glucose 70 - 99 mg/dL 240(H) 225(H) 171(H)  BUN 6 - 20 mg/dL 19 16 18   Creatinine 0.61 - 1.24 mg/dL 0.81 0.76 0.68  Sodium 135 - 145 mmol/L 134(L) 138 138  Potassium 3.5 - 5.1 mmol/L 4.4 4.0 4.2  Chloride 98 - 111 mmol/L 99 101 103  CO2 22 - 32 mmol/L 21(L) 26 25  Calcium 8.9 - 10.3 mg/dL 9.1 9.2 8.7(L)  Total Protein 6.5 - 8.1 g/dL 6.8 7.0 6.3(L)  Total Bilirubin 0.3 - 1.2 mg/dL 0.3 0.4 0.8  Alkaline Phos 38 - 126 U/L 137(H) 153(H) 106  AST 15 - 41 U/L 15 13(L) 6(L)  ALT 0 - 44 U/L 29 31 13     Pathology:   11/25/17 Tissue Flow Cytometry:    RADIOGRAPHIC STUDIES: I have personally reviewed  the radiological images as listed and agreed with the findings in the report. No results found.  ASSESSMENT & PLAN:   57 y.o. male with  1. 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 Prostate enlargement.  11/17/17 PET/CT revealed Isolated hypermetabolic retroperitoneal lymphadenopathy. Findings suspicious for lymphoma as no primary lesion is identified to suggest this is metastatic adenopathy. Recommend retroperitoneal biopsy. The largest left-sided node appears necrotic and is actually decreased in size since the prior CT scan. It appeared inflamed on the prior study and may have infarcted. I would not recommend biopsying this lesion. No adenopathy in the neck, axilla, chest, pelvis or inguinal regions. No worrisome pulmonary lesions or osseous lesions.  11/25/17 pathology results revealed  finding of metastatic seminoma    12/04/17 US Scrotum w/doppler revealed Negative scrotal ultrasound with no distinct testicular mass Identified. 2. 5 mm simple left epididymal cyst, almost certainly benign and felt to be incidental in nature.   2. Chemotherapy related neutropenia -- resolved.  PLAN:  -Advised that the pt avoid crowds and abide by strict infection prevention precautions as he is neutropenic -Triple antibiotic ointment BID for one week for mild skin rash at port site -Continue  with salt and baking soda mouthwashes for mouth soreness -Asked that pt call me if he develops any new concerns  -Discussed pt labwork today, 02/02/18; ANC at 9.2k in the setting of G-CSF support, blood counts and chemistries are otherwise stable  -The pt has no prohibitive toxicities from continuing C3 EP at this time.   -Recommended that the pt continue to eat well, drink at least 48-64 oz  of water each day, and walk 20-30 minutes each day.  -Will see the pt back in 3 weeks   Please schedule next cycle of treatment as ordered in 3 weeks with labs and MD visit   All of the patients questions were answered with apparent satisfaction. The patient knows to call the clinic with any problems, questions or concerns.  The total time spent in the appt was 25 minutes and more than 50% was on counseling and direct patient cares.    Sullivan Lone MD MS AAHIVMS [facility] Hematology/Oncology Physician [facility]  (Office):       [phone] (Work cell):  [phone] (Fax):           [phone]  02/02/2018 9:44 AM  I, Baldwin Jamaica, am acting as a scribe for Dr. Irene Limbo  .I have reviewed the above documentation for accuracy and completeness, and I agree with the above. Brunetta Genera MD

## 2018-02-02 ENCOUNTER — Inpatient Hospital Stay (HOSPITAL_BASED_OUTPATIENT_CLINIC_OR_DEPARTMENT_OTHER): Payer: BLUE CROSS/BLUE SHIELD | Admitting: Hematology

## 2018-02-02 ENCOUNTER — Encounter: Payer: Self-pay | Admitting: Hematology

## 2018-02-02 ENCOUNTER — Inpatient Hospital Stay: Payer: BLUE CROSS/BLUE SHIELD

## 2018-02-02 ENCOUNTER — Other Ambulatory Visit: Payer: Self-pay | Admitting: *Deleted

## 2018-02-02 VITALS — BP 131/83 | HR 94 | Temp 99.1°F | Resp 18

## 2018-02-02 DIAGNOSIS — Z7982 Long term (current) use of aspirin: Secondary | ICD-10-CM | POA: Diagnosis not present

## 2018-02-02 DIAGNOSIS — Z95828 Presence of other vascular implants and grafts: Secondary | ICD-10-CM

## 2018-02-02 DIAGNOSIS — C6290 Malignant neoplasm of unspecified testis, unspecified whether descended or undescended: Secondary | ICD-10-CM

## 2018-02-02 DIAGNOSIS — Z5111 Encounter for antineoplastic chemotherapy: Secondary | ICD-10-CM

## 2018-02-02 DIAGNOSIS — Z7189 Other specified counseling: Secondary | ICD-10-CM

## 2018-02-02 DIAGNOSIS — Z7689 Persons encountering health services in other specified circumstances: Secondary | ICD-10-CM | POA: Diagnosis not present

## 2018-02-02 DIAGNOSIS — E669 Obesity, unspecified: Secondary | ICD-10-CM | POA: Diagnosis not present

## 2018-02-02 DIAGNOSIS — Z79899 Other long term (current) drug therapy: Secondary | ICD-10-CM

## 2018-02-02 DIAGNOSIS — R590 Localized enlarged lymph nodes: Secondary | ICD-10-CM

## 2018-02-02 LAB — LACTATE DEHYDROGENASE: LDH: 357 U/L — AB (ref 98–192)

## 2018-02-02 LAB — CBC WITH DIFFERENTIAL (CANCER CENTER ONLY)
ABS IMMATURE GRANULOCYTES: 1.07 10*3/uL — AB (ref 0.00–0.07)
Basophils Absolute: 0.1 10*3/uL (ref 0.0–0.1)
Basophils Relative: 0 %
Eosinophils Absolute: 0 10*3/uL (ref 0.0–0.5)
Eosinophils Relative: 0 %
HCT: 39.7 % (ref 39.0–52.0)
Hemoglobin: 13.2 g/dL (ref 13.0–17.0)
IMMATURE GRANULOCYTES: 7 %
Lymphocytes Relative: 20 %
Lymphs Abs: 2.9 10*3/uL (ref 0.7–4.0)
MCH: 28.3 pg (ref 26.0–34.0)
MCHC: 33.2 g/dL (ref 30.0–36.0)
MCV: 85.2 fL (ref 80.0–100.0)
MONOS PCT: 9 %
Monocytes Absolute: 1.2 10*3/uL — ABNORMAL HIGH (ref 0.1–1.0)
NEUTROS ABS: 9.2 10*3/uL — AB (ref 1.7–7.7)
NEUTROS PCT: 64 %
PLATELETS: 212 10*3/uL (ref 150–400)
RBC: 4.66 MIL/uL (ref 4.22–5.81)
RDW: 14.4 % (ref 11.5–15.5)
WBC Count: 14.5 10*3/uL — ABNORMAL HIGH (ref 4.0–10.5)
nRBC: 0.2 % (ref 0.0–0.2)

## 2018-02-02 LAB — CMP (CANCER CENTER ONLY)
ALBUMIN: 3.7 g/dL (ref 3.5–5.0)
ALT: 29 U/L (ref 0–44)
AST: 15 U/L (ref 15–41)
Alkaline Phosphatase: 137 U/L — ABNORMAL HIGH (ref 38–126)
Anion gap: 14 (ref 5–15)
BUN: 19 mg/dL (ref 6–20)
CHLORIDE: 99 mmol/L (ref 98–111)
CO2: 21 mmol/L — AB (ref 22–32)
Calcium: 9.1 mg/dL (ref 8.9–10.3)
Creatinine: 0.81 mg/dL (ref 0.61–1.24)
GFR, Est AFR Am: >60 mL/min (ref >60)
GFR, Estimated: >60 mL/min (ref >60)
GLUCOSE: 240 mg/dL — AB (ref 70–99)
Potassium: 4.4 mmol/L (ref 3.5–5.1)
SODIUM: 134 mmol/L — AB (ref 135–145)
TOTAL PROTEIN: 6.8 g/dL (ref 6.5–8.1)
Total Bilirubin: 0.3 mg/dL (ref 0.3–1.2)

## 2018-02-02 LAB — MAGNESIUM: Magnesium: 1.8 mg/dL (ref 1.7–2.4)

## 2018-02-02 MED ORDER — SODIUM CHLORIDE 0.9 % IV SOLN
20.0000 mg/m2 | Freq: Once | INTRAVENOUS | Status: AC
  Administered 2018-02-02: 52 mg via INTRAVENOUS
  Filled 2018-02-02: qty 52

## 2018-02-02 MED ORDER — POTASSIUM CHLORIDE 2 MEQ/ML IV SOLN
Freq: Once | INTRAVENOUS | Status: AC
  Administered 2018-02-02: 10:00:00 via INTRAVENOUS
  Filled 2018-02-02: qty 10

## 2018-02-02 MED ORDER — SODIUM CHLORIDE 0.9 % IV SOLN
100.0000 mg/m2 | Freq: Once | INTRAVENOUS | Status: AC
  Administered 2018-02-02: 260 mg via INTRAVENOUS
  Filled 2018-02-02: qty 13

## 2018-02-02 MED ORDER — SODIUM CHLORIDE 0.9 % IV SOLN
Freq: Once | INTRAVENOUS | Status: AC
  Administered 2018-02-02: 09:00:00 via INTRAVENOUS
  Filled 2018-02-02: qty 250

## 2018-02-02 MED ORDER — HEPARIN SOD (PORK) LOCK FLUSH 100 UNIT/ML IV SOLN
500.0000 [IU] | Freq: Once | INTRAVENOUS | Status: AC | PRN
  Administered 2018-02-02: 500 [IU]
  Filled 2018-02-02: qty 5

## 2018-02-02 MED ORDER — PALONOSETRON HCL INJECTION 0.25 MG/5ML
0.2500 mg | Freq: Once | INTRAVENOUS | Status: AC
  Administered 2018-02-02: 0.25 mg via INTRAVENOUS

## 2018-02-02 MED ORDER — SODIUM CHLORIDE 0.9 % IV SOLN
Freq: Once | INTRAVENOUS | Status: AC
  Administered 2018-02-02: 12:00:00 via INTRAVENOUS
  Filled 2018-02-02: qty 5

## 2018-02-02 MED ORDER — SODIUM CHLORIDE 0.9% FLUSH
10.0000 mL | INTRAVENOUS | Status: DC | PRN
  Administered 2018-02-02: 10 mL
  Filled 2018-02-02: qty 10

## 2018-02-02 MED ORDER — PALONOSETRON HCL INJECTION 0.25 MG/5ML
INTRAVENOUS | Status: AC
  Filled 2018-02-02: qty 5

## 2018-02-02 NOTE — Patient Instructions (Signed)
[facility] Discharge Instructions for Patients Receiving Chemotherapy  Today you received the following chemotherapy agents Cisplatin and Etoposide  To help prevent nausea and vomiting after your treatment, we encourage you to take your nausea medication as directed.   If you develop nausea and vomiting that is not controlled by your nausea medication, call the clinic.   BELOW ARE SYMPTOMS THAT SHOULD BE REPORTED IMMEDIATELY:  *FEVER GREATER THAN 100.5 F  *CHILLS WITH OR WITHOUT FEVER  NAUSEA AND VOMITING THAT IS NOT CONTROLLED WITH YOUR NAUSEA MEDICATION  *UNUSUAL SHORTNESS OF BREATH  *UNUSUAL BRUISING OR BLEEDING  TENDERNESS IN MOUTH AND THROAT WITH OR WITHOUT PRESENCE OF ULCERS  *URINARY PROBLEMS  *BOWEL PROBLEMS  UNUSUAL RASH Items with * indicate a potential emergency and should be followed up as soon as possible.  Feel free to call the clinic should you have any questions or concerns. The clinic phone number is [phone].  Please show the CHEMO ALERT CARD at check-in to the Emergency Department and triage nurse.   

## 2018-02-02 NOTE — Progress Notes (Signed)
Went to infusion to introduce myself to patient as Arboriculturist and to ask if he had any financial questions or concerns. He states he has met everything with insurance therefore all of his stuff is taking care of.   Discussed the one-time $86 [place] that he may apply for to assist with personal expenses while going through treamtment. He states all of that is taking care of and he will save for someone else. Advised him should anything change,he may reach out to me with questions or concerns. Left my card with my contact information. He verbalized understanding.

## 2018-02-03 ENCOUNTER — Inpatient Hospital Stay: Payer: BLUE CROSS/BLUE SHIELD

## 2018-02-03 ENCOUNTER — Telehealth: Payer: Self-pay

## 2018-02-03 VITALS — BP 150/81 | HR 107 | Temp 98.8°F | Resp 18

## 2018-02-03 DIAGNOSIS — Z7982 Long term (current) use of aspirin: Secondary | ICD-10-CM | POA: Diagnosis not present

## 2018-02-03 DIAGNOSIS — C6290 Malignant neoplasm of unspecified testis, unspecified whether descended or undescended: Secondary | ICD-10-CM | POA: Diagnosis not present

## 2018-02-03 DIAGNOSIS — Z5111 Encounter for antineoplastic chemotherapy: Secondary | ICD-10-CM | POA: Diagnosis not present

## 2018-02-03 DIAGNOSIS — E669 Obesity, unspecified: Secondary | ICD-10-CM | POA: Diagnosis not present

## 2018-02-03 DIAGNOSIS — Z7189 Other specified counseling: Secondary | ICD-10-CM

## 2018-02-03 DIAGNOSIS — Z7689 Persons encountering health services in other specified circumstances: Secondary | ICD-10-CM | POA: Diagnosis not present

## 2018-02-03 MED ORDER — SODIUM CHLORIDE 0.9 % IV SOLN
20.0000 mg/m2 | Freq: Once | INTRAVENOUS | Status: AC
  Administered 2018-02-03: 52 mg via INTRAVENOUS
  Filled 2018-02-03: qty 52

## 2018-02-03 MED ORDER — DEXAMETHASONE SODIUM PHOSPHATE 10 MG/ML IJ SOLN
INTRAMUSCULAR | Status: AC
  Filled 2018-02-03: qty 1

## 2018-02-03 MED ORDER — SODIUM CHLORIDE 0.9 % IV SOLN
Freq: Once | INTRAVENOUS | Status: AC
  Administered 2018-02-03: 09:00:00 via INTRAVENOUS
  Filled 2018-02-03: qty 250

## 2018-02-03 MED ORDER — SODIUM CHLORIDE 0.9 % IV SOLN
100.0000 mg/m2 | Freq: Once | INTRAVENOUS | Status: AC
  Administered 2018-02-03: 260 mg via INTRAVENOUS
  Filled 2018-02-03: qty 13

## 2018-02-03 MED ORDER — POTASSIUM CHLORIDE 2 MEQ/ML IV SOLN
Freq: Once | INTRAVENOUS | Status: AC
  Administered 2018-02-03: 09:00:00 via INTRAVENOUS
  Filled 2018-02-03: qty 10

## 2018-02-03 MED ORDER — DEXAMETHASONE SODIUM PHOSPHATE 10 MG/ML IJ SOLN
10.0000 mg | Freq: Once | INTRAMUSCULAR | Status: AC
  Administered 2018-02-03: 10 mg via INTRAVENOUS

## 2018-02-03 NOTE — Patient Instructions (Signed)
[facility] Discharge Instructions for Patients Receiving Chemotherapy  Today you received the following chemotherapy agents Cisplatin and Etoposide  To help prevent nausea and vomiting after your treatment, we encourage you to take your nausea medication as directed.   If you develop nausea and vomiting that is not controlled by your nausea medication, call the clinic.   BELOW ARE SYMPTOMS THAT SHOULD BE REPORTED IMMEDIATELY:  *FEVER GREATER THAN 100.5 F  *CHILLS WITH OR WITHOUT FEVER  NAUSEA AND VOMITING THAT IS NOT CONTROLLED WITH YOUR NAUSEA MEDICATION  *UNUSUAL SHORTNESS OF BREATH  *UNUSUAL BRUISING OR BLEEDING  TENDERNESS IN MOUTH AND THROAT WITH OR WITHOUT PRESENCE OF ULCERS  *URINARY PROBLEMS  *BOWEL PROBLEMS  UNUSUAL RASH Items with * indicate a potential emergency and should be followed up as soon as possible.  Feel free to call the clinic should you have any questions or concerns. The clinic phone number is [phone].  Please show the CHEMO ALERT CARD at check-in to the Emergency Department and triage nurse.   

## 2018-02-03 NOTE — Telephone Encounter (Signed)
No provider scheduled with patient for 11/11[place] will inform us who will be covering this patient. Currently waiting for inf. For 11/13 to be approve was placed in infusion add on book. Per 11/22 los

## 2018-02-04 ENCOUNTER — Inpatient Hospital Stay: Payer: BLUE CROSS/BLUE SHIELD

## 2018-02-04 VITALS — BP 157/77 | HR 98 | Temp 98.2°F | Resp 17

## 2018-02-04 DIAGNOSIS — C6290 Malignant neoplasm of unspecified testis, unspecified whether descended or undescended: Secondary | ICD-10-CM | POA: Diagnosis not present

## 2018-02-04 DIAGNOSIS — Z5111 Encounter for antineoplastic chemotherapy: Secondary | ICD-10-CM | POA: Diagnosis not present

## 2018-02-04 DIAGNOSIS — Z7189 Other specified counseling: Secondary | ICD-10-CM

## 2018-02-04 DIAGNOSIS — Z7982 Long term (current) use of aspirin: Secondary | ICD-10-CM | POA: Diagnosis not present

## 2018-02-04 DIAGNOSIS — E669 Obesity, unspecified: Secondary | ICD-10-CM | POA: Diagnosis not present

## 2018-02-04 DIAGNOSIS — Z7689 Persons encountering health services in other specified circumstances: Secondary | ICD-10-CM | POA: Diagnosis not present

## 2018-02-04 MED ORDER — SODIUM CHLORIDE 0.9% FLUSH
10.0000 mL | INTRAVENOUS | Status: DC | PRN
  Administered 2018-02-04: 10 mL
  Filled 2018-02-04: qty 10

## 2018-02-04 MED ORDER — SODIUM CHLORIDE 0.9 % IV SOLN
100.0000 mg/m2 | Freq: Once | INTRAVENOUS | Status: AC
  Administered 2018-02-04: 260 mg via INTRAVENOUS
  Filled 2018-02-04: qty 13

## 2018-02-04 MED ORDER — SODIUM CHLORIDE 0.9 % IV SOLN
Freq: Once | INTRAVENOUS | Status: AC
  Administered 2018-02-04: 10:00:00 via INTRAVENOUS
  Filled 2018-02-04: qty 250

## 2018-02-04 MED ORDER — SODIUM CHLORIDE 0.9 % IV SOLN
Freq: Once | INTRAVENOUS | Status: AC
  Administered 2018-02-04: 12:00:00 via INTRAVENOUS
  Filled 2018-02-04: qty 5

## 2018-02-04 MED ORDER — PALONOSETRON HCL INJECTION 0.25 MG/5ML
INTRAVENOUS | Status: AC
  Filled 2018-02-04: qty 5

## 2018-02-04 MED ORDER — PALONOSETRON HCL INJECTION 0.25 MG/5ML
0.2500 mg | Freq: Once | INTRAVENOUS | Status: AC
  Administered 2018-02-04: 0.25 mg via INTRAVENOUS

## 2018-02-04 MED ORDER — SODIUM CHLORIDE 0.9 % IV SOLN
20.0000 mg/m2 | Freq: Once | INTRAVENOUS | Status: AC
  Administered 2018-02-04: 52 mg via INTRAVENOUS
  Filled 2018-02-04: qty 52

## 2018-02-04 MED ORDER — POTASSIUM CHLORIDE 2 MEQ/ML IV SOLN
Freq: Once | INTRAVENOUS | Status: AC
  Administered 2018-02-04: 10:00:00 via INTRAVENOUS
  Filled 2018-02-04: qty 10

## 2018-02-04 MED ORDER — HEPARIN SOD (PORK) LOCK FLUSH 100 UNIT/ML IV SOLN
500.0000 [IU] | Freq: Once | INTRAVENOUS | Status: AC | PRN
  Administered 2018-02-04: 500 [IU]
  Filled 2018-02-04: qty 5

## 2018-02-04 NOTE — Patient Instructions (Signed)
[facility] Discharge Instructions for Patients Receiving Chemotherapy  Today you received the following chemotherapy agents Cisplatin and Etoposide  To help prevent nausea and vomiting after your treatment, we encourage you to take your nausea medication as directed.   If you develop nausea and vomiting that is not controlled by your nausea medication, call the clinic.   BELOW ARE SYMPTOMS THAT SHOULD BE REPORTED IMMEDIATELY:  *FEVER GREATER THAN 100.5 F  *CHILLS WITH OR WITHOUT FEVER  NAUSEA AND VOMITING THAT IS NOT CONTROLLED WITH YOUR NAUSEA MEDICATION  *UNUSUAL SHORTNESS OF BREATH  *UNUSUAL BRUISING OR BLEEDING  TENDERNESS IN MOUTH AND THROAT WITH OR WITHOUT PRESENCE OF ULCERS  *URINARY PROBLEMS  *BOWEL PROBLEMS  UNUSUAL RASH Items with * indicate a potential emergency and should be followed up as soon as possible.  Feel free to call the clinic should you have any questions or concerns. The clinic phone number is [phone].  Please show the CHEMO ALERT CARD at check-in to the Emergency Department and triage nurse.   

## 2018-02-04 NOTE — Progress Notes (Signed)
Per Dr Irene Limbo ok to run post-hydration fluids at rate of 300 mL/hr and also ok to run hydration fluids during 30 minute wait between pre-meds and tx.

## 2018-02-05 ENCOUNTER — Inpatient Hospital Stay: Payer: BLUE CROSS/BLUE SHIELD

## 2018-02-05 VITALS — BP 140/76 | HR 95 | Temp 98.5°F | Resp 18

## 2018-02-05 DIAGNOSIS — Z7689 Persons encountering health services in other specified circumstances: Secondary | ICD-10-CM | POA: Diagnosis not present

## 2018-02-05 DIAGNOSIS — C6290 Malignant neoplasm of unspecified testis, unspecified whether descended or undescended: Secondary | ICD-10-CM

## 2018-02-05 DIAGNOSIS — Z5111 Encounter for antineoplastic chemotherapy: Secondary | ICD-10-CM | POA: Diagnosis not present

## 2018-02-05 DIAGNOSIS — E669 Obesity, unspecified: Secondary | ICD-10-CM | POA: Diagnosis not present

## 2018-02-05 DIAGNOSIS — Z7982 Long term (current) use of aspirin: Secondary | ICD-10-CM | POA: Diagnosis not present

## 2018-02-05 DIAGNOSIS — Z7189 Other specified counseling: Secondary | ICD-10-CM

## 2018-02-05 MED ORDER — SODIUM CHLORIDE 0.9 % IV SOLN
Freq: Once | INTRAVENOUS | Status: AC
  Administered 2018-02-05: 08:00:00 via INTRAVENOUS
  Filled 2018-02-05: qty 250

## 2018-02-05 MED ORDER — PALONOSETRON HCL INJECTION 0.25 MG/5ML
INTRAVENOUS | Status: AC
  Filled 2018-02-05: qty 5

## 2018-02-05 MED ORDER — HEPARIN SOD (PORK) LOCK FLUSH 100 UNIT/ML IV SOLN
500.0000 [IU] | Freq: Once | INTRAVENOUS | Status: AC | PRN
  Administered 2018-02-05: 500 [IU]
  Filled 2018-02-05: qty 5

## 2018-02-05 MED ORDER — SODIUM CHLORIDE 0.9 % IV SOLN
20.0000 mg/m2 | Freq: Once | INTRAVENOUS | Status: AC
  Administered 2018-02-05: 52 mg via INTRAVENOUS
  Filled 2018-02-05: qty 52

## 2018-02-05 MED ORDER — DEXAMETHASONE SODIUM PHOSPHATE 10 MG/ML IJ SOLN
10.0000 mg | Freq: Once | INTRAMUSCULAR | Status: AC
  Administered 2018-02-05: 10 mg via INTRAVENOUS

## 2018-02-05 MED ORDER — POTASSIUM CHLORIDE 2 MEQ/ML IV SOLN
Freq: Once | INTRAVENOUS | Status: AC
  Administered 2018-02-05: 09:00:00 via INTRAVENOUS
  Filled 2018-02-05: qty 10

## 2018-02-05 MED ORDER — SODIUM CHLORIDE 0.9 % IV SOLN
100.0000 mg/m2 | Freq: Once | INTRAVENOUS | Status: AC
  Administered 2018-02-05: 260 mg via INTRAVENOUS
  Filled 2018-02-05: qty 13

## 2018-02-05 MED ORDER — SODIUM CHLORIDE 0.9% FLUSH
10.0000 mL | INTRAVENOUS | Status: DC | PRN
  Administered 2018-02-05: 10 mL
  Filled 2018-02-05: qty 10

## 2018-02-05 MED ORDER — DEXAMETHASONE SODIUM PHOSPHATE 10 MG/ML IJ SOLN
INTRAMUSCULAR | Status: AC
  Filled 2018-02-05: qty 1

## 2018-02-05 NOTE — Patient Instructions (Signed)
[facility] Discharge Instructions for Patients Receiving Chemotherapy  Today you received the following chemotherapy agents:  Etoposide and Cisplatin.  To help prevent nausea and vomiting after your treatment, we encourage you to take your nausea medication as directed.   If you develop nausea and vomiting that is not controlled by your nausea medication, call the clinic.   BELOW ARE SYMPTOMS THAT SHOULD BE REPORTED IMMEDIATELY:  *FEVER GREATER THAN 100.5 F  *CHILLS WITH OR WITHOUT FEVER  NAUSEA AND VOMITING THAT IS NOT CONTROLLED WITH YOUR NAUSEA MEDICATION  *UNUSUAL SHORTNESS OF BREATH  *UNUSUAL BRUISING OR BLEEDING  TENDERNESS IN MOUTH AND THROAT WITH OR WITHOUT PRESENCE OF ULCERS  *URINARY PROBLEMS  *BOWEL PROBLEMS  UNUSUAL RASH Items with * indicate a potential emergency and should be followed up as soon as possible.  Feel free to call the clinic should you have any questions or concerns. The clinic phone number is [phone].  Please show the CHEMO ALERT CARD at check-in to the Emergency Department and triage nurse.   

## 2018-02-06 ENCOUNTER — Inpatient Hospital Stay: Payer: BLUE CROSS/BLUE SHIELD

## 2018-02-06 VITALS — BP 141/79 | HR 87 | Temp 98.5°F | Resp 20

## 2018-02-06 DIAGNOSIS — E669 Obesity, unspecified: Secondary | ICD-10-CM | POA: Diagnosis not present

## 2018-02-06 DIAGNOSIS — Z7189 Other specified counseling: Secondary | ICD-10-CM

## 2018-02-06 DIAGNOSIS — C6290 Malignant neoplasm of unspecified testis, unspecified whether descended or undescended: Secondary | ICD-10-CM | POA: Diagnosis not present

## 2018-02-06 DIAGNOSIS — Z7982 Long term (current) use of aspirin: Secondary | ICD-10-CM | POA: Diagnosis not present

## 2018-02-06 DIAGNOSIS — Z5111 Encounter for antineoplastic chemotherapy: Secondary | ICD-10-CM | POA: Diagnosis not present

## 2018-02-06 DIAGNOSIS — Z7689 Persons encountering health services in other specified circumstances: Secondary | ICD-10-CM | POA: Diagnosis not present

## 2018-02-06 MED ORDER — SODIUM CHLORIDE 0.9% FLUSH
10.0000 mL | INTRAVENOUS | Status: DC | PRN
  Administered 2018-02-06: 10 mL
  Filled 2018-02-06: qty 10

## 2018-02-06 MED ORDER — SODIUM CHLORIDE 0.9 % IV SOLN
100.0000 mg/m2 | Freq: Once | INTRAVENOUS | Status: AC
  Administered 2018-02-06: 260 mg via INTRAVENOUS
  Filled 2018-02-06: qty 13

## 2018-02-06 MED ORDER — SODIUM CHLORIDE 0.9 % IV SOLN
20.0000 mg/m2 | Freq: Once | INTRAVENOUS | Status: AC
  Administered 2018-02-06: 52 mg via INTRAVENOUS
  Filled 2018-02-06: qty 52

## 2018-02-06 MED ORDER — HEPARIN SOD (PORK) LOCK FLUSH 100 UNIT/ML IV SOLN
500.0000 [IU] | Freq: Once | INTRAVENOUS | Status: AC | PRN
  Administered 2018-02-06: 500 [IU]
  Filled 2018-02-06: qty 5

## 2018-02-06 MED ORDER — PALONOSETRON HCL INJECTION 0.25 MG/5ML
INTRAVENOUS | Status: AC
  Filled 2018-02-06: qty 5

## 2018-02-06 MED ORDER — PALONOSETRON HCL INJECTION 0.25 MG/5ML
0.2500 mg | Freq: Once | INTRAVENOUS | Status: AC
  Administered 2018-02-06: 0.25 mg via INTRAVENOUS

## 2018-02-06 MED ORDER — SODIUM CHLORIDE 0.9 % IV SOLN
Freq: Once | INTRAVENOUS | Status: AC
  Administered 2018-02-06: 12:00:00 via INTRAVENOUS
  Filled 2018-02-06: qty 5

## 2018-02-06 MED ORDER — SODIUM CHLORIDE 0.9 % IV SOLN
Freq: Once | INTRAVENOUS | Status: AC
  Administered 2018-02-06: 09:00:00 via INTRAVENOUS
  Filled 2018-02-06: qty 250

## 2018-02-06 MED ORDER — POTASSIUM CHLORIDE 2 MEQ/ML IV SOLN
Freq: Once | INTRAVENOUS | Status: AC
  Administered 2018-02-06: 10:00:00 via INTRAVENOUS
  Filled 2018-02-06: qty 10

## 2018-02-09 ENCOUNTER — Inpatient Hospital Stay: Payer: BLUE CROSS/BLUE SHIELD

## 2018-02-09 ENCOUNTER — Ambulatory Visit: Payer: BLUE CROSS/BLUE SHIELD

## 2018-02-09 VITALS — BP 114/71 | HR 115 | Temp 98.4°F | Resp 18

## 2018-02-09 DIAGNOSIS — Z5111 Encounter for antineoplastic chemotherapy: Secondary | ICD-10-CM | POA: Diagnosis not present

## 2018-02-09 DIAGNOSIS — Z7189 Other specified counseling: Secondary | ICD-10-CM

## 2018-02-09 DIAGNOSIS — Z7982 Long term (current) use of aspirin: Secondary | ICD-10-CM | POA: Diagnosis not present

## 2018-02-09 DIAGNOSIS — Z7689 Persons encountering health services in other specified circumstances: Secondary | ICD-10-CM | POA: Diagnosis not present

## 2018-02-09 DIAGNOSIS — E669 Obesity, unspecified: Secondary | ICD-10-CM | POA: Diagnosis not present

## 2018-02-09 DIAGNOSIS — C6290 Malignant neoplasm of unspecified testis, unspecified whether descended or undescended: Secondary | ICD-10-CM | POA: Diagnosis not present

## 2018-02-09 MED ORDER — PEGFILGRASTIM-CBQV 6 MG/0.6ML ~~LOC~~ SOSY
PREFILLED_SYRINGE | SUBCUTANEOUS | Status: AC
  Filled 2018-02-09: qty 0.6

## 2018-02-09 MED ORDER — PEGFILGRASTIM-CBQV 6 MG/0.6ML ~~LOC~~ SOSY
6.0000 mg | PREFILLED_SYRINGE | Freq: Once | SUBCUTANEOUS | Status: AC
  Administered 2018-02-09: 6 mg via SUBCUTANEOUS

## 2018-02-13 ENCOUNTER — Other Ambulatory Visit: Payer: Self-pay | Admitting: Hematology

## 2018-02-20 ENCOUNTER — Other Ambulatory Visit: Payer: Self-pay | Admitting: *Deleted

## 2018-02-20 DIAGNOSIS — C801 Malignant (primary) neoplasm, unspecified: Secondary | ICD-10-CM

## 2018-02-20 DIAGNOSIS — C6290 Malignant neoplasm of unspecified testis, unspecified whether descended or undescended: Secondary | ICD-10-CM

## 2018-02-23 ENCOUNTER — Inpatient Hospital Stay: Payer: BLUE CROSS/BLUE SHIELD

## 2018-02-23 ENCOUNTER — Telehealth: Payer: Self-pay | Admitting: Medical

## 2018-02-23 ENCOUNTER — Inpatient Hospital Stay: Payer: BLUE CROSS/BLUE SHIELD | Attending: Hematology

## 2018-02-23 ENCOUNTER — Inpatient Hospital Stay (HOSPITAL_BASED_OUTPATIENT_CLINIC_OR_DEPARTMENT_OTHER): Payer: BLUE CROSS/BLUE SHIELD | Admitting: Medical

## 2018-02-23 VITALS — BP 135/83 | HR 89 | Temp 98.6°F | Resp 18

## 2018-02-23 DIAGNOSIS — C6290 Malignant neoplasm of unspecified testis, unspecified whether descended or undescended: Secondary | ICD-10-CM

## 2018-02-23 DIAGNOSIS — Z5111 Encounter for antineoplastic chemotherapy: Secondary | ICD-10-CM | POA: Insufficient documentation

## 2018-02-23 DIAGNOSIS — Z7982 Long term (current) use of aspirin: Secondary | ICD-10-CM | POA: Insufficient documentation

## 2018-02-23 DIAGNOSIS — Z79899 Other long term (current) drug therapy: Secondary | ICD-10-CM | POA: Insufficient documentation

## 2018-02-23 DIAGNOSIS — N400 Enlarged prostate without lower urinary tract symptoms: Secondary | ICD-10-CM | POA: Diagnosis not present

## 2018-02-23 DIAGNOSIS — E669 Obesity, unspecified: Secondary | ICD-10-CM | POA: Diagnosis not present

## 2018-02-23 DIAGNOSIS — E119 Type 2 diabetes mellitus without complications: Secondary | ICD-10-CM | POA: Diagnosis not present

## 2018-02-23 DIAGNOSIS — Z7189 Other specified counseling: Secondary | ICD-10-CM

## 2018-02-23 DIAGNOSIS — Z7689 Persons encountering health services in other specified circumstances: Secondary | ICD-10-CM | POA: Insufficient documentation

## 2018-02-23 DIAGNOSIS — I10 Essential (primary) hypertension: Secondary | ICD-10-CM | POA: Diagnosis not present

## 2018-02-23 LAB — CBC WITH DIFFERENTIAL (CANCER CENTER ONLY)
Abs Immature Granulocytes: 0.91 10*3/uL — ABNORMAL HIGH (ref 0.00–0.07)
BASOS ABS: 0.1 10*3/uL (ref 0.0–0.1)
BASOS PCT: 1 %
EOS ABS: 0 10*3/uL (ref 0.0–0.5)
EOS PCT: 0 %
HEMATOCRIT: 38.3 % — AB (ref 39.0–52.0)
Hemoglobin: 12.5 g/dL — ABNORMAL LOW (ref 13.0–17.0)
Immature Granulocytes: 7 %
Lymphocytes Relative: 21 %
Lymphs Abs: 2.8 10*3/uL (ref 0.7–4.0)
MCH: 28.3 pg (ref 26.0–34.0)
MCHC: 32.6 g/dL (ref 30.0–36.0)
MCV: 86.7 fL (ref 80.0–100.0)
Monocytes Absolute: 1 10*3/uL (ref 0.1–1.0)
Monocytes Relative: 8 %
NEUTROS ABS: 8.2 10*3/uL — AB (ref 1.7–7.7)
NRBC: 0.3 % — AB (ref 0.0–0.2)
Neutrophils Relative %: 63 %
PLATELETS: 200 10*3/uL (ref 150–400)
RBC: 4.42 MIL/uL (ref 4.22–5.81)
RDW: 15.7 % — AB (ref 11.5–15.5)
WBC Count: 13.1 10*3/uL — ABNORMAL HIGH (ref 4.0–10.5)

## 2018-02-23 LAB — CMP (CANCER CENTER ONLY)
ALT: 35 U/L (ref 0–44)
ANION GAP: 10 (ref 5–15)
AST: 16 U/L (ref 15–41)
Albumin: 3.4 g/dL — ABNORMAL LOW (ref 3.5–5.0)
Alkaline Phosphatase: 144 U/L — ABNORMAL HIGH (ref 38–126)
BUN: 12 mg/dL (ref 6–20)
CHLORIDE: 98 mmol/L (ref 98–111)
CO2: 27 mmol/L (ref 22–32)
Calcium: 9.1 mg/dL (ref 8.9–10.3)
Creatinine: 0.78 mg/dL (ref 0.61–1.24)
Glucose, Bld: 302 mg/dL — ABNORMAL HIGH (ref 70–99)
POTASSIUM: 5 mmol/L (ref 3.5–5.1)
Sodium: 135 mmol/L (ref 135–145)
Total Bilirubin: 0.5 mg/dL (ref 0.3–1.2)
Total Protein: 6.4 g/dL — ABNORMAL LOW (ref 6.5–8.1)

## 2018-02-23 LAB — MAGNESIUM: Magnesium: 1.6 mg/dL — ABNORMAL LOW (ref 1.7–2.4)

## 2018-02-23 MED ORDER — POTASSIUM CHLORIDE 2 MEQ/ML IV SOLN
Freq: Once | INTRAVENOUS | Status: AC
  Administered 2018-02-23: 10:00:00 via INTRAVENOUS
  Filled 2018-02-23: qty 10

## 2018-02-23 MED ORDER — SODIUM CHLORIDE 0.9 % IV SOLN
Freq: Once | INTRAVENOUS | Status: AC
  Administered 2018-02-23: 12:00:00 via INTRAVENOUS
  Filled 2018-02-23: qty 5

## 2018-02-23 MED ORDER — SODIUM CHLORIDE 0.9 % IV SOLN
20.0000 mg/m2 | Freq: Once | INTRAVENOUS | Status: AC
  Administered 2018-02-23: 52 mg via INTRAVENOUS
  Filled 2018-02-23: qty 52

## 2018-02-23 MED ORDER — PALONOSETRON HCL INJECTION 0.25 MG/5ML
0.2500 mg | Freq: Once | INTRAVENOUS | Status: AC
  Administered 2018-02-23: 0.25 mg via INTRAVENOUS

## 2018-02-23 MED ORDER — SODIUM CHLORIDE 0.9 % IV SOLN
Freq: Once | INTRAVENOUS | Status: AC
  Administered 2018-02-23: 09:00:00 via INTRAVENOUS
  Filled 2018-02-23: qty 250

## 2018-02-23 MED ORDER — SODIUM CHLORIDE 0.9% FLUSH
10.0000 mL | INTRAVENOUS | Status: DC | PRN
  Administered 2018-02-23: 10 mL
  Filled 2018-02-23: qty 10

## 2018-02-23 MED ORDER — HEPARIN SOD (PORK) LOCK FLUSH 100 UNIT/ML IV SOLN
500.0000 [IU] | Freq: Once | INTRAVENOUS | Status: AC | PRN
  Administered 2018-02-23: 500 [IU]
  Filled 2018-02-23: qty 5

## 2018-02-23 MED ORDER — PALONOSETRON HCL INJECTION 0.25 MG/5ML
INTRAVENOUS | Status: AC
  Filled 2018-02-23: qty 5

## 2018-02-23 MED ORDER — SODIUM CHLORIDE 0.9 % IV SOLN
100.0000 mg/m2 | Freq: Once | INTRAVENOUS | Status: AC
  Administered 2018-02-23: 260 mg via INTRAVENOUS
  Filled 2018-02-23: qty 13

## 2018-02-23 NOTE — Patient Instructions (Signed)
[facility] Discharge Instructions for Patients Receiving Chemotherapy  Today you received the following chemotherapy agents:  Etoposide and Cisplatin.  To help prevent nausea and vomiting after your treatment, we encourage you to take your nausea medication as directed.   If you develop nausea and vomiting that is not controlled by your nausea medication, call the clinic.   BELOW ARE SYMPTOMS THAT SHOULD BE REPORTED IMMEDIATELY:  *FEVER GREATER THAN 100.5 F  *CHILLS WITH OR WITHOUT FEVER  NAUSEA AND VOMITING THAT IS NOT CONTROLLED WITH YOUR NAUSEA MEDICATION  *UNUSUAL SHORTNESS OF BREATH  *UNUSUAL BRUISING OR BLEEDING  TENDERNESS IN MOUTH AND THROAT WITH OR WITHOUT PRESENCE OF ULCERS  *URINARY PROBLEMS  *BOWEL PROBLEMS  UNUSUAL RASH Items with * indicate a potential emergency and should be followed up as soon as possible.  Feel free to call the clinic should you have any questions or concerns. The clinic phone number is [phone].  Please show the CHEMO ALERT CARD at check-in to the Emergency Department and triage nurse.   

## 2018-02-23 NOTE — Telephone Encounter (Signed)
Pt sched per 11/11 sch message  °

## 2018-02-23 NOTE — Progress Notes (Signed)
Per Dr. Julien Nordmann, ok to run post fluids at 385mL/hr.

## 2018-02-24 ENCOUNTER — Inpatient Hospital Stay: Payer: BLUE CROSS/BLUE SHIELD

## 2018-02-24 VITALS — BP 136/77 | HR 100 | Temp 99.3°F | Resp 18

## 2018-02-24 DIAGNOSIS — C6290 Malignant neoplasm of unspecified testis, unspecified whether descended or undescended: Secondary | ICD-10-CM

## 2018-02-24 DIAGNOSIS — E119 Type 2 diabetes mellitus without complications: Secondary | ICD-10-CM | POA: Diagnosis not present

## 2018-02-24 DIAGNOSIS — N400 Enlarged prostate without lower urinary tract symptoms: Secondary | ICD-10-CM | POA: Diagnosis not present

## 2018-02-24 DIAGNOSIS — I10 Essential (primary) hypertension: Secondary | ICD-10-CM | POA: Diagnosis not present

## 2018-02-24 DIAGNOSIS — Z7189 Other specified counseling: Secondary | ICD-10-CM

## 2018-02-24 DIAGNOSIS — Z7982 Long term (current) use of aspirin: Secondary | ICD-10-CM | POA: Diagnosis not present

## 2018-02-24 DIAGNOSIS — E669 Obesity, unspecified: Secondary | ICD-10-CM | POA: Diagnosis not present

## 2018-02-24 DIAGNOSIS — Z5111 Encounter for antineoplastic chemotherapy: Secondary | ICD-10-CM | POA: Diagnosis not present

## 2018-02-24 DIAGNOSIS — Z7689 Persons encountering health services in other specified circumstances: Secondary | ICD-10-CM | POA: Diagnosis not present

## 2018-02-24 DIAGNOSIS — Z79899 Other long term (current) drug therapy: Secondary | ICD-10-CM | POA: Diagnosis not present

## 2018-02-24 MED ORDER — SODIUM CHLORIDE 0.9% FLUSH
10.0000 mL | INTRAVENOUS | Status: DC | PRN
  Administered 2018-02-24: 10 mL
  Filled 2018-02-24: qty 10

## 2018-02-24 MED ORDER — DEXAMETHASONE SODIUM PHOSPHATE 10 MG/ML IJ SOLN
10.0000 mg | Freq: Once | INTRAMUSCULAR | Status: AC
  Administered 2018-02-24: 10 mg via INTRAVENOUS

## 2018-02-24 MED ORDER — DEXAMETHASONE SODIUM PHOSPHATE 10 MG/ML IJ SOLN
INTRAMUSCULAR | Status: AC
  Filled 2018-02-24: qty 1

## 2018-02-24 MED ORDER — SODIUM CHLORIDE 0.9 % IV SOLN
Freq: Once | INTRAVENOUS | Status: AC
  Administered 2018-02-24: 09:00:00 via INTRAVENOUS
  Filled 2018-02-24: qty 250

## 2018-02-24 MED ORDER — SODIUM CHLORIDE 0.9 % IV SOLN
100.0000 mg/m2 | Freq: Once | INTRAVENOUS | Status: AC
  Administered 2018-02-24: 260 mg via INTRAVENOUS
  Filled 2018-02-24: qty 13

## 2018-02-24 MED ORDER — POTASSIUM CHLORIDE 2 MEQ/ML IV SOLN
Freq: Once | INTRAVENOUS | Status: AC
  Administered 2018-02-24: 09:00:00 via INTRAVENOUS
  Filled 2018-02-24: qty 10

## 2018-02-24 MED ORDER — SODIUM CHLORIDE 0.9 % IV SOLN
20.0000 mg/m2 | Freq: Once | INTRAVENOUS | Status: AC
  Administered 2018-02-24: 52 mg via INTRAVENOUS
  Filled 2018-02-24: qty 52

## 2018-02-24 MED ORDER — HEPARIN SOD (PORK) LOCK FLUSH 100 UNIT/ML IV SOLN
500.0000 [IU] | Freq: Once | INTRAVENOUS | Status: AC | PRN
  Administered 2018-02-24: 500 [IU]
  Filled 2018-02-24: qty 5

## 2018-02-24 NOTE — Patient Instructions (Signed)
[facility] Discharge Instructions for Patients Receiving Chemotherapy  Today you received the following chemotherapy agents:  Etoposide and Cisplatin.  To help prevent nausea and vomiting after your treatment, we encourage you to take your nausea medication as directed.   If you develop nausea and vomiting that is not controlled by your nausea medication, call the clinic.   BELOW ARE SYMPTOMS THAT SHOULD BE REPORTED IMMEDIATELY:  *FEVER GREATER THAN 100.5 F  *CHILLS WITH OR WITHOUT FEVER  NAUSEA AND VOMITING THAT IS NOT CONTROLLED WITH YOUR NAUSEA MEDICATION  *UNUSUAL SHORTNESS OF BREATH  *UNUSUAL BRUISING OR BLEEDING  TENDERNESS IN MOUTH AND THROAT WITH OR WITHOUT PRESENCE OF ULCERS  *URINARY PROBLEMS  *BOWEL PROBLEMS  UNUSUAL RASH Items with * indicate a potential emergency and should be followed up as soon as possible.  Feel free to call the clinic should you have any questions or concerns. The clinic phone number is [phone].  Please show the CHEMO ALERT CARD at check-in to the Emergency Department and triage nurse.   

## 2018-02-25 ENCOUNTER — Inpatient Hospital Stay: Payer: BLUE CROSS/BLUE SHIELD

## 2018-02-25 VITALS — BP 146/83 | HR 100 | Temp 98.1°F | Resp 18 | Wt 282.0 lb

## 2018-02-25 DIAGNOSIS — N400 Enlarged prostate without lower urinary tract symptoms: Secondary | ICD-10-CM | POA: Diagnosis not present

## 2018-02-25 DIAGNOSIS — Z7189 Other specified counseling: Secondary | ICD-10-CM

## 2018-02-25 DIAGNOSIS — Z5111 Encounter for antineoplastic chemotherapy: Secondary | ICD-10-CM | POA: Diagnosis not present

## 2018-02-25 DIAGNOSIS — C6290 Malignant neoplasm of unspecified testis, unspecified whether descended or undescended: Secondary | ICD-10-CM

## 2018-02-25 DIAGNOSIS — Z79899 Other long term (current) drug therapy: Secondary | ICD-10-CM | POA: Diagnosis not present

## 2018-02-25 DIAGNOSIS — E119 Type 2 diabetes mellitus without complications: Secondary | ICD-10-CM | POA: Diagnosis not present

## 2018-02-25 DIAGNOSIS — I10 Essential (primary) hypertension: Secondary | ICD-10-CM | POA: Diagnosis not present

## 2018-02-25 DIAGNOSIS — E669 Obesity, unspecified: Secondary | ICD-10-CM | POA: Diagnosis not present

## 2018-02-25 DIAGNOSIS — Z7982 Long term (current) use of aspirin: Secondary | ICD-10-CM | POA: Diagnosis not present

## 2018-02-25 DIAGNOSIS — Z7689 Persons encountering health services in other specified circumstances: Secondary | ICD-10-CM | POA: Diagnosis not present

## 2018-02-25 MED ORDER — SODIUM CHLORIDE 0.9% FLUSH
10.0000 mL | INTRAVENOUS | Status: DC | PRN
  Administered 2018-02-25: 10 mL
  Filled 2018-02-25: qty 10

## 2018-02-25 MED ORDER — SODIUM CHLORIDE 0.9 % IV SOLN
100.0000 mg/m2 | Freq: Once | INTRAVENOUS | Status: AC
  Administered 2018-02-25: 260 mg via INTRAVENOUS
  Filled 2018-02-25: qty 13

## 2018-02-25 MED ORDER — PALONOSETRON HCL INJECTION 0.25 MG/5ML
0.2500 mg | Freq: Once | INTRAVENOUS | Status: AC
  Administered 2018-02-25: 0.25 mg via INTRAVENOUS

## 2018-02-25 MED ORDER — PALONOSETRON HCL INJECTION 0.25 MG/5ML
INTRAVENOUS | Status: AC
  Filled 2018-02-25: qty 5

## 2018-02-25 MED ORDER — HEPARIN SOD (PORK) LOCK FLUSH 100 UNIT/ML IV SOLN
500.0000 [IU] | Freq: Once | INTRAVENOUS | Status: AC | PRN
  Administered 2018-02-25: 500 [IU]
  Filled 2018-02-25: qty 5

## 2018-02-25 MED ORDER — POTASSIUM CHLORIDE 2 MEQ/ML IV SOLN
Freq: Once | INTRAVENOUS | Status: AC
  Administered 2018-02-25: 08:00:00 via INTRAVENOUS
  Filled 2018-02-25: qty 10

## 2018-02-25 MED ORDER — SODIUM CHLORIDE 0.9 % IV SOLN
Freq: Once | INTRAVENOUS | Status: AC
  Administered 2018-02-25: 08:00:00 via INTRAVENOUS
  Filled 2018-02-25: qty 250

## 2018-02-25 MED ORDER — SODIUM CHLORIDE 0.9 % IV SOLN
20.0000 mg/m2 | Freq: Once | INTRAVENOUS | Status: AC
  Administered 2018-02-25: 52 mg via INTRAVENOUS
  Filled 2018-02-25: qty 52

## 2018-02-25 MED ORDER — SODIUM CHLORIDE 0.9 % IV SOLN
Freq: Once | INTRAVENOUS | Status: AC
  Administered 2018-02-25: 11:00:00 via INTRAVENOUS
  Filled 2018-02-25: qty 5

## 2018-02-25 NOTE — Progress Notes (Signed)
Neil Berg was seen in the infusion room today prior to beginning cycle 4 of cisplatin and etoposide.  His counts were reviewed.  These were stable without any acute issues of concern.  He continues to tolerate his chemotherapy well with no adverse side effects.  Plans are to proceed with his treatment as scheduled.  The patient does not have a follow-up appointment with Dr. Sullivan Lone after completion of his chemotherapy however.  Sandi Mealy, MHS, PA-C Physician assistant

## 2018-02-25 NOTE — Patient Instructions (Signed)
[facility] Discharge Instructions for Patients Receiving Chemotherapy  Today you received the following chemotherapy agents Cisplatin (Platinol) & Etoposide (Vepeside).  To help prevent nausea and vomiting after your treatment, we encourage you to take your nausea medication as prescribed.   If you develop nausea and vomiting that is not controlled by your nausea medication, call the clinic.   BELOW ARE SYMPTOMS THAT SHOULD BE REPORTED IMMEDIATELY:  *FEVER GREATER THAN 100.5 F  *CHILLS WITH OR WITHOUT FEVER  NAUSEA AND VOMITING THAT IS NOT CONTROLLED WITH YOUR NAUSEA MEDICATION  *UNUSUAL SHORTNESS OF BREATH  *UNUSUAL BRUISING OR BLEEDING  TENDERNESS IN MOUTH AND THROAT WITH OR WITHOUT PRESENCE OF ULCERS  *URINARY PROBLEMS  *BOWEL PROBLEMS  UNUSUAL RASH Items with * indicate a potential emergency and should be followed up as soon as possible.  Feel free to call the clinic should you have any questions or concerns. The clinic phone number is [phone].  Please show the [place] at check-in to the Emergency Department and triage nurse.

## 2018-02-26 ENCOUNTER — Inpatient Hospital Stay: Payer: BLUE CROSS/BLUE SHIELD

## 2018-02-26 VITALS — BP 130/83 | HR 90 | Temp 98.6°F | Resp 16

## 2018-02-26 DIAGNOSIS — Z7689 Persons encountering health services in other specified circumstances: Secondary | ICD-10-CM | POA: Diagnosis not present

## 2018-02-26 DIAGNOSIS — Z7982 Long term (current) use of aspirin: Secondary | ICD-10-CM | POA: Diagnosis not present

## 2018-02-26 DIAGNOSIS — C6290 Malignant neoplasm of unspecified testis, unspecified whether descended or undescended: Secondary | ICD-10-CM

## 2018-02-26 DIAGNOSIS — Z5111 Encounter for antineoplastic chemotherapy: Secondary | ICD-10-CM | POA: Diagnosis not present

## 2018-02-26 DIAGNOSIS — Z79899 Other long term (current) drug therapy: Secondary | ICD-10-CM | POA: Diagnosis not present

## 2018-02-26 DIAGNOSIS — E119 Type 2 diabetes mellitus without complications: Secondary | ICD-10-CM | POA: Diagnosis not present

## 2018-02-26 DIAGNOSIS — Z7189 Other specified counseling: Secondary | ICD-10-CM

## 2018-02-26 DIAGNOSIS — E669 Obesity, unspecified: Secondary | ICD-10-CM | POA: Diagnosis not present

## 2018-02-26 DIAGNOSIS — I10 Essential (primary) hypertension: Secondary | ICD-10-CM | POA: Diagnosis not present

## 2018-02-26 DIAGNOSIS — N400 Enlarged prostate without lower urinary tract symptoms: Secondary | ICD-10-CM | POA: Diagnosis not present

## 2018-02-26 MED ORDER — HEPARIN SOD (PORK) LOCK FLUSH 100 UNIT/ML IV SOLN
500.0000 [IU] | Freq: Once | INTRAVENOUS | Status: AC | PRN
  Administered 2018-02-26: 500 [IU]
  Filled 2018-02-26: qty 5

## 2018-02-26 MED ORDER — POTASSIUM CHLORIDE 2 MEQ/ML IV SOLN
Freq: Once | INTRAVENOUS | Status: AC
  Administered 2018-02-26: 09:00:00 via INTRAVENOUS
  Filled 2018-02-26: qty 10

## 2018-02-26 MED ORDER — SODIUM CHLORIDE 0.9 % IV SOLN
100.0000 mg/m2 | Freq: Once | INTRAVENOUS | Status: AC
  Administered 2018-02-26: 260 mg via INTRAVENOUS
  Filled 2018-02-26: qty 13

## 2018-02-26 MED ORDER — SODIUM CHLORIDE 0.9 % IV SOLN
20.0000 mg/m2 | Freq: Once | INTRAVENOUS | Status: AC
  Administered 2018-02-26: 52 mg via INTRAVENOUS
  Filled 2018-02-26: qty 52

## 2018-02-26 MED ORDER — DEXAMETHASONE SODIUM PHOSPHATE 10 MG/ML IJ SOLN
10.0000 mg | Freq: Once | INTRAMUSCULAR | Status: AC
  Administered 2018-02-26: 10 mg via INTRAVENOUS

## 2018-02-26 MED ORDER — DEXAMETHASONE SODIUM PHOSPHATE 10 MG/ML IJ SOLN
INTRAMUSCULAR | Status: AC
  Filled 2018-02-26: qty 1

## 2018-02-26 MED ORDER — SODIUM CHLORIDE 0.9 % IV SOLN
Freq: Once | INTRAVENOUS | Status: AC
  Administered 2018-02-26: 09:00:00 via INTRAVENOUS
  Filled 2018-02-26: qty 250

## 2018-02-26 MED ORDER — SODIUM CHLORIDE 0.9% FLUSH
10.0000 mL | INTRAVENOUS | Status: DC | PRN
  Administered 2018-02-26: 10 mL
  Filled 2018-02-26: qty 10

## 2018-02-26 NOTE — Patient Instructions (Signed)
[facility] Discharge Instructions for Patients Receiving Chemotherapy  Today you received the following chemotherapy agents: Etoposide (Vepesid) and Cisplatin (Platinol)  To help prevent nausea and vomiting after your treatment, we encourage you to take your nausea medication as directed.    If you develop nausea and vomiting that is not controlled by your nausea medication, call the clinic.   BELOW ARE SYMPTOMS THAT SHOULD BE REPORTED IMMEDIATELY:  *FEVER GREATER THAN 100.5 F  *CHILLS WITH OR WITHOUT FEVER  NAUSEA AND VOMITING THAT IS NOT CONTROLLED WITH YOUR NAUSEA MEDICATION  *UNUSUAL SHORTNESS OF BREATH  *UNUSUAL BRUISING OR BLEEDING  TENDERNESS IN MOUTH AND THROAT WITH OR WITHOUT PRESENCE OF ULCERS  *URINARY PROBLEMS  *BOWEL PROBLEMS  UNUSUAL RASH Items with * indicate a potential emergency and should be followed up as soon as possible.  Feel free to call the clinic should you have any questions or concerns. The clinic phone number is [phone].  Please show the [facility] at check-in to the Emergency Department and triage nurse.

## 2018-02-27 ENCOUNTER — Inpatient Hospital Stay: Payer: BLUE CROSS/BLUE SHIELD

## 2018-02-27 VITALS — BP 135/77 | HR 91 | Temp 98.6°F | Resp 16

## 2018-02-27 DIAGNOSIS — E669 Obesity, unspecified: Secondary | ICD-10-CM | POA: Diagnosis not present

## 2018-02-27 DIAGNOSIS — Z5111 Encounter for antineoplastic chemotherapy: Secondary | ICD-10-CM | POA: Diagnosis not present

## 2018-02-27 DIAGNOSIS — Z7689 Persons encountering health services in other specified circumstances: Secondary | ICD-10-CM | POA: Diagnosis not present

## 2018-02-27 DIAGNOSIS — Z7189 Other specified counseling: Secondary | ICD-10-CM

## 2018-02-27 DIAGNOSIS — Z79899 Other long term (current) drug therapy: Secondary | ICD-10-CM | POA: Diagnosis not present

## 2018-02-27 DIAGNOSIS — C6290 Malignant neoplasm of unspecified testis, unspecified whether descended or undescended: Secondary | ICD-10-CM

## 2018-02-27 DIAGNOSIS — E119 Type 2 diabetes mellitus without complications: Secondary | ICD-10-CM | POA: Diagnosis not present

## 2018-02-27 DIAGNOSIS — I10 Essential (primary) hypertension: Secondary | ICD-10-CM | POA: Diagnosis not present

## 2018-02-27 DIAGNOSIS — N400 Enlarged prostate without lower urinary tract symptoms: Secondary | ICD-10-CM | POA: Diagnosis not present

## 2018-02-27 DIAGNOSIS — Z7982 Long term (current) use of aspirin: Secondary | ICD-10-CM | POA: Diagnosis not present

## 2018-02-27 MED ORDER — SODIUM CHLORIDE 0.9 % IV SOLN
Freq: Once | INTRAVENOUS | Status: AC
  Administered 2018-02-27: 12:00:00 via INTRAVENOUS
  Filled 2018-02-27: qty 5

## 2018-02-27 MED ORDER — POTASSIUM CHLORIDE 2 MEQ/ML IV SOLN
Freq: Once | INTRAVENOUS | Status: AC
  Administered 2018-02-27: 09:00:00 via INTRAVENOUS
  Filled 2018-02-27: qty 10

## 2018-02-27 MED ORDER — PALONOSETRON HCL INJECTION 0.25 MG/5ML
INTRAVENOUS | Status: AC
  Filled 2018-02-27: qty 5

## 2018-02-27 MED ORDER — SODIUM CHLORIDE 0.9 % IV SOLN
20.0000 mg/m2 | Freq: Once | INTRAVENOUS | Status: AC
  Administered 2018-02-27: 52 mg via INTRAVENOUS
  Filled 2018-02-27: qty 52

## 2018-02-27 MED ORDER — SODIUM CHLORIDE 0.9% FLUSH
10.0000 mL | INTRAVENOUS | Status: DC | PRN
  Administered 2018-02-27: 10 mL
  Filled 2018-02-27: qty 10

## 2018-02-27 MED ORDER — SODIUM CHLORIDE 0.9 % IV SOLN
Freq: Once | INTRAVENOUS | Status: AC
  Administered 2018-02-27: 09:00:00 via INTRAVENOUS
  Filled 2018-02-27: qty 250

## 2018-02-27 MED ORDER — PALONOSETRON HCL INJECTION 0.25 MG/5ML
0.2500 mg | Freq: Once | INTRAVENOUS | Status: AC
  Administered 2018-02-27: 0.25 mg via INTRAVENOUS

## 2018-02-27 MED ORDER — HEPARIN SOD (PORK) LOCK FLUSH 100 UNIT/ML IV SOLN
500.0000 [IU] | Freq: Once | INTRAVENOUS | Status: AC | PRN
  Administered 2018-02-27: 500 [IU]
  Filled 2018-02-27: qty 5

## 2018-02-27 MED ORDER — SODIUM CHLORIDE 0.9 % IV SOLN
100.0000 mg/m2 | Freq: Once | INTRAVENOUS | Status: AC
  Administered 2018-02-27: 260 mg via INTRAVENOUS
  Filled 2018-02-27: qty 13

## 2018-02-27 NOTE — Patient Instructions (Signed)
[facility] Discharge Instructions for Patients Receiving Chemotherapy  Today you received the following chemotherapy agents: Etoposide (Vepesid) and Cisplatin (Platinol)  To help prevent nausea and vomiting after your treatment, we encourage you to take your nausea medication as directed.    If you develop nausea and vomiting that is not controlled by your nausea medication, call the clinic.   BELOW ARE SYMPTOMS THAT SHOULD BE REPORTED IMMEDIATELY:  *FEVER GREATER THAN 100.5 F  *CHILLS WITH OR WITHOUT FEVER  NAUSEA AND VOMITING THAT IS NOT CONTROLLED WITH YOUR NAUSEA MEDICATION  *UNUSUAL SHORTNESS OF BREATH  *UNUSUAL BRUISING OR BLEEDING  TENDERNESS IN MOUTH AND THROAT WITH OR WITHOUT PRESENCE OF ULCERS  *URINARY PROBLEMS  *BOWEL PROBLEMS  UNUSUAL RASH Items with * indicate a potential emergency and should be followed up as soon as possible.  Feel free to call the clinic should you have any questions or concerns. The clinic phone number is [phone].  Please show the [facility] at check-in to the Emergency Department and triage nurse.

## 2018-03-02 ENCOUNTER — Inpatient Hospital Stay: Payer: BLUE CROSS/BLUE SHIELD

## 2018-03-02 VITALS — BP 126/66 | HR 55 | Temp 98.0°F | Resp 18

## 2018-03-02 DIAGNOSIS — N400 Enlarged prostate without lower urinary tract symptoms: Secondary | ICD-10-CM | POA: Diagnosis not present

## 2018-03-02 DIAGNOSIS — Z79899 Other long term (current) drug therapy: Secondary | ICD-10-CM | POA: Diagnosis not present

## 2018-03-02 DIAGNOSIS — C6290 Malignant neoplasm of unspecified testis, unspecified whether descended or undescended: Secondary | ICD-10-CM

## 2018-03-02 DIAGNOSIS — Z7189 Other specified counseling: Secondary | ICD-10-CM

## 2018-03-02 DIAGNOSIS — Z7689 Persons encountering health services in other specified circumstances: Secondary | ICD-10-CM | POA: Diagnosis not present

## 2018-03-02 DIAGNOSIS — Z5111 Encounter for antineoplastic chemotherapy: Secondary | ICD-10-CM | POA: Diagnosis not present

## 2018-03-02 DIAGNOSIS — Z7982 Long term (current) use of aspirin: Secondary | ICD-10-CM | POA: Diagnosis not present

## 2018-03-02 DIAGNOSIS — I10 Essential (primary) hypertension: Secondary | ICD-10-CM | POA: Diagnosis not present

## 2018-03-02 DIAGNOSIS — E119 Type 2 diabetes mellitus without complications: Secondary | ICD-10-CM | POA: Diagnosis not present

## 2018-03-02 DIAGNOSIS — E669 Obesity, unspecified: Secondary | ICD-10-CM | POA: Diagnosis not present

## 2018-03-02 MED ORDER — PEGFILGRASTIM-CBQV 6 MG/0.6ML ~~LOC~~ SOSY
PREFILLED_SYRINGE | SUBCUTANEOUS | Status: AC
  Filled 2018-03-02: qty 0.6

## 2018-03-02 MED ORDER — PEGFILGRASTIM-CBQV 6 MG/0.6ML ~~LOC~~ SOSY
6.0000 mg | PREFILLED_SYRINGE | Freq: Once | SUBCUTANEOUS | Status: AC
  Administered 2018-03-02: 6 mg via SUBCUTANEOUS

## 2018-03-03 ENCOUNTER — Encounter: Payer: Self-pay | Admitting: Hematology

## 2018-03-05 ENCOUNTER — Telehealth: Payer: Self-pay

## 2018-03-05 ENCOUNTER — Other Ambulatory Visit: Payer: Self-pay | Admitting: Hematology

## 2018-03-05 NOTE — Telephone Encounter (Signed)
Per 11/21 vm return call. Spoke with [facility]) concerning a follow up appointment. After she spoke with Irene Limbo she then sent a scheduled message to schedule appointments. For 11/26. Called patient and verified time.

## 2018-03-09 NOTE — Progress Notes (Signed)
HEMATOLOGY/ONCOLOGY CLNIC NOTE  Date of Service: 03/10/2018  Patient Care Team: Antony Contras, MD as PCP - General (Family Medicine)  CHIEF COMPLAINTS/PURPOSE OF CONSULTATION:  F/u for extragondal seminoma  HISTORY OF PRESENTING ILLNESS:   Neil Berg is a wonderful 57 y.o. male who has been referred to [place] by Dr. Antony Contras for evaluation and management of Lymphadenopathy. He is accompanied today by his wife. The pt reports that he is doing well overall and [place]   The pt reports that he hasn't had any pain in over a week and recently appeared to [facility] on 10/17/17. He notes that he woke up on the morning of 10/17/17 with significant, unique, back and kidney pain which began to spread around to the RUQ of his abdomen as the morning progressed. He notes that his pain went away after taking pain medications for 5 days, and describes that the pain was waxing and waning. He also endorses some positional exacerbation to his pain. He denies any nausea, vomiting, diarrhea, changes in bowel habits, testicular pain or swelling, difficulty/changes in urination at the time or since then.    The pt also notes that  In the week of these symptoms he felt very cold, which is different for him as he describes himself as very warm natured. He also notes that his appetite weakened and he lost about 10 pounds, and was abiding by the [employer]. He notes that his appetite has returned and he is eating normally again. He denies any pain associated with meals. He denies recent exotic or international travel  He takes Jardiance for his DM. He notes that his PSA spiked two years ago, had a biopsy, and his PSA counts have decreased recently and is believed to be BPH.    He also notes that he had a recurring rash on his calves, worse on the left, that was red and flat, mimicking his sock impression, but extending further up his leg. He notes that this happened  over 3 months, and that it came and went without any discernable pattern. He denies any associated itching.   He also notes a place on the front of his left lower leg that has been slightly swollen for the past a week. He denies remembering running into anything, bruising, or pain.   Of note prior to the patient's visit today, pt has had CT A/P completed on 10/14/17 with results revealing Retroperitoneal lymphadenopathy, highly suspicious for lymphoma or metastatic disease. 2. Prostate enlargement 3.  Aortic Atherosclerosis.   Most recent lab results (10/17/17) of CBC w/diff is as follows: all values are WNL except for WBC at 13.9k, RBC at 5.83, ANC at 10.0k, Monocytes abs at 1.4k.  On review of systems, pt reports recent back pain, feeling cold recently, recent abdominal pain, and denies nausea, vomiting, diarrhea, changes in bowel habits, difficulty/changes in urination, blood in the urine, abdominal trauma, constipation, fevers, night sweats, chills, testicular pain or swelling, noticing any lumps or bumps, unexpected weight loss, and any other symptoms.   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  On Social Hx the pt reports working in [employer] and denies chemical/radiaiton exposure. He denies ever smoking and other drug use.  Interval History:   Neil Berg returns today for management, evaluation after completing 4 cycles of EP  treatment of his Stage II extra-gonadal retroperitoneal Seminoma. The patient's last visit with [place] was on 02/02/18. The pt reports that he is doing well overall.   The pt reports that he has had marginally more fatigue after finishing C4. He denies any changes in hearing, denies ringing in the ears. He also denies leg swelling, noting rather that his leg swelling has resolved.   The pt notes that he has some mild, dull bone pains from  Neulasta.   The pt notes that each morning for the last 4-5 days, when he blows his nose, he sees moist blood clots, and mucous with a red tent, less than a teaspoon. The pt notes that he has had frequent nose bleeds since childhood and received cauterization in his sinuses in childhood. He denies any other concerns for bleeding including gum bleeds, blood in the stools, or blood in the urine.   The pt notes that he has been staying well hydrated and is eating well. He does endorse some changed sense of taste, which he notes persists for about a week or so after each cycle and then resolves on its own.   On review of systems, pt reports some fatigue, mild bone pains, staying hydrated, eating well, and denies nausea, vomiting, diarrhea, mouth sores, leg swelling, changes in hearing, fevers, chills, night sweats, problems with urination, ringing in the ears, specific back pain, abdominal pains, concerns for infection, and any other symptoms.   MEDICAL HISTORY:  Past Medical History:  Diagnosis Date  . Chest pain   . Diabetes mellitus without complication ([place])   . Hypertension   . Obesity   Type II Diabetes Mellitus   SURGICAL HISTORY: Past Surgical History:  Procedure Laterality Date  . EYE SURGERY     left eye cataract removal / implant   . IR IMAGING GUIDED PORT INSERTION  12/11/2017  . TONSILLECTOMY      Tonsillectomy 1974 Cataract surgery-left eye 04/2015 Colonoscopy 06/04/16  SOCIAL HISTORY: Social History   Socioeconomic History  . Marital status: Married    Spouse name: Not on file  . Number of children: Not on file  . Years of education: Not on file  . Highest education level: Not on file  Occupational History  . Not on file  Social Needs  . Financial resource strain: Not on file  . Food insecurity:    Worry: Not on file    Inability: Not on file  . Transportation needs:    Medical: Not on file    Non-medical: Not on file  Tobacco Use  . Smoking status: Never  Smoker  . Smokeless tobacco: Never Used  Substance and Sexual Activity  . Alcohol use: No  . Drug use: No  . Sexual activity: Not on file  Lifestyle  . Physical activity:    Days per week: Not on file    Minutes per session: Not on file  . Stress: Not on file  Relationships  . Social connections:    Talks on phone: Not on file    Gets together: Not on file    Attends religious service: Not on file    Active member of club or organization: Not on file    Attends meetings of clubs or organizations: Not on file    Relationship status: Not on file  . Intimate partner violence:    Fear of current or ex partner: Not on file    Emotionally abused: Not on file    Physically abused: Not  on file    Forced sexual activity: Not on file  Other Topics Concern  . Not on file  Social History Narrative  . Not on file    FAMILY HISTORY: Family History  Problem Relation Age of Onset  . Heart disease Maternal Grandmother   . Heart attack Maternal Grandfather     ALLERGIES:  has No Known Allergies.  MEDICATIONS:  Current Outpatient Medications  Medication Sig Dispense Refill  . amLODipine (NORVASC) 5 MG tablet Take 1 tablet (5 mg total) by mouth daily. 30 tablet 11  . amoxicillin-clavulanate (AUGMENTIN) 875-125 MG tablet Take 1 tablet by mouth 2 (two) times daily. 20 tablet 0  . aspirin EC 81 MG tablet Take 81 mg by mouth daily.    Marland Kitchen b complex vitamins tablet Take 1 tablet by mouth daily.    Marland Kitchen dexamethasone (DECADRON) 4 MG tablet Take 2 tablets by mouth once a day on the day starting on day 6 of chemotherapy and then take 2 tablets two times a day for 2 days. 30 tablet 1  . HYDROcodone-acetaminophen (NORCO/VICODIN) 5-325 MG tablet Take 1 tablet by mouth every 6 (six) hours as needed for moderate pain or severe pain. 30 tablet 0  . JARDIANCE 25 MG TABS tablet TK 1 T PO QAM  0  . lidocaine-prilocaine (EMLA) cream Apply to affected area once 30 g 3  . lisinopril (PRINIVIL,ZESTRIL) 20 MG  tablet TAKE 1 TABLET BY MOUTH EVERY DAY. DISCONTINUE LISINOPRIL/HCTZ COMBINATION 30 tablet 0  . LORazepam (ATIVAN) 0.5 MG tablet Take 1 tablet (0.5 mg total) by mouth every 6 (six) hours as needed (Nausea or vomiting). 30 tablet 0  . metoCLOPramide (REGLAN) 10 MG tablet Take 1 tablet (10 mg total) by mouth every 8 (eight) hours as needed (nausea and hiccups). 20 tablet 0  . ondansetron (ZOFRAN) 8 MG tablet Take 1 tablet (8 mg total) by mouth 2 (two) times daily as needed. Start on day 8 of chemotherapy. 30 tablet 1  . pantoprazole (PROTONIX) 40 MG tablet TAKE 1 TABLET(40 MG) BY MOUTH DAILY 30 tablet 0  . prochlorperazine (COMPAZINE) 10 MG tablet Take 1 tablet (10 mg total) by mouth every 6 (six) hours as needed (Nausea or vomiting). 30 tablet 1   No current facility-administered medications for this visit.     REVIEW OF SYSTEMS:    A 10+ POINT REVIEW OF SYSTEMS WAS OBTAINED including neurology, dermatology, psychiatry, cardiac, respiratory, lymph, extremities, GI, GU, Musculoskeletal, constitutional, breasts, reproductive, HEENT.  All pertinent positives are noted in the HPI.  All others are negative.   PHYSICAL EXAMINATION: ECOG PERFORMANCE STATUS: 1 - Symptomatic but completely ambulatory  VS reviewed  GENERAL:alert, in no acute distress and comfortable SKIN: no acute rashes, no significant lesions EYES: conjunctiva are pink and non-injected, sclera anicteric OROPHARYNX: MMM, no exudates, no oropharyngeal erythema or ulceration NECK: supple, no JVD LYMPH:  no palpable lymphadenopathy in the cervical, axillary or inguinal regions LUNGS: clear to auscultation b/l with normal respiratory effort HEART: regular rate & rhythm ABDOMEN:  normoactive bowel sounds , non tender, not distended. No palpable hepatosplenomegaly.  TESTES: no obvious palpable testicular mass Extremity: no pedal edema PSYCH: alert & oriented x 3 with fluent speech NEURO: no focal motor/sensory deficits   LABORATORY  DATA:  I have reviewed the data as listed  . CBC Latest Ref Rng & Units 03/10/2018 02/23/2018 02/02/2018  WBC 4.0 - 10.5 K/uL 8.7 13.1(H) 14.5(H)  Hemoglobin 13.0 - 17.0 g/dL 12.1(L) 12.5(L) 13.2  Hematocrit 39.0 - 52.0 % 36.6(L) 38.3(L) 39.7  Platelets 150 - 400 K/uL 106(L) 200 212  ANC 300  . CMP Latest Ref Rng & Units 03/10/2018 02/23/2018 02/02/2018  Glucose 70 - 99 mg/dL 232(H) 302(H) 240(H)  BUN 6 - 20 mg/dL 16 12 19   Creatinine 0.61 - 1.24 mg/dL 0.74 0.78 0.81  Sodium 135 - 145 mmol/L 137 135 134(L)  Potassium 3.5 - 5.1 mmol/L 4.3 5.0 4.4  Chloride 98 - 111 mmol/L 103 98 99  CO2 22 - 32 mmol/L 24 27 21(L)  Calcium 8.9 - 10.3 mg/dL 8.7(L) 9.1 9.1  Total Protein 6.5 - 8.1 g/dL 6.2(L) 6.4(L) 6.8  Total Bilirubin 0.3 - 1.2 mg/dL 0.3 0.5 0.3  Alkaline Phos 38 - 126 U/L 155(H) 144(H) 137(H)  AST 15 - 41 U/L 15 16 15   ALT 0 - 44 U/L 23 35 29    Pathology:   11/25/17 Tissue Flow Cytometry:    RADIOGRAPHIC STUDIES: I have personally reviewed the radiological images as listed and agreed with the findings in the report. No results found.  ASSESSMENT & PLAN:   57 y.o. male with  1. 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 Prostate enlargement.  11/17/17 PET/CT revealed Isolated hypermetabolic retroperitoneal lymphadenopathy. Findings suspicious for lymphoma as no primary lesion is identified to suggest this is metastatic adenopathy. Recommend retroperitoneal biopsy. The largest left-sided node appears necrotic and is actually decreased in size since the prior CT scan. It appeared inflamed on the prior study and may have infarcted. I would not recommend biopsying this lesion. No adenopathy in the neck, axilla, chest, pelvis or inguinal regions. No  worrisome pulmonary lesions or osseous lesions.  11/25/17 pathology results revealed  finding of metastatic seminoma    12/04/17 US Scrotum w/doppler revealed Negative scrotal ultrasound with no distinct testicular mass Identified. 2. 5 mm simple left epididymal cyst, almost certainly benign and felt to be incidental in nature.   2. Chemotherapy related neutropenia -- resolved.  PLAN:  -Discussed pt labwork today, 03/10/18 -Pt overall tolerated 4 cycles of EP well  -Recommend distilled water inhalation, using a humidifier at night when the heat is on, and applying petroleum jelly in the nares to prevent nose bleeds.  -Will refer the pt to PT, which he prefers -Recommended that the pt continue to eat well, drink at least 48-64 oz of water each day, and walk 20-30 minutes each day.  -Will order repeat PET/CT  -Will see the pt back in 3 weeks    Additional labs today Cancer rehab referral urgently PET/CT in 15 days RTC with Dr Irene Limbo in 3 weeks with labs   All of the patients questions were answered with apparent satisfaction. The patient knows to call the clinic with any problems, questions or concerns.  The total time spent in the appt was 30 minutes and more than 50% was on counseling and direct patient cares.     Sullivan Lone MD MS AAHIVMS [facility] Hematology/Oncology Physician [facility]  (Office):       [phone] (Work cell):  [phone] (Fax):           [phone]  03/10/2018 9:16 AM  I, Baldwin Jamaica, am acting as a scribe for Dr. Sullivan Lone.   .I have reviewed the above documentation for accuracy and completeness,  and I agree with the above. Brunetta Genera MD

## 2018-03-10 ENCOUNTER — Encounter: Payer: Self-pay | Admitting: Hematology

## 2018-03-10 ENCOUNTER — Inpatient Hospital Stay (HOSPITAL_BASED_OUTPATIENT_CLINIC_OR_DEPARTMENT_OTHER): Payer: BLUE CROSS/BLUE SHIELD | Admitting: Hematology

## 2018-03-10 ENCOUNTER — Inpatient Hospital Stay: Payer: BLUE CROSS/BLUE SHIELD

## 2018-03-10 VITALS — BP 132/79 | HR 101 | Temp 98.7°F | Resp 18 | Ht 72.0 in | Wt 280.6 lb

## 2018-03-10 DIAGNOSIS — Z7982 Long term (current) use of aspirin: Secondary | ICD-10-CM | POA: Diagnosis not present

## 2018-03-10 DIAGNOSIS — C6290 Malignant neoplasm of unspecified testis, unspecified whether descended or undescended: Secondary | ICD-10-CM | POA: Diagnosis not present

## 2018-03-10 DIAGNOSIS — Z79899 Other long term (current) drug therapy: Secondary | ICD-10-CM

## 2018-03-10 DIAGNOSIS — Z5111 Encounter for antineoplastic chemotherapy: Secondary | ICD-10-CM | POA: Diagnosis not present

## 2018-03-10 DIAGNOSIS — I10 Essential (primary) hypertension: Secondary | ICD-10-CM | POA: Diagnosis not present

## 2018-03-10 DIAGNOSIS — N400 Enlarged prostate without lower urinary tract symptoms: Secondary | ICD-10-CM | POA: Diagnosis not present

## 2018-03-10 DIAGNOSIS — E119 Type 2 diabetes mellitus without complications: Secondary | ICD-10-CM | POA: Diagnosis not present

## 2018-03-10 DIAGNOSIS — E669 Obesity, unspecified: Secondary | ICD-10-CM | POA: Diagnosis not present

## 2018-03-10 DIAGNOSIS — Z7689 Persons encountering health services in other specified circumstances: Secondary | ICD-10-CM | POA: Diagnosis not present

## 2018-03-10 LAB — CBC WITH DIFFERENTIAL/PLATELET
ABS IMMATURE GRANULOCYTES: 1.02 10*3/uL — AB (ref 0.00–0.07)
BASOS PCT: 1 %
Basophils Absolute: 0.1 10*3/uL (ref 0.0–0.1)
Eosinophils Absolute: 0 10*3/uL (ref 0.0–0.5)
Eosinophils Relative: 0 %
HCT: 36.6 % — ABNORMAL LOW (ref 39.0–52.0)
HEMOGLOBIN: 12.1 g/dL — AB (ref 13.0–17.0)
IMMATURE GRANULOCYTES: 12 %
Lymphocytes Relative: 30 %
Lymphs Abs: 2.6 10*3/uL (ref 0.7–4.0)
MCH: 28.7 pg (ref 26.0–34.0)
MCHC: 33.1 g/dL (ref 30.0–36.0)
MCV: 86.7 fL (ref 80.0–100.0)
MONO ABS: 1 10*3/uL (ref 0.1–1.0)
MONOS PCT: 11 %
NEUTROS ABS: 4 10*3/uL (ref 1.7–7.7)
Neutrophils Relative %: 46 %
Platelets: 106 10*3/uL — ABNORMAL LOW (ref 150–400)
RBC: 4.22 MIL/uL (ref 4.22–5.81)
RDW: 17 % — AB (ref 11.5–15.5)
WBC: 8.7 10*3/uL (ref 4.0–10.5)
nRBC: 1.4 % — ABNORMAL HIGH (ref 0.0–0.2)

## 2018-03-10 LAB — COMPREHENSIVE METABOLIC PANEL
ALBUMIN: 3.4 g/dL — AB (ref 3.5–5.0)
ALK PHOS: 155 U/L — AB (ref 38–126)
ALT: 23 U/L (ref 0–44)
AST: 15 U/L (ref 15–41)
Anion gap: 10 (ref 5–15)
BILIRUBIN TOTAL: 0.3 mg/dL (ref 0.3–1.2)
BUN: 16 mg/dL (ref 6–20)
CO2: 24 mmol/L (ref 22–32)
Calcium: 8.7 mg/dL — ABNORMAL LOW (ref 8.9–10.3)
Chloride: 103 mmol/L (ref 98–111)
Creatinine, Ser: 0.74 mg/dL (ref 0.61–1.24)
GFR calc Af Amer: >60 mL/min (ref >60)
GFR calc non Af Amer: >60 mL/min (ref >60)
GLUCOSE: 232 mg/dL — AB (ref 70–99)
POTASSIUM: 4.3 mmol/L (ref 3.5–5.1)
Sodium: 137 mmol/L (ref 135–145)
TOTAL PROTEIN: 6.2 g/dL — AB (ref 6.5–8.1)

## 2018-03-10 LAB — MAGNESIUM: Magnesium: 1.3 mg/dL — CL (ref 1.7–2.4)

## 2018-03-10 NOTE — Patient Instructions (Signed)
Thank you for choosing [facility] to provide your oncology and hematology care.  To afford each patient quality time with our providers, please arrive 30 minutes before your scheduled appointment time.  If you arrive late for your appointment, you may be asked to reschedule.  We strive to give you quality time with our providers, and arriving late affects you and other patients whose appointments are after yours.    If you are a no show for multiple scheduled visits, you may be dismissed from the clinic at the providers discretion.     Again, thank you for choosing [facility], our hope is that these requests will decrease the amount of time that you wait before being seen by our physicians.  ______________________________________________________________________   Should you have questions after your visit to the [facility], please contact our office at [phone] between the hours of 8:30 and 4:30 p.m.    Voicemails left after 4:30p.m will not be returned until the following business day.     For prescription refill requests, please have your pharmacy contact us directly.  Please also try to allow 48 hours for prescription requests.     Please contact the scheduling department for questions regarding scheduling.  For scheduling of procedures such as PET scans, CT scans, MRI, Ultrasound, etc please contact central scheduling at [phone].     Resources For Cancer Patients and Caregivers:    Oncolink.org:  A wonderful resource for patients and healthcare providers for information regarding your disease, ways to tract your treatment, what to expect, etc.      American Cancer Society:  800-227-2345  Can help patients locate various types of support and financial assistance   Cancer Care: 1-800-813-HOPE (4673) Provides financial assistance, online support groups, medication/co-pay assistance.     [place] DSS:  336-641-3447 Where to apply  for food stamps, Medicaid, and utility assistance   Medicare Rights Center: 800-333-4114 Helps people with Medicare understand their rights and benefits, navigate the Medicare system, and secure the quality healthcare they deserve   SCAT: 336-333-6589 [place] [employer]'s shared-ride transportation service for eligible riders who have a disability that prevents them from riding the fixed route bus.     For additional information on assistance programs please contact our social worker:   Abigail Elmore:  [phone]  

## 2018-03-11 ENCOUNTER — Telehealth: Payer: Self-pay

## 2018-03-11 NOTE — Telephone Encounter (Signed)
Spoke with patient and he is aware of his upcoming apppointments. Mailed a letter with a calender enclosed. Per 11/26 los

## 2018-03-13 ENCOUNTER — Other Ambulatory Visit: Payer: Self-pay | Admitting: Hematology

## 2018-03-17 ENCOUNTER — Other Ambulatory Visit: Payer: Self-pay | Admitting: Hematology

## 2018-03-17 DIAGNOSIS — C6290 Malignant neoplasm of unspecified testis, unspecified whether descended or undescended: Secondary | ICD-10-CM

## 2018-03-18 ENCOUNTER — Other Ambulatory Visit: Payer: Self-pay

## 2018-03-18 ENCOUNTER — Encounter: Payer: Self-pay | Admitting: Rehabilitation

## 2018-03-18 ENCOUNTER — Ambulatory Visit: Payer: BLUE CROSS/BLUE SHIELD | Attending: Hematology | Admitting: Rehabilitation

## 2018-03-18 DIAGNOSIS — C6290 Malignant neoplasm of unspecified testis, unspecified whether descended or undescended: Secondary | ICD-10-CM | POA: Diagnosis not present

## 2018-03-18 DIAGNOSIS — M6281 Muscle weakness (generalized): Secondary | ICD-10-CM | POA: Diagnosis not present

## 2018-03-18 NOTE — Therapy (Signed)
[place], [place], [address] Phone: [phone]   Fax:  [phone]  Physical Therapy Evaluation  Patient Details  Name: Neil Berg MRN: [id number] Date of Birth: 12/08/1960 Referring Provider (PT): Dr. Irene Limbo   Encounter Date: 03/18/2018  PT End of Session - 03/18/18 1221    Visit Number  1    Number of Visits  8    Date for PT Re-Evaluation  04/22/18    PT Start Time  0845    PT Stop Time  0930    PT Time Calculation (min)  45 min    Activity Tolerance  Patient tolerated treatment well    Behavior During Therapy  [facility] for tasks assessed/performed       Past Medical History:  Diagnosis Date  . Chest pain   . Diabetes mellitus without complication ([place])   . Hypertension   . Obesity     Past Surgical History:  Procedure Laterality Date  . EYE SURGERY     left eye cataract removal / implant   . IR IMAGING GUIDED PORT INSERTION  12/11/2017  . TONSILLECTOMY      There were no vitals filed for this visit.   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  Pt will have a PET scan next week to see if any other treatment is needed but hoping not.  no other significant medical history    Limitations  --   up steps after working    Diagnostic tests  has PET scan next week to see about next steps     Patient Stated Goals  decrease fatigue, avoid more falls, get back to work , strength back in the legs     Currently in Pain?  No/denies         [facility] PT Assessment - 03/18/18 0001      Assessment   Medical Diagnosis  seminoma with retroperiteneal location    Referring Provider (PT)  Dr. Irene Limbo    Onset Date/Surgical Date  10/14/17    Hand Dominance  Right    Next MD Visit  next week    Prior Therapy  no      Precautions   Precaution Comments  cancer       Restrictions   Weight Bearing Restrictions  No    Other Position/Activity Restrictions  n      Balance Screen   Has the patient fallen in the past 6 months  Yes    How many times?  2    Has the patient had a decrease in activity level because of a fear of falling?   Yes    Is the patient reluctant to leave their home because of a fear of falling?   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 not released to work yet maybe after holidays    Leisure  yard work; has taken double time  Cognition   Overall Cognitive Status  Within Functional Limits for tasks assessed      Observation/Other Assessments   Observations  WNL      Coordination   Gross Motor Movements are Fluid and Coordinated  Yes      Functional Tests   Functional tests  Sit to Stand;Step up      Step Up   Comments  6" step WNL bil      Sit to Stand   Comments  x12  in 30 seconds on 21" table       ROM / Strength   AROM / PROM / Strength  Strength      Strength   Strength Assessment Site  Knee;Hip    Right/Left Hip  Right;Left    Right Hip Flexion  4-/5    Right Hip External Rotation   4-/5    Right Hip Internal Rotation  4/5    Right Hip ABduction  5/5    Left Hip Flexion  4-/5    Left Hip External Rotation  4-/5    Left Hip Internal Rotation  4/5    Left Hip ABduction  5/5    Right/Left Knee  Right;Left    Right Knee Flexion  4+/5    Right Knee Extension  4-/5    Left Knee Flexion  4+/5    Left Knee Extension  4-/5      Flexibility   Soft Tissue Assessment /Muscle Length  yes    Hamstrings  to 40 bil     Piriformis  at 25%      Balance   Balance Assessed  Yes      Standardized Balance  Assessment   Standardized Balance Assessment  Berg Balance Test      Berg Balance Test   Sit to Stand  Able to stand without using hands and stabilize independently    Standing Unsupported  Able to stand safely 2 minutes    Sitting with Back Unsupported but Feet Supported on Floor or Stool  Able to sit safely and securely 2 minutes    Stand to Sit  Sits safely with minimal use of hands    Transfers  Able to transfer safely, minor use of hands    Standing Unsupported with Eyes Closed  Able to stand 10 seconds with supervision    Standing Ubsupported with Feet Together  Able to place feet together independently and stand 1 minute safely    From Standing, Reach Forward with Outstretched Arm  Can reach confidently >25 cm (10")    From Standing Position, Pick up Object from Floor  Able to pick up shoe safely and easily    From Standing Position, Turn to Look Behind Over each Shoulder  Looks behind from both sides and weight shifts well    Turn 360 Degrees  Able to turn 360 degrees safely in 4 seconds or less    Standing Unsupported, Alternately Place Feet on Step/Stool  Able to stand independently and safely and complete 8 steps in 20 seconds    Standing Unsupported, One Foot in Front  Able to plae foot ahead of the other independently and hold 30 seconds    Standing on One Leg  Able to lift leg independently and hold 5-10 seconds    Total Score  53    Berg comment:  low risk of falling         LYMPHEDEMA/ONCOLOGY QUESTIONNAIRE - 03/18/18 0850      Type  Cancer Type  stage II seminoma in the retroperiteneum      Surgeries   Other Surgery Date  --   port placement      Treatment   Active Chemotherapy Treatment  No    Past Chemotherapy Treatment  Yes    Date  02/23/18    Active Radiation Treatment  No    Past Radiation Treatment  No    Current Hormone Treatment  No    Past Hormone Therapy  No      What other symptoms do you have   Are you Having Heaviness or Tightness  No    Are  you having Pain  No             Objective measurements completed on examination: See above findings.      [facility] Adult PT Treatment/Exercise - 03/18/18 0001      Exercises   Exercises  Other Exercises    Other Exercises   discussed walking program at home with encouragement up to 2min 5 days per week and sit to stand at home               PT Education - 03/18/18 1221    Education Details  walking, POC    Person(s) Educated  Patient    Methods  Explanation;Handout    Comprehension  Verbalized understanding          PT Long Term Goals - 03/18/18 1227      PT LONG TERM GOAL #1   Title  Pt will perform sit to stand x 15 in 30 seconds to match or beat the norm for his age group    Baseline  12 (21" table)    Time  5    Period  Weeks    Status  New    Target Date  04/22/17      PT LONG TERM GOAL #2   Title  Pt will improve knee ext MMT to 5/5 bilaterally     Time  5    Period  Weeks    Status  New    Target Date  04/22/17      PT LONG TERM GOAL #3   Title  Pt will report no difficulty walking up the stairs to his home after performing work activities    Time  5    Period  Weeks    Status  New    Target Date  04/22/17      PT LONG TERM GOAL #4   Title  Pt will be ind with final HEP or gym program     Time  5    Period  Weeks    Status  New    Target Date  04/22/17             Plan - 03/18/18 1223    Clinical Impression Statement  Neil Berg presents post chemotherapy with fatigue, LE weakness, and decreased work tolerance.  Pt is overall doing well but is experiencing quad and hip weakness, decreased endurance with stairs and working activities as a Regulatory affairs officer.  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  Berg balance score was good at a low fall risk.  He also has signficant tightness in bilateral LEs at this time.      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 Pt will have a PET scan next week to see if any other treatment is needed but hoping not. no other significant medical history    Clinical Presentation  Stable    Clinical Presentation due to:  completion of treatment    Clinical Decision Making  Moderate    Rehab Potential  Excellent    PT Frequency  2x / week    PT Duration  4 weeks   starting next week    PT Treatment/Interventions  Therapeutic activities;Therapeutic exercise;Patient/family education    PT Next Visit Plan  begin general LE strength and endurance, quad focus, hamstring and piriformis stretching, bike or nustep.  can incoporate some standing/balance    PT Home Exercise Plan  walking, sit to stand     Consulted and Agree with Plan of Care  Patient       Patient will benefit from skilled therapeutic intervention in order to improve the following deficits and impairments:  Decreased activity tolerance, Decreased endurance, Decreased strength, Impaired flexibility  Visit Diagnosis: Seminoma ([place])  Muscle weakness (generalized)     Problem List Patient Active Problem List   Diagnosis Date Noted  . Counseling regarding advance care planning and goals of care 01/12/2018  . Seminoma ([place]) 12/10/2017    Shan Levans, PT 03/18/2018, 12:30 PM  [place], [place], [address] Phone: [phone]   Fax:  [phone]  Name: Neil Berg MRN: [id number] Date of Birth: 06-20-1960

## 2018-03-18 NOTE — Patient Instructions (Signed)
WALKING  Walking is a great form of exercise to increase your strength, endurance and overall fitness.  A walking program can help you start slowly and gradually build endurance as you go.  Everyone's ability is different, so each person's starting point will be different.  You do not have to follow them exactly.  The are just samples. You should simply find out what's right for you and stick to that program.   In the beginning, you'll start off walking 2-3 times a day for short distances.  As you get stronger, you'll be walking further at just 1-2 times per day.  A. You Can Walk For A Certain Length Of Time Each Day    Walk 5 minutes 3 times per day.  Increase 2 minutes every 2 days (3 times per day).  Work up to 25-30 minutes (1-2 times per day).   Example:   Day 1-2 5 minutes 3 times per day   Day 7-8 12 minutes 2-3 times per day   Day 13-14 25 minutes 1-2 times per day    Sit to stand without hands at least 15 times; 1-2 times per day.

## 2018-03-20 DIAGNOSIS — H40013 Open angle with borderline findings, low risk, bilateral: Secondary | ICD-10-CM | POA: Diagnosis not present

## 2018-03-23 ENCOUNTER — Telehealth: Payer: Self-pay | Admitting: *Deleted

## 2018-03-23 NOTE — Telephone Encounter (Signed)
Patient called: Was scheduled for PET scan 12/10 and was notified by insurance on Friday evening that it was denied. What does he need to do now?

## 2018-03-23 NOTE — Telephone Encounter (Signed)
Contacted patient to let him know that care management will be contacted regarding an appeal for the denial.Patient verbalized understanding.

## 2018-03-24 ENCOUNTER — Other Ambulatory Visit: Payer: Self-pay | Admitting: Hematology

## 2018-03-24 ENCOUNTER — Ambulatory Visit (HOSPITAL_COMMUNITY): Payer: BLUE CROSS/BLUE SHIELD

## 2018-03-25 ENCOUNTER — Ambulatory Visit: Payer: BLUE CROSS/BLUE SHIELD

## 2018-03-25 ENCOUNTER — Telehealth: Payer: Self-pay | Admitting: *Deleted

## 2018-03-25 DIAGNOSIS — M6281 Muscle weakness (generalized): Secondary | ICD-10-CM

## 2018-03-25 DIAGNOSIS — C6290 Malignant neoplasm of unspecified testis, unspecified whether descended or undescended: Secondary | ICD-10-CM | POA: Diagnosis not present

## 2018-03-25 NOTE — Therapy (Signed)
[place], [place], [address] Phone: [phone]   Fax:  [phone]  Physical Therapy Treatment  Patient Details  Name: Neil Berg MRN: [id number] Date of Birth: 1960/11/18 Referring Provider (PT): Dr. Irene Limbo   Encounter Date: 03/25/2018  PT End of Session - 03/25/18 0814    Visit Number  2    Number of Visits  8    Date for PT Re-Evaluation  04/22/18    PT Start Time  0802    PT Stop Time  0846    PT Time Calculation (min)  44 min    Activity Tolerance  Patient tolerated treatment well    Behavior During Therapy  [facility] for tasks assessed/performed       Past Medical History:  Diagnosis Date  . Chest pain   . Diabetes mellitus without complication ([place])   . Hypertension   . Obesity     Past Surgical History:  Procedure Laterality Date  . EYE SURGERY     left eye cataract removal / implant   . IR IMAGING GUIDED PORT INSERTION  12/11/2017  . TONSILLECTOMY      There were no vitals filed for this visit.  Subjective Assessment - 03/25/18 0806    Subjective  I've been trying to incorporate walking more when the weather allows. I've walked about 3x 20-25 mins each time and handled that pretty good.     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  Pt will have a PET scan next week to see if any other treatment is needed but hoping not.  no other significant medical history    Patient Stated Goals  decrease fatigue, avoid more falls, get back to work , strength back in the legs     Currently in Pain?  Yes    Pain Score  2     Pain Location  Back    Pain Orientation  Right    Pain Descriptors / Indicators  Dull    Pain Type  Acute pain    Aggravating Factors   pain just always there    Pain Relieving Factors  it's just there                       [facility] Adult PT Treatment/Exercise - 03/25/18 0001      Lumbar  Exercises: Stretches   Passive Hamstring Stretch  Right;Left;2 reps;20 seconds    Hip Flexor Stretch  Right;Left;2 reps;20 seconds   foot in chair   Piriformis Stretch  Right;Left;2 reps;20 seconds   seated edge of chair   Gastroc Stretch  Right;Left;2 reps;20 seconds   using prostretch (bil) at back of bike     Lumbar Exercises: Supine   Bridge  10 reps;5 seconds    Bridge with [employer]  10 reps;5 seconds   using purple ball     Knee/Hip Exercises: Aerobic   Nustep  Level 6, x6 mins with PTA present monitoring pt for fatigue      Knee/Hip Exercises: Machines for Strengthening   Cybex Knee Extension  35#, 2 x 10    Cybex Leg Press  95#, 2x10, then cal press 95#, x20      Knee/Hip Exercises: Standing   Wall Squat  10 reps;5 seconds   towel at back for better ease with sliding                 PT  Long Term Goals - 03/18/18 1227      PT LONG TERM GOAL #1   Title  Pt will perform sit to stand x 15 in 30 seconds to match or beat the norm for his age group    Baseline  12 (21" table)    Time  5    Period  Weeks    Status  New    Target Date  04/22/17      PT LONG TERM GOAL #2   Title  Pt will improve knee ext MMT to 5/5 bilaterally     Time  5    Period  Weeks    Status  New    Target Date  04/22/17      PT LONG TERM GOAL #3   Title  Pt will report no difficulty walking up the stairs to his home after performing work activities    Time  5    Period  Weeks    Status  New    Target Date  04/22/17      PT LONG TERM GOAL #4   Title  Pt will be ind with final HEP or gym program     Time  5    Period  Weeks    Status  New    Target Date  04/22/17            Plan - 03/25/18 0815    Clinical Impression Statement  Today was first day of pt exercsiing with weights and progessing HEP. He reports being challenged by this but seemed to tolerate all well. Encouraged him to continue progressing to daily walking routine as able, though weather is part of  hindrance.     Rehab Potential  Excellent    PT Frequency  2x / week    PT Duration  4 weeks    PT Treatment/Interventions  Therapeutic activities;Therapeutic exercise;Patient/family education    PT Next Visit Plan  Assess how he tolerated first session; Cont general LE strength and endurance, quad focus, hamstring and piriformis stretching, bike or nustep.  can incoporate some standing/balance    Consulted and Agree with Plan of Care  Patient       Patient will benefit from skilled therapeutic intervention in order to improve the following deficits and impairments:  Decreased activity tolerance, Decreased endurance, Decreased strength, Impaired flexibility  Visit Diagnosis: Seminoma ([place])  Muscle weakness (generalized)     Problem List Patient Active Problem List   Diagnosis Date Noted  . Counseling regarding advance care planning and goals of care 01/12/2018  . Seminoma ([place]) 12/10/2017    Otelia Limes, PTA 03/25/2018, 8:48 AM  [place], [place], [address] Phone: [phone]   Fax:  [phone]  Name: Neil Berg MRN: [id number] Date of Birth: November 27, 1960

## 2018-03-25 NOTE — Telephone Encounter (Signed)
Spoke with patient to advise that insurance is being given additional information by MD in attempt to secure authorization for PET scan. Per Dr. Irene Limbo: let patient know that if PET not authorized, will proceed with CT scan. Patient verbalied understanding.

## 2018-03-27 ENCOUNTER — Encounter: Payer: Self-pay | Admitting: Rehabilitation

## 2018-03-27 ENCOUNTER — Ambulatory Visit: Payer: BLUE CROSS/BLUE SHIELD | Admitting: Rehabilitation

## 2018-03-27 DIAGNOSIS — M6281 Muscle weakness (generalized): Secondary | ICD-10-CM | POA: Diagnosis not present

## 2018-03-27 DIAGNOSIS — C6290 Malignant neoplasm of unspecified testis, unspecified whether descended or undescended: Secondary | ICD-10-CM

## 2018-03-27 NOTE — Therapy (Signed)
[place], [place], [address] Phone: [phone]   Fax:  [phone]  Physical Therapy Treatment  Patient Details  Name: Kier Smead MRN: [id number] Date of Birth: 1960/09/27 Referring Provider (PT): Dr. Irene Limbo   Encounter Date: 03/27/2018  PT End of Session - 03/27/18 0900    Visit Number  3    Number of Visits  8    Date for PT Re-Evaluation  04/22/18    PT Start Time  0263    PT Stop Time  0930    PT Time Calculation (min)  43 min    Activity Tolerance  Patient tolerated treatment well    Behavior During Therapy  [facility] for tasks assessed/performed       Past Medical History:  Diagnosis Date  . Chest pain   . Diabetes mellitus without complication ([place])   . Hypertension   . Obesity     Past Surgical History:  Procedure Laterality Date  . EYE SURGERY     left eye cataract removal / implant   . IR IMAGING GUIDED PORT INSERTION  12/11/2017  . TONSILLECTOMY      There were no vitals filed for this visit.  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  Pt will have a PET scan next week to see if any other treatment is needed but hoping not.  no other significant medical history    Patient Stated Goals  decrease fatigue, avoid more falls, get back to work , strength back in the legs     Currently in Pain?  No/denies                       [facility] Adult PT Treatment/Exercise - 03/27/18 0001      Exercises   Exercises  Knee/Hip      Lumbar Exercises: Stretches   Passive Hamstring Stretch  Right;Left;1 rep;30 seconds    Passive Hamstring Stretch Limitations  with strap    Quad Stretch  Right;Left;1 rep;20 seconds    Quad Stretch Limitations  with strap prone       Knee/Hip Exercises: Aerobic   Nustep  Level 6, x8 mins with PT present  monitoring pt for fatigue      Knee/Hip Exercises: Machines for Strengthening   Cybex Knee Extension  35# 3x10     Cybex Leg Press  95#, 3x10, then cal press 95#, x20      Knee/Hip Exercises: Standing   Other Standing Knee Exercises  trx squats x 15, green band side steps x 10 R/L x 3, monster steps x 10 x 3                   PT Long Term Goals - 03/18/18 1227      PT LONG TERM GOAL #1   Title  Pt will perform sit to stand x 15 in 30 seconds to match or beat the norm for his age group    Baseline  12 (21" table)    Time  5    Period  Weeks    Status  New    Target Date  04/22/17      PT LONG TERM GOAL #2   Title  Pt will improve knee ext MMT to 5/5 bilaterally     Time  5    Period  Weeks    Status  New    Target Date  04/22/17      PT LONG TERM GOAL #3   Title  Pt will report no difficulty walking up the stairs to his home after performing work activities    Time  5    Period  Weeks    Status  New    Target Date  04/22/17      PT LONG TERM GOAL #4   Title  Pt will be ind with final HEP or gym program     Time  5    Period  Weeks    Status  New    Target Date  04/22/17            Plan - 03/27/18 0903    Clinical Impression Statement  Pt tolerated first session well and today continued with strength and conditioning able to increase time on the nustep and reps on the machines    PT Treatment/Interventions  Therapeutic activities;Therapeutic exercise;Patient/family education    PT Next Visit Plan  Cont general LE strength and endurance, quad focus, hamstring and piriformis stretching, bike or nustep.  can incoporate some standing/balance    PT Home Exercise Plan  walking, sit to stand        Patient will benefit from skilled therapeutic intervention in order to improve the following deficits and impairments:  Decreased activity tolerance, Decreased endurance, Decreased strength, Impaired flexibility  Visit Diagnosis: Seminoma ([place])  Muscle  weakness (generalized)     Problem List Patient Active Problem List   Diagnosis Date Noted  . Counseling regarding advance care planning and goals of care 01/12/2018  . Seminoma ([place]) 12/10/2017    Shan Levans, PT 03/27/2018, 9:27 AM  [place], [place], [address] Phone: [phone]   Fax:  [phone]  Name: Hannibal Skalla MRN: [id number] Date of Birth: Sep 27, 1960

## 2018-03-30 ENCOUNTER — Other Ambulatory Visit: Payer: Self-pay | Admitting: Hematology

## 2018-03-30 ENCOUNTER — Telehealth: Payer: Self-pay | Admitting: *Deleted

## 2018-03-30 ENCOUNTER — Ambulatory Visit: Payer: BLUE CROSS/BLUE SHIELD | Admitting: Rehabilitation

## 2018-03-30 ENCOUNTER — Telehealth: Payer: Self-pay

## 2018-03-30 DIAGNOSIS — C6290 Malignant neoplasm of unspecified testis, unspecified whether descended or undescended: Secondary | ICD-10-CM

## 2018-03-30 NOTE — Telephone Encounter (Signed)
Per 12/16 walk in patient had already spoke with [facility] concerning the CT, and return appt.per Irene Limbo okay to r/s for the 1/3 and add a lab

## 2018-03-30 NOTE — Telephone Encounter (Signed)
Called to clarify appt for CT scan mad and wanted to know when to see MD. CT is scheduled for 12/26. Informed him that Dr. Irene Limbo wants to see him the day following the scan, however Dr. Irene Limbo will be out of the office on the 27th and will return on 1/2. Follow up MD appt is on 04/17/18.

## 2018-03-31 ENCOUNTER — Inpatient Hospital Stay: Payer: BLUE CROSS/BLUE SHIELD

## 2018-03-31 ENCOUNTER — Inpatient Hospital Stay: Payer: BLUE CROSS/BLUE SHIELD | Admitting: Hematology

## 2018-04-01 ENCOUNTER — Encounter: Payer: Self-pay | Admitting: Rehabilitation

## 2018-04-01 ENCOUNTER — Ambulatory Visit: Payer: BLUE CROSS/BLUE SHIELD | Admitting: Rehabilitation

## 2018-04-01 DIAGNOSIS — C6290 Malignant neoplasm of unspecified testis, unspecified whether descended or undescended: Secondary | ICD-10-CM | POA: Diagnosis not present

## 2018-04-01 DIAGNOSIS — M6281 Muscle weakness (generalized): Secondary | ICD-10-CM

## 2018-04-01 NOTE — Therapy (Signed)
[place], [place], [address] Phone: [phone]   Fax:  [phone]  Physical Therapy Treatment  Patient Details  Name: Neil Berg MRN: [id number] Date of Birth: June 15, 1960 Referring Provider (PT): Dr. Irene Limbo   Encounter Date: 04/01/2018  PT End of Session - 04/01/18 0811    Visit Number  4    Number of Visits  8    Date for PT Re-Evaluation  04/22/18    PT Start Time  0800    PT Stop Time  0840    PT Time Calculation (min)  40 min    Activity Tolerance  Patient tolerated treatment well    Behavior During Therapy  [facility] for tasks assessed/performed       Past Medical History:  Diagnosis Date  . Chest pain   . Diabetes mellitus without complication ([place])   . Hypertension   . Obesity     Past Surgical History:  Procedure Laterality Date  . EYE SURGERY     left eye cataract removal / implant   . IR IMAGING GUIDED PORT INSERTION  12/11/2017  . TONSILLECTOMY      There were no vitals filed for this visit.  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  Pt will have a PET scan next week to see if any other treatment is needed but hoping not.  no other significant medical history    Patient Stated Goals  decrease fatigue, avoid more falls, get back to work , strength back in the legs     Currently in Pain?  Yes    Pain Score  4     Pain Location  Back    Pain Orientation  Mid    Pain Descriptors / Indicators  Dull    Pain Type  Chronic pain    Pain Onset  In the past 7 days    Pain Frequency  Intermittent    Aggravating Factors   just random                       [facility] Adult PT Treatment/Exercise - 04/01/18 0001      Knee/Hip Exercises: Aerobic   Nustep  level 7 x 72min      Knee/Hip Exercises: Machines for  Strengthening   Cybex Knee Extension  35# 3x10     Other Machine  leg sled 60# 2x10      Knee/Hip Exercises: Standing   Lateral Step Up  Step Height: 8";1 set;10 reps    Forward Step Up  Step Height: 8";1 set;10 reps;Both    Other Standing Knee Exercises  trx squats x 15, green band side steps x 10 R/L x 3, monster steps x 10 x 3                   PT Long Term Goals - 03/18/18 1227      PT LONG TERM GOAL #1   Title  Pt will perform sit to stand x 15 in 30 seconds to match or beat the norm for his age group    Baseline  12 (21" table)    Time  5    Period  Weeks    Status  New    Target Date  04/22/17      PT LONG  TERM GOAL #2   Title  Pt will improve knee ext MMT to 5/5 bilaterally     Time  5    Period  Weeks    Status  New    Target Date  04/22/17      PT LONG TERM GOAL #3   Title  Pt will report no difficulty walking up the stairs to his home after performing work activities    Time  5    Period  Weeks    Status  New    Target Date  04/22/17      PT LONG TERM GOAL #4   Title  Pt will be ind with final HEP or gym program     Time  5    Period  Weeks    Status  New    Target Date  04/22/17            Plan - 04/01/18 4599    Clinical Impression Statement  A bit more back pain today but he reports this is a chronic issue.  Tolerated all strengthening well.  Able to increase Nustep time and add on some more standing exercises    PT Frequency  2x / week    PT Duration  4 weeks    PT Next Visit Plan  Cont general LE strength and endurance, quad focus, hamstring and piriformis stretching, bike or nustep.  can incoporate some standing/balance    Consulted and Agree with Plan of Care  Patient       Patient will benefit from skilled therapeutic intervention in order to improve the following deficits and impairments:     Visit Diagnosis: Seminoma ([place])  Muscle weakness (generalized)     Problem List Patient Active Problem List   Diagnosis Date  Noted  . Counseling regarding advance care planning and goals of care 01/12/2018  . Seminoma ([place]) 12/10/2017    Shan Levans, PT 04/01/2018, 8:44 AM  [place], [place], [address] Phone: [phone]   Fax:  [phone]  Name: Neil Berg MRN: [id number] Date of Birth: [birth year]

## 2018-04-02 ENCOUNTER — Encounter (HOSPITAL_COMMUNITY): Payer: Self-pay

## 2018-04-02 ENCOUNTER — Ambulatory Visit (HOSPITAL_COMMUNITY): Payer: BLUE CROSS/BLUE SHIELD

## 2018-04-06 ENCOUNTER — Ambulatory Visit: Payer: BLUE CROSS/BLUE SHIELD | Admitting: Physical Therapy

## 2018-04-09 ENCOUNTER — Ambulatory Visit (HOSPITAL_COMMUNITY)
Admission: RE | Admit: 2018-04-09 | Discharge: 2018-04-09 | Disposition: A | Discharge status: Home / Self Care | Payer: BLUE CROSS/BLUE SHIELD | Source: Ambulatory Visit | Attending: Hematology | Admitting: Hematology

## 2018-04-09 ENCOUNTER — Ambulatory Visit: Payer: BLUE CROSS/BLUE SHIELD | Admitting: Hematology

## 2018-04-09 DIAGNOSIS — C6290 Malignant neoplasm of unspecified testis, unspecified whether descended or undescended: Secondary | ICD-10-CM | POA: Insufficient documentation

## 2018-04-09 MED ORDER — IOHEXOL 300 MG/ML  SOLN
100.0000 mL | Freq: Once | INTRAMUSCULAR | Status: AC | PRN
  Administered 2018-04-09: 100 mL via INTRAVENOUS

## 2018-04-09 MED ORDER — SODIUM CHLORIDE (PF) 0.9 % IJ SOLN
INTRAMUSCULAR | Status: AC
  Filled 2018-04-09: qty 50

## 2018-04-13 ENCOUNTER — Ambulatory Visit: Payer: BLUE CROSS/BLUE SHIELD | Admitting: Rehabilitation

## 2018-04-16 ENCOUNTER — Other Ambulatory Visit: Payer: Self-pay | Admitting: *Deleted

## 2018-04-17 ENCOUNTER — Inpatient Hospital Stay: Payer: BLUE CROSS/BLUE SHIELD | Attending: Hematology | Admitting: Hematology

## 2018-04-17 ENCOUNTER — Telehealth: Payer: Self-pay | Admitting: Hematology

## 2018-04-17 ENCOUNTER — Inpatient Hospital Stay: Payer: BLUE CROSS/BLUE SHIELD

## 2018-04-17 ENCOUNTER — Other Ambulatory Visit: Payer: Self-pay | Admitting: Hematology

## 2018-04-17 VITALS — BP 137/84 | HR 100 | Temp 98.1°F | Resp 17 | Ht 72.0 in | Wt 293.3 lb

## 2018-04-17 DIAGNOSIS — C6290 Malignant neoplasm of unspecified testis, unspecified whether descended or undescended: Secondary | ICD-10-CM | POA: Diagnosis not present

## 2018-04-17 DIAGNOSIS — I10 Essential (primary) hypertension: Secondary | ICD-10-CM | POA: Insufficient documentation

## 2018-04-17 DIAGNOSIS — Z79899 Other long term (current) drug therapy: Secondary | ICD-10-CM | POA: Diagnosis not present

## 2018-04-17 DIAGNOSIS — Z9221 Personal history of antineoplastic chemotherapy: Secondary | ICD-10-CM | POA: Diagnosis not present

## 2018-04-17 DIAGNOSIS — N400 Enlarged prostate without lower urinary tract symptoms: Secondary | ICD-10-CM | POA: Insufficient documentation

## 2018-04-17 DIAGNOSIS — E669 Obesity, unspecified: Secondary | ICD-10-CM | POA: Insufficient documentation

## 2018-04-17 DIAGNOSIS — E119 Type 2 diabetes mellitus without complications: Secondary | ICD-10-CM | POA: Diagnosis not present

## 2018-04-17 DIAGNOSIS — Z7982 Long term (current) use of aspirin: Secondary | ICD-10-CM | POA: Insufficient documentation

## 2018-04-17 DIAGNOSIS — Z95828 Presence of other vascular implants and grafts: Secondary | ICD-10-CM

## 2018-04-17 LAB — CBC WITH DIFFERENTIAL/PLATELET
Abs Immature Granulocytes: 0.03 10*3/uL (ref 0.00–0.07)
Basophils Absolute: 0 10*3/uL (ref 0.0–0.1)
Basophils Relative: 0 %
Eosinophils Absolute: 0.2 10*3/uL (ref 0.0–0.5)
Eosinophils Relative: 2 %
HCT: 41.4 % (ref 39.0–52.0)
Hemoglobin: 13.2 g/dL (ref 13.0–17.0)
Immature Granulocytes: 0 %
Lymphocytes Relative: 22 %
Lymphs Abs: 2.1 10*3/uL (ref 0.7–4.0)
MCH: 28.7 pg (ref 26.0–34.0)
MCHC: 31.9 g/dL (ref 30.0–36.0)
MCV: 90 fL (ref 80.0–100.0)
MONO ABS: 0.8 10*3/uL (ref 0.1–1.0)
Monocytes Relative: 8 %
NEUTROS ABS: 6.5 10*3/uL (ref 1.7–7.7)
Neutrophils Relative %: 68 %
Platelets: 258 10*3/uL (ref 150–400)
RBC: 4.6 MIL/uL (ref 4.22–5.81)
RDW: 13.8 % (ref 11.5–15.5)
WBC: 9.6 10*3/uL (ref 4.0–10.5)
nRBC: 0 % (ref 0.0–0.2)

## 2018-04-17 LAB — CMP (CANCER CENTER ONLY)
ALT: 19 U/L (ref 0–44)
AST: 15 U/L (ref 15–41)
Albumin: 4.4 g/dL (ref 3.5–5.0)
Alkaline Phosphatase: 121 U/L (ref 38–126)
Anion gap: 14 (ref 5–15)
BUN: 23 mg/dL — ABNORMAL HIGH (ref 6–20)
CO2: 22 mmol/L (ref 22–32)
Calcium: 9.9 mg/dL (ref 8.9–10.3)
Chloride: 100 mmol/L (ref 98–111)
Creatinine: 1.05 mg/dL (ref 0.61–1.24)
GFR, Estimated: >60 mL/min (ref >60)
Glucose, Bld: 212 mg/dL — ABNORMAL HIGH (ref 70–99)
Potassium: 4.4 mmol/L (ref 3.5–5.1)
Sodium: 136 mmol/L (ref 135–145)
Total Bilirubin: 0.5 mg/dL (ref 0.3–1.2)
Total Protein: 7.7 g/dL (ref 6.5–8.1)

## 2018-04-17 LAB — LACTATE DEHYDROGENASE: LDH: 197 U/L — ABNORMAL HIGH (ref 98–192)

## 2018-04-17 NOTE — Telephone Encounter (Signed)
Patient declined avs and calendar  °

## 2018-04-17 NOTE — Progress Notes (Signed)
HEMATOLOGY/ONCOLOGY CLNIC NOTE  Date of Service: 04/17/2018  Patient Care Team: Neil Contras, MD as PCP - General (Family Medicine)  CHIEF COMPLAINTS/PURPOSE OF CONSULTATION:  F/u for extragondal seminoma  HISTORY OF PRESENTING ILLNESS:   Neil Berg is a wonderful 58 y.o. male who has been referred to [place] by Dr. Antony Berg for evaluation and management of Lymphadenopathy. He is accompanied today by his wife. The pt reports that he is doing well overall and looking [place]   The pt reports that he hasn't had any pain in over a week and recently appeared to [facility] on 10/17/17. He notes that he woke up on the morning of 10/17/17 with significant, unique, back and kidney pain which began to spread around to the RUQ of his abdomen as the morning progressed. He notes that his pain went away after taking pain medications for 5 days, and describes that the pain was waxing and waning. He also endorses some positional exacerbation to his pain. He denies any nausea, vomiting, diarrhea, changes in bowel habits, testicular pain or swelling, difficulty/changes in urination at the time or since then.    The pt also notes that  In the week of these symptoms he felt very cold, which is different for him as he describes himself as very warm natured. He also notes that his appetite weakened and he lost about 10 pounds, and was abiding by the [employer]. He notes that his appetite has returned and he is eating normally again. He denies any pain associated with meals. He denies recent exotic or international travel  He takes Jardiance for his DM. He notes that his PSA spiked two years ago, had a biopsy, and his PSA counts have decreased recently and is believed to be BPH.    He also notes that he had a recurring rash on his calves, worse on the left, that was red and flat, mimicking his sock impression, but extending further up his leg. He notes that this happened over  3 months, and that it came and went without any discernable pattern. He denies any associated itching.   He also notes a place on the front of his left lower leg that has been slightly swollen for the past a week. He denies remembering running into anything, bruising, or pain.   Of note prior to the patient's visit today, pt has had CT A/P completed on 10/14/17 with results revealing Retroperitoneal lymphadenopathy, highly suspicious for lymphoma or metastatic disease. 2. Prostate enlargement 3.  Aortic Atherosclerosis.   Most recent lab results (10/17/17) of CBC w/diff is as follows: all values are WNL except for WBC at 13.9k, RBC at 5.83, ANC at 10.0k, Monocytes abs at 1.4k.  On review of systems, pt reports recent back pain, feeling cold recently, recent abdominal pain, and denies nausea, vomiting, diarrhea, changes in bowel habits, difficulty/changes in urination, blood in the urine, abdominal trauma, constipation, fevers, night sweats, chills, testicular pain or swelling, noticing any lumps or bumps, unexpected weight loss, and any other symptoms.   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  On Social Hx the pt reports working in [employer] and denies chemical/radiaiton exposure. He denies ever smoking and other drug use.  Interval History:   Neil Berg returns today for management, evaluation after completing 4 cycles of EP  treatment of his Stage II extra-gonadal retroperitoneal Seminoma. The patient's last visit with [place] was on 03/10/18. The pt reports that he is doing well overall.   The pt reports that he feels that the skin around his port site is "dense," but denies any redness or pain at the site. Aside from this, the pt notes that he has not developed any concerns in the interim. He has been feeling well, eating well, and endorses good energy levels. The  pt notes that he is feeling a little better every day. He notes that he completed his course of PT in the interim as well.   The pt continues on Jardiance and notes that he has gained some weight since beginning treatment.   Of note since the patient's last visit, pt has had a CT C/A/P completed on 04/09/18 with results revealing no metastatic adenopathy in the chest. Abdomen/Pelvis: 1. Interval reduction in volume of LEFT periaortic metastatic lymph node. 2. No new adenopathy in the abdomen pelvis. 3. Benign-appearing splenic in LEFT renal cystic lesions.  Lab results today (04/17/18) of CBC w/diff and CMP is as follows: all values are WNL except for Glucose at 212, BUN at 23. 04/17/18 LDH at 197 04/17/18 AFP Tumor marker 2.4 - WNL  On review of systems, pt reports good energy levels, eating well, improving activity, weight gain, normal bowel movements, and denies redness around port site, pain at port site, concerns for infections, back pains, abdominal pains, leg swelling, difficulty urinating, and any other symptoms.   MEDICAL HISTORY:  Past Medical History:  Diagnosis Date  . Chest pain   . Diabetes mellitus without complication ([place])   . Hypertension   . Obesity   Type II Diabetes Mellitus   SURGICAL HISTORY: Past Surgical History:  Procedure Laterality Date  . EYE SURGERY     left eye cataract removal / implant   . IR IMAGING GUIDED PORT INSERTION  12/11/2017  . TONSILLECTOMY      Tonsillectomy 1974 Cataract surgery-left eye 04/2015 Colonoscopy 06/04/16  SOCIAL HISTORY: Social History   Socioeconomic History  . Marital status: Married    Spouse name: Not on file  . Number of children: Not on file  . Years of education: Not on file  . Highest education level: Not on file  Occupational History  . Not on file  Social Needs  . Financial resource strain: Not on file  . Food insecurity:    Worry: Not on file    Inability: Not on file  . Transportation needs:    Medical: Not  on file    Non-medical: Not on file  Tobacco Use  . Smoking status: Never Smoker  . Smokeless tobacco: Never Used  Substance and Sexual Activity  . Alcohol use: No  . Drug use: No  . Sexual activity: Not on file  Lifestyle  . Physical activity:    Days per week: Not on file    Minutes per session: Not on file  . Stress: Not on file  Relationships  . Social connections:    Talks on phone: Not on file    Gets together: Not on file    Attends religious service: Not on file    Active member of club or organization: Not on file    Attends meetings of clubs or organizations: Not on file    Relationship status: Not on file  . Intimate partner violence:    Fear of current or ex partner: Not on file  Emotionally abused: Not on file    Physically abused: Not on file    Forced sexual activity: Not on file  Other Topics Concern  . Not on file  Social History Narrative  . Not on file    FAMILY HISTORY: Family History  Problem Relation Age of Onset  . Heart disease Maternal Grandmother   . Heart attack Maternal Grandfather     ALLERGIES:  has No Known Allergies.  MEDICATIONS:  Current Outpatient Medications  Medication Sig Dispense Refill  . amLODipine (NORVASC) 5 MG tablet Take 1 tablet (5 mg total) by mouth daily. 30 tablet 11  . amoxicillin-clavulanate (AUGMENTIN) 875-125 MG tablet Take 1 tablet by mouth 2 (two) times daily. 20 tablet 0  . aspirin EC 81 MG tablet Take 81 mg by mouth daily.    Marland Kitchen b complex vitamins tablet Take 1 tablet by mouth daily.    Marland Kitchen dexamethasone (DECADRON) 4 MG tablet Take 2 tablets by mouth once a day on the day starting on day 6 of chemotherapy and then take 2 tablets two times a day for 2 days. 30 tablet 1  . HYDROcodone-acetaminophen (NORCO/VICODIN) 5-325 MG tablet Take 1 tablet by mouth every 6 (six) hours as needed for moderate pain or severe pain. 30 tablet 0  . JARDIANCE 25 MG TABS tablet TK 1 T PO QAM  0  . lidocaine-prilocaine (EMLA) cream  Apply to affected area once 30 g 3  . lisinopril (PRINIVIL,ZESTRIL) 20 MG tablet TAKE 1 TABLET BY MOUTH EVERY DAY. DISCONTINUE LISINOPRIL/HCTZ COMBINATION 30 tablet 0  . LORazepam (ATIVAN) 0.5 MG tablet Take 1 tablet (0.5 mg total) by mouth every 6 (six) hours as needed (Nausea or vomiting). 30 tablet 0  . metoCLOPramide (REGLAN) 10 MG tablet Take 1 tablet (10 mg total) by mouth every 8 (eight) hours as needed (nausea and hiccups). 20 tablet 0  . ondansetron (ZOFRAN) 8 MG tablet Take 1 tablet (8 mg total) by mouth 2 (two) times daily as needed. Start on day 8 of chemotherapy. 30 tablet 1  . pantoprazole (PROTONIX) 40 MG tablet TAKE 1 TABLET(40 MG) BY MOUTH DAILY 30 tablet 0  . prochlorperazine (COMPAZINE) 10 MG tablet Take 1 tablet (10 mg total) by mouth every 6 (six) hours as needed (Nausea or vomiting). 30 tablet 1   No current facility-administered medications for this visit.     REVIEW OF SYSTEMS:    A 10+ POINT REVIEW OF SYSTEMS WAS OBTAINED including neurology, dermatology, psychiatry, cardiac, respiratory, lymph, extremities, GI, GU, Musculoskeletal, constitutional, breasts, reproductive, HEENT.  All pertinent positives are noted in the HPI.  All others are negative.   PHYSICAL EXAMINATION: ECOG PERFORMANCE STATUS: 1 - Symptomatic but completely ambulatory  VS reviewed  GENERAL:alert, in no acute distress and comfortable SKIN: no acute rashes, no significant lesions EYES: conjunctiva are pink and non-injected, sclera anicteric OROPHARYNX: MMM, no exudates, no oropharyngeal erythema or ulceration NECK: supple, no JVD LYMPH:  no palpable lymphadenopathy in the cervical, axillary or inguinal regions LUNGS: clear to auscultation b/l with normal respiratory effort HEART: regular rate & rhythm ABDOMEN:  normoactive bowel sounds , non tender, not distended. No palpable hepatosplenomegaly.  TESTES: no obvious palpable testicular mass  Extremity: no pedal edema PSYCH: alert & oriented x  3 with fluent speech NEURO: no focal motor/sensory deficits   LABORATORY DATA:  I have reviewed the data as listed  . CBC Latest Ref Rng & Units 04/17/2018 03/10/2018 02/23/2018  WBC 4.0 - 10.5 K/uL 9.6  8.7 13.1(H)  Hemoglobin 13.0 - 17.0 g/dL 13.2 12.1(L) 12.5(L)  Hematocrit 39.0 - 52.0 % 41.4 36.6(L) 38.3(L)  Platelets 150 - 400 K/uL 258 106(L) 200  ANC 300  . CMP Latest Ref Rng & Units 04/17/2018 03/10/2018 02/23/2018  Glucose 70 - 99 mg/dL 212(H) 232(H) 302(H)  BUN 6 - 20 mg/dL 23(H) 16 12  Creatinine 0.61 - 1.24 mg/dL 1.05 0.74 0.78  Sodium 135 - 145 mmol/L 136 137 135  Potassium 3.5 - 5.1 mmol/L 4.4 4.3 5.0  Chloride 98 - 111 mmol/L 100 103 98  CO2 22 - 32 mmol/L 22 24 27   Calcium 8.9 - 10.3 mg/dL 9.9 8.7(L) 9.1  Total Protein 6.5 - 8.1 g/dL 7.7 6.2(L) 6.4(L)  Total Bilirubin 0.3 - 1.2 mg/dL 0.5 0.3 0.5  Alkaline Phos 38 - 126 U/L 121 155(H) 144(H)  AST 15 - 41 U/L 15 15 16   ALT 0 - 44 U/L 19 23 35    Pathology:   11/25/17 Tissue Flow Cytometry:    RADIOGRAPHIC STUDIES: I have personally reviewed the radiological images as listed and agreed with the findings in the report. Ct Chest W Contrast  Result Date: 04/09/2018 CLINICAL DATA:  Pure seminoma. Testicular cancer. Diagnosis July 2019. Last chemotherapy September 2019. No current complaints. EXAM: CT CHEST, ABDOMEN, AND PELVIS WITH CONTRAST TECHNIQUE: Multidetector CT imaging of the chest, abdomen and pelvis was performed following the standard protocol during bolus administration of intravenous contrast. CONTRAST:  182mL OMNIPAQUE IOHEXOL 300 MG/ML  SOLN COMPARISON:  PET-CT 11/17/2017, CT 10/14/2017 FINDINGS: CT CHEST FINDINGS Cardiovascular: Port in the anterior chest wall with tip in distal SVC. No significant vascular findings. Normal heart size. No pericardial effusion. Mediastinum/Nodes: No axillary or supraclavicular adenopathy. No mediastinal hilar adenopathy Lungs/Pleura: No suspicious pulmonary nodules. Airways  normal Musculoskeletal: No aggressive osseous lesion. CT ABDOMEN AND PELVIS FINDINGS Hepatobiliary: No focal hepatic lesion. No biliary ductal dilatation. Gallbladder is normal. Common bile duct is normal. Pancreas: Pancreas is normal. No ductal dilatation. No pancreatic inflammation. Spleen: Spleen is normal volume. Low-density lesion the anterior margin of the spleen measuring 14 mm unchanged. Adrenals/urinary tract: Adrenal glands normal. Low-density cortical lesion in the LEFT kidney simple fluid attenuation consistent benign cysts. Exophytic lesion from the upper pole of the RIGHT kidney also has simple fluid attenuation most consistent benign cysts ureters bladder normal. Bladder normal Stomach/Bowel: Stomach, small bowel, appendix, and cecum are normal. The colon and rectosigmoid colon are normal. Vascular/Lymphatic: Abdominal aorta is normal caliber with atherosclerotic calcification. Enlarged lymph node LEFT aorta inferior to the renal veins measures 20 mm (image 79/2 decreased in size from 42 mm on CT 10/14/2017. No additional adenopathy. No pelvic/iliac adenopathy. No inguinal adenopathy. Reproductive: Prostate normal. Other: No free fluid. Musculoskeletal: No aggressive osseous lesion. IMPRESSION: Chest Impression: 1. No metastatic adenopathy Abdomen / Pelvis Impression: 1. Interval reduction in volume of LEFT periaortic metastatic lymph node. 2. No new adenopathy in the abdomen pelvis. 3. Benign-appearing splenic in LEFT renal cystic lesions. Electronically Signed   By: Suzy Bouchard M.D.   On: 04/09/2018 09:34   Ct Abdomen Pelvis W Contrast  Result Date: 04/09/2018 CLINICAL DATA:  Pure seminoma. Testicular cancer. Diagnosis July 2019. Last chemotherapy September 2019. No current complaints. EXAM: CT CHEST, ABDOMEN, AND PELVIS WITH CONTRAST TECHNIQUE: Multidetector CT imaging of the chest, abdomen and pelvis was performed following the standard protocol during bolus administration of intravenous  contrast. CONTRAST:  160mL OMNIPAQUE IOHEXOL 300 MG/ML  SOLN COMPARISON:  PET-CT 11/17/2017, CT  10/14/2017 FINDINGS: CT CHEST FINDINGS Cardiovascular: Port in the anterior chest wall with tip in distal SVC. No significant vascular findings. Normal heart size. No pericardial effusion. Mediastinum/Nodes: No axillary or supraclavicular adenopathy. No mediastinal hilar adenopathy Lungs/Pleura: No suspicious pulmonary nodules. Airways normal Musculoskeletal: No aggressive osseous lesion. CT ABDOMEN AND PELVIS FINDINGS Hepatobiliary: No focal hepatic lesion. No biliary ductal dilatation. Gallbladder is normal. Common bile duct is normal. Pancreas: Pancreas is normal. No ductal dilatation. No pancreatic inflammation. Spleen: Spleen is normal volume. Low-density lesion the anterior margin of the spleen measuring 14 mm unchanged. Adrenals/urinary tract: Adrenal glands normal. Low-density cortical lesion in the LEFT kidney simple fluid attenuation consistent benign cysts. Exophytic lesion from the upper pole of the RIGHT kidney also has simple fluid attenuation most consistent benign cysts ureters bladder normal. Bladder normal Stomach/Bowel: Stomach, small bowel, appendix, and cecum are normal. The colon and rectosigmoid colon are normal. Vascular/Lymphatic: Abdominal aorta is normal caliber with atherosclerotic calcification. Enlarged lymph node LEFT aorta inferior to the renal veins measures 20 mm (image 79/2 decreased in size from 42 mm on CT 10/14/2017. No additional adenopathy. No pelvic/iliac adenopathy. No inguinal adenopathy. Reproductive: Prostate normal. Other: No free fluid. Musculoskeletal: No aggressive osseous lesion. IMPRESSION: Chest Impression: 1. No metastatic adenopathy Abdomen / Pelvis Impression: 1. Interval reduction in volume of LEFT periaortic metastatic lymph node. 2. No new adenopathy in the abdomen pelvis. 3. Benign-appearing splenic in LEFT renal cystic lesions. Electronically Signed   By: Suzy Bouchard M.D.   On: 04/09/2018 09:34    ASSESSMENT & PLAN:   58 y.o. male with  1. 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 Prostate enlargement.  11/17/17 PET/CT revealed Isolated hypermetabolic retroperitoneal lymphadenopathy. Findings suspicious for lymphoma as no primary lesion is identified to suggest this is metastatic adenopathy. Recommend retroperitoneal biopsy. The largest left-sided node appears necrotic and is actually decreased in size since the prior CT scan. It appeared inflamed on the prior study and may have infarcted. I would not recommend biopsying this lesion. No adenopathy in the neck, axilla, chest, pelvis or inguinal regions. No worrisome pulmonary lesions or osseous lesions.  11/25/17 pathology results revealed  finding of metastatic seminoma    12/04/17 US Scrotum w/doppler revealed Negative scrotal ultrasound with no distinct testicular mass Identified. 2. 5 mm simple left epididymal cyst, almost certainly benign and felt to be incidental in nature.   S/p 4 cycles of EP treatment, tolerated well overall  2. Chemotherapy related neutropenia -- resolved.  PLAN:  -Discussed pt labwork today, 04/17/18; blood counts have normalized, chemistries are stable. LDH at 197. -04/17/18 AFP is 2.4 -Discussed the 04/09/18 CT C/A/P which revealed no metastatic adenopathy in the chest. Abdomen/Pelvis: 1. Interval reduction in volume of LEFT periaortic metastatic lymph node. 2. No new adenopathy in the abdomen pelvis. 3. Benign-appearing splenic in LEFT renal cystic lesions.  -Reviewed the NCCN guidelines with the pt, which at this point recommend seeing the pt with clinic visits every 3 months and repeating the CT scan in 6 months  -Recommend staying up to date  with annual flu vaccine, and both pneumonia vaccines every 5 years -Will refer the pt to IR for port removal  -Recommended that the pt continue to eat well, drink at least 48-64 oz of water each day, and walk 20-30 minutes  each day. -Will see the pt back in 3 months    -Port a cath removal within a week -RTC with Dr Irene Limbo with labs in 3 months   All of the patients questions were answered with apparent satisfaction. The patient knows to call the clinic with any problems, questions or concerns.  The total time spent in the appt was 25 minutes and more than 50% was on counseling and direct patient cares.    Sullivan Lone MD MS AAHIVMS [facility] Hematology/Oncology Physician [facility]  (Office):       [phone] (Work cell):  [phone] (Fax):           [phone]  04/17/2018 1:30 PM  I, Baldwin Jamaica, am acting as a scribe for Dr. Sullivan Lone.   .I have reviewed the above documentation for accuracy and completeness, and I agree with the above. Brunetta Genera MD

## 2018-04-18 LAB — AFP TUMOR MARKER: AFP, Serum, Tumor Marker: 2.4 ng/mL (ref 0.0–8.3)

## 2018-04-20 DIAGNOSIS — C6290 Malignant neoplasm of unspecified testis, unspecified whether descended or undescended: Secondary | ICD-10-CM | POA: Diagnosis not present

## 2018-04-20 DIAGNOSIS — E1169 Type 2 diabetes mellitus with other specified complication: Secondary | ICD-10-CM | POA: Diagnosis not present

## 2018-04-20 DIAGNOSIS — I10 Essential (primary) hypertension: Secondary | ICD-10-CM | POA: Diagnosis not present

## 2018-04-20 DIAGNOSIS — E782 Mixed hyperlipidemia: Secondary | ICD-10-CM | POA: Diagnosis not present

## 2018-04-30 ENCOUNTER — Other Ambulatory Visit: Payer: Self-pay | Admitting: Radiology

## 2018-05-01 ENCOUNTER — Ambulatory Visit (HOSPITAL_COMMUNITY)
Admission: RE | Admit: 2018-05-01 | Discharge: 2018-05-01 | Disposition: A | Discharge status: Home / Self Care | Payer: BLUE CROSS/BLUE SHIELD | Source: Ambulatory Visit | Attending: Hematology | Admitting: Hematology

## 2018-05-01 ENCOUNTER — Encounter (HOSPITAL_COMMUNITY): Payer: Self-pay

## 2018-05-01 DIAGNOSIS — C6290 Malignant neoplasm of unspecified testis, unspecified whether descended or undescended: Secondary | ICD-10-CM

## 2018-05-01 DIAGNOSIS — E669 Obesity, unspecified: Secondary | ICD-10-CM | POA: Insufficient documentation

## 2018-05-01 DIAGNOSIS — Z95828 Presence of other vascular implants and grafts: Secondary | ICD-10-CM

## 2018-05-01 DIAGNOSIS — Z7982 Long term (current) use of aspirin: Secondary | ICD-10-CM | POA: Insufficient documentation

## 2018-05-01 DIAGNOSIS — Z8547 Personal history of malignant neoplasm of testis: Secondary | ICD-10-CM | POA: Diagnosis not present

## 2018-05-01 DIAGNOSIS — Z452 Encounter for adjustment and management of vascular access device: Secondary | ICD-10-CM | POA: Insufficient documentation

## 2018-05-01 DIAGNOSIS — Z79899 Other long term (current) drug therapy: Secondary | ICD-10-CM | POA: Diagnosis not present

## 2018-05-01 DIAGNOSIS — Z5111 Encounter for antineoplastic chemotherapy: Secondary | ICD-10-CM | POA: Diagnosis not present

## 2018-05-01 DIAGNOSIS — Z7952 Long term (current) use of systemic steroids: Secondary | ICD-10-CM | POA: Insufficient documentation

## 2018-05-01 DIAGNOSIS — I10 Essential (primary) hypertension: Secondary | ICD-10-CM | POA: Diagnosis not present

## 2018-05-01 DIAGNOSIS — E119 Type 2 diabetes mellitus without complications: Secondary | ICD-10-CM | POA: Diagnosis not present

## 2018-05-01 HISTORY — PX: IR REMOVAL TUN ACCESS W/ PORT W/O FL MOD SED: IMG2290

## 2018-05-01 LAB — PROTIME-INR
INR: 0.83
Prothrombin Time: 11.3 seconds — ABNORMAL LOW (ref 11.4–15.2)

## 2018-05-01 LAB — CBC WITH DIFFERENTIAL/PLATELET
Abs Immature Granulocytes: 0.02 10*3/uL (ref 0.00–0.07)
Basophils Absolute: 0 10*3/uL (ref 0.0–0.1)
Basophils Relative: 0 %
Eosinophils Absolute: 0.1 10*3/uL (ref 0.0–0.5)
Eosinophils Relative: 1 %
HCT: 43.1 % (ref 39.0–52.0)
Hemoglobin: 13.9 g/dL (ref 13.0–17.0)
Immature Granulocytes: 0 %
LYMPHS ABS: 2.7 10*3/uL (ref 0.7–4.0)
Lymphocytes Relative: 30 %
MCH: 28.3 pg (ref 26.0–34.0)
MCHC: 32.3 g/dL (ref 30.0–36.0)
MCV: 87.8 fL (ref 80.0–100.0)
Monocytes Absolute: 0.7 10*3/uL (ref 0.1–1.0)
Monocytes Relative: 8 %
Neutro Abs: 5.6 10*3/uL (ref 1.7–7.7)
Neutrophils Relative %: 61 %
Platelets: 280 10*3/uL (ref 150–400)
RBC: 4.91 MIL/uL (ref 4.22–5.81)
RDW: 12.5 % (ref 11.5–15.5)
WBC: 9.1 10*3/uL (ref 4.0–10.5)
nRBC: 0 % (ref 0.0–0.2)

## 2018-05-01 LAB — GLUCOSE, CAPILLARY: Glucose-Capillary: 151 mg/dL — ABNORMAL HIGH (ref 70–99)

## 2018-05-01 MED ORDER — CEFAZOLIN SODIUM-DEXTROSE 2-4 GM/100ML-% IV SOLN
INTRAVENOUS | Status: AC
  Filled 2018-05-01: qty 100

## 2018-05-01 MED ORDER — MIDAZOLAM HCL 2 MG/2ML IJ SOLN
INTRAMUSCULAR | Status: AC
  Filled 2018-05-01: qty 2

## 2018-05-01 MED ORDER — FENTANYL CITRATE (PF) 100 MCG/2ML IJ SOLN
INTRAMUSCULAR | Status: AC
  Filled 2018-05-01: qty 2

## 2018-05-01 MED ORDER — MIDAZOLAM HCL 2 MG/2ML IJ SOLN
INTRAMUSCULAR | Status: AC | PRN
  Administered 2018-05-01 (×2): 1 mg via INTRAVENOUS
  Administered 2018-05-01: 2 mg via INTRAVENOUS
  Administered 2018-05-01: 1 mg via INTRAVENOUS

## 2018-05-01 MED ORDER — FENTANYL CITRATE (PF) 100 MCG/2ML IJ SOLN
INTRAMUSCULAR | Status: AC | PRN
  Administered 2018-05-01 (×2): 50 ug via INTRAVENOUS

## 2018-05-01 MED ORDER — DEXTROSE 5 % IV SOLN
3.0000 g | INTRAVENOUS | Status: AC
  Administered 2018-05-01: 3 g via INTRAVENOUS
  Filled 2018-05-01: qty 3

## 2018-05-01 MED ORDER — SODIUM CHLORIDE 0.9 % IV SOLN
INTRAVENOUS | Status: DC
  Administered 2018-05-01: 12:00:00 via INTRAVENOUS

## 2018-05-01 MED ORDER — LIDOCAINE-EPINEPHRINE (PF) 2 %-1:200000 IJ SOLN
INTRAMUSCULAR | Status: AC | PRN
  Administered 2018-05-01: 20 mL

## 2018-05-01 MED ORDER — LIDOCAINE-EPINEPHRINE (PF) 2 %-1:200000 IJ SOLN
INTRAMUSCULAR | Status: AC
  Filled 2018-05-01: qty 20

## 2018-05-01 NOTE — H&P (Signed)
Referring Physician(s): Brunetta Genera  Supervising Physician: Markus Daft  Patient Status:  [facility]  Chief Complaint:  "I'm getting my port out"  Subjective: Patient familiar to IR service from prior left periaortic lymph node biopsy on 11/25/2017 and right chest wall Port-A-Cath on 12/11/2017.  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  He no longer needs Port-A-Cath and presents today for removal.  He is currently asymptomatic.  Past Medical History:  Diagnosis Date  . Chest pain   . Diabetes mellitus without complication ([place])   . Hypertension   . Obesity    Past Surgical History:  Procedure Laterality Date  . EYE SURGERY     left eye cataract removal / implant   . IR IMAGING GUIDED PORT INSERTION  12/11/2017  . TONSILLECTOMY        Allergies: Patient has no known allergies.  Medications: Prior to Admission medications   Medication Sig Start Date End Date Taking? Authorizing Provider  amLODipine (NORVASC) 5 MG tablet Take 1 tablet (5 mg total) by mouth daily. 06/24/14  Yes Jerline Pain, MD  aspirin EC 81 MG tablet Take 81 mg by mouth daily.   Yes [provider]  b complex vitamins tablet Take 1 tablet by mouth daily.   Yes [provider]  JARDIANCE 25 MG TABS tablet TK 1 T PO QAM 08/28/17  Yes [provider]  lisinopril (PRINIVIL,ZESTRIL) 20 MG tablet TAKE 1 TABLET BY MOUTH EVERY DAY. DISCONTINUE LISINOPRIL/HCTZ COMBINATION 02/13/18  Yes Brunetta Genera, MD  amoxicillin-clavulanate (AUGMENTIN) 875-125 MG tablet Take 1 tablet by mouth 2 (two) times daily. 01/02/18   Brunetta Genera, MD  dexamethasone (DECADRON) 4 MG tablet Take 2 tablets by mouth once a day on the day starting on day 6 of chemotherapy and then take 2 tablets two times a day for 2 days. 12/10/17   Brunetta Genera, MD  HYDROcodone-acetaminophen (NORCO/VICODIN) 5-325 MG tablet Take 1 tablet by mouth every 6 (six) hours as needed for  moderate pain or severe pain. 10/23/17   Jaynee Eagles, PA-C  lidocaine-prilocaine (EMLA) cream Apply to affected area once 12/10/17   Brunetta Genera, MD  LORazepam (ATIVAN) 0.5 MG tablet Take 1 tablet (0.5 mg total) by mouth every 6 (six) hours as needed (Nausea or vomiting). 12/10/17   Brunetta Genera, MD  metoCLOPramide (REGLAN) 10 MG tablet Take 1 tablet (10 mg total) by mouth every 8 (eight) hours as needed (nausea and hiccups). 12/24/17   Brunetta Genera, MD  ondansetron (ZOFRAN) 8 MG tablet Take 1 tablet (8 mg total) by mouth 2 (two) times daily as needed. Start on day 8 of chemotherapy. 12/10/17   Brunetta Genera, MD  pantoprazole (PROTONIX) 40 MG tablet TAKE 1 TABLET(40 MG) BY MOUTH DAILY 01/23/18   Brunetta Genera, MD  prochlorperazine (COMPAZINE) 10 MG tablet Take 1 tablet (10 mg total) by mouth every 6 (six) hours as needed (Nausea or vomiting). 12/10/17   Brunetta Genera, MD     Vital Signs: Blood pressure 153/78, heart rate 98, temperature 98.4, respirations 17, O2 sat 99% room air Ht 6' (1.829 m)   Wt 293 lb 4.8 oz (133 kg)   BMI 39.78 kg/m   Physical Exam awake, alert.  Chest clear to auscultation bilaterally.  Clean, intact right chest wall Port-A-Cath.  Heart with regular rate and rhythm.  Abdomen soft, positive bowel sounds, nontender.  No lower extremity edema.  Imaging:  No results found.  Labs:  CBC: Recent Labs    02/23/18 0733 03/10/18 0921 04/17/18 1101 05/01/18 1213  WBC 13.1* 8.7 9.6 9.1  HGB 12.5* 12.1* 13.2 13.9  HCT 38.3* 36.6* 41.4 43.1  PLT 200 106* 258 280    COAGS: Recent Labs    11/25/17 0712 12/11/17 0758  INR 0.97 0.88  APTT 27  --     BMP: Recent Labs    02/02/18 0810 02/23/18 0733 03/10/18 0753 04/17/18 1101  NA 134* 135 137 136  K 4.4 5.0 4.3 4.4  CL 99 98 103 100  CO2 21* 27 24 22   GLUCOSE 240* 302* 232* 212*  BUN 19 12 16  23*  CALCIUM 9.1 9.1 8.7* 9.9  CREATININE 0.81 0.78 0.74 1.05  GFRNONAA  >60 >60 >60 >60  GFRAA >60 >60 >60 >60    LIVER FUNCTION TESTS: Recent Labs    02/02/18 0810 02/23/18 0733 03/10/18 0753 04/17/18 1101  BILITOT 0.3 0.5 0.3 0.5  AST 15 16 15 15   ALT 29 35 23 19  ALKPHOS 137* 144* 155* 121  PROT 6.8 6.4* 6.2* 7.7  ALBUMIN 3.7 3.4* 3.4* 4.4    Assessment and Plan: Pt with 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  He no longer needs Port-A-Cath and presents today for removal.  Details/risks of procedure, including but not limited to, internal bleeding, infection, injury to adjacent structures discussed with patient with his understanding and consent.   Electronically Signed: D. Rowe Robert, PA-C 05/01/2018, 12:23 PM   I spent a total of 20 minutes at the the patient's bedside AND on the patient's hospital floor or unit, greater than 50% of which was counseling/coordinating care for Port-A-Cath removal

## 2018-05-01 NOTE — Discharge Instructions (Signed)
Implanted Port Removal, Care After °This sheet gives you information about how to care for yourself after your procedure. Your health care provider may also give you more specific instructions. If you have problems or questions, contact your health care provider. °What can I expect after the procedure? °After the procedure, it is common to have: °· Soreness or pain near your incision. °· Some swelling or bruising near your incision. °Follow these instructions at home: °Medicines °· Take over-the-counter and prescription medicines only as told by your health care provider. °· If you were prescribed an antibiotic medicine, take it as told by your health care provider. Do not stop taking the antibiotic even if you start to feel better. °Bathing °· Do not take baths, swim, or use a hot tub until your health care provider approves. Ask your health care provider if you can take showers. You may only be allowed to take sponge baths. °Incision care ° °· Follow instructions from your health care provider about how to take care of your incision. Make sure you: °? Wash your hands with soap and water before you change your bandage (dressing). If soap and water are not available, use hand sanitizer. °? Change your dressing as told by your health care provider. °? Keep your dressing dry. °? Leave stitches (sutures), skin glue, or adhesive strips in place. These skin closures may need to stay in place for 2 weeks or longer. If adhesive strip edges start to loosen and curl up, you may trim the loose edges. Do not remove adhesive strips completely unless your health care provider tells you to do that. °· Check your incision area every day for signs of infection. Check for: °? More redness, swelling, or pain. °? More fluid or blood. °? Warmth. °? Pus or a bad smell. °Driving ° °· Do not drive for 24 hours if you were given a medicine to help you relax (sedative) during your procedure. °· If you did not receive a sedative, ask your  health care provider when it is safe to drive. °Activity °· Return to your normal activities as told by your health care provider. Ask your health care provider what activities are safe for you. °· Do not lift anything that is heavier than 10 lb (4.5 kg), or the limit that you are told, until your health care provider says that it is safe. °· Do not do activities that involve lifting your arms over your head. °General instructions °· Do not use any products that contain nicotine or tobacco, such as cigarettes and e-cigarettes. These can delay healing. If you need help quitting, ask your health care provider. °· Keep all follow-up visits as told by your health care provider. This is important. °Contact a health care provider if: °· You have more redness, swelling, or pain around your incision. °· You have more fluid or blood coming from your incision. °· Your incision feels warm to the touch. °· You have pus or a bad smell coming from your incision. °· You have pain that is not relieved by your pain medicine. °Get help right away if you have: °· A fever or chills. °· Chest pain. °· Difficulty breathing. °Summary °· After the procedure, it is common to have pain, soreness, swelling, or bruising near your incision. °· If you were prescribed an antibiotic medicine, take it as told by your health care provider. Do not stop taking the antibiotic even if you start to feel better. °· Do not drive for 24 hours   if you were given a sedative during your procedure. °· Return to your normal activities as told by your health care provider. Ask your health care provider what activities are safe for you. °This information is not intended to replace advice given to you by your health care provider. Make sure you discuss any questions you have with your health care provider. °Document Released: 03/13/2015 Document Revised: 05/15/2017 Document Reviewed: 05/15/2017 °Elsevier Interactive Patient Education © 2019 [employer] ° ° °Moderate Conscious Sedation, Adult, Care After °These instructions provide you with information about caring for yourself after your procedure. Your health care provider may also give you more specific instructions. Your treatment has been planned according to current medical practices, but problems sometimes occur. Call your health care provider if you have any problems or questions after your procedure. °What can I expect after the procedure? °After your procedure, it is common: °· To feel sleepy for several hours. °· To feel clumsy and have poor balance for several hours. °· To have poor judgment for several hours. °· To vomit if you eat too soon. °Follow these instructions at home: °For at least 24 hours after the procedure: ° °· Do not: °? Participate in activities where you could fall or become injured. °? Drive. °? Use heavy machinery. °? Drink alcohol. °? Take sleeping pills or medicines that cause drowsiness. °? Make important decisions or sign legal documents. °? Take care of children on your own. °· Rest. °Eating and drinking °· Follow the diet recommended by your health care provider. °· If you vomit: °? Drink water, juice, or soup when you can drink without vomiting. °? Make sure you have little or no nausea before eating solid foods. °General instructions °· Have a responsible adult stay with you until you are awake and alert. °· Take over-the-counter and prescription medicines only as told by your health care provider. °· If you smoke, do not smoke without supervision. °· Keep all follow-up visits as told by your health care provider. This is important. °Contact a health care provider if: °· You keep feeling nauseous or you keep vomiting. °· You feel light-headed. °· You develop a rash. °· You have a fever. °Get help right away if: °· You have trouble breathing. °This information is not intended to replace advice given to you by your health care provider. Make sure you discuss any questions  you have with your health care provider. °Document Released: 01/20/2013 Document Revised: 09/04/2015 Document Reviewed: 07/22/2015 °Elsevier Interactive Patient Education © 2019 [employer] ° °

## 2018-05-01 NOTE — Procedures (Signed)
Interventional Radiology Procedure:   Indications: Port no longer needed.  Completed therapy.  Procedure: Port removal  Findings: Complete removal of port  Complications: None     EBL: Minimal  Plan: Discharge to home in 1 hour.     Neil Ketcher R. Anselm Pancoast, MD  Pager: [phone]

## 2018-07-16 ENCOUNTER — Telehealth: Payer: Self-pay | Admitting: *Deleted

## 2018-07-16 NOTE — Telephone Encounter (Signed)
Contacted patient per [facility] current rescheduling guidelines.  Dr.Kale would like to reschedule appts for 4/3 and reschedule in one month.  Appt for 4/3 will be cancelled.  Informed patient that scheduling will contact them. Patient verbalized understanding. Schedule msg sent.

## 2018-07-17 ENCOUNTER — Other Ambulatory Visit: Payer: BLUE CROSS/BLUE SHIELD

## 2018-07-17 ENCOUNTER — Ambulatory Visit: Payer: BLUE CROSS/BLUE SHIELD | Admitting: Hematology

## 2018-07-20 ENCOUNTER — Telehealth: Payer: Self-pay | Admitting: Hematology

## 2018-07-20 NOTE — Telephone Encounter (Signed)
R/s appt per 4/2 sch message - pt aware of new appt date and time

## 2018-08-19 NOTE — Progress Notes (Signed)
HEMATOLOGY/ONCOLOGY CLNIC NOTE  Date of Service: 08/20/2018  Patient Care Team: Neil Contras, MD as PCP - General (Family Medicine)  CHIEF COMPLAINTS/PURPOSE OF CONSULTATION:  F/u for extragondal seminoma  HISTORY OF PRESENTING ILLNESS:   Neil Berg is a wonderful 58 y.o. male who has been referred to [place] by Dr. Antony Berg for evaluation and management of Lymphadenopathy. He is accompanied today by his wife. The pt reports that he is doing well overall and [place].   The pt reports that he hasn't had any pain in over a week and recently appeared to [facility] on 10/17/17. He notes that he woke up on the morning of 10/17/17 with significant, unique, back and kidney pain which began to spread around to the RUQ of his abdomen as the morning progressed. He notes that his pain went away after taking pain medications for 5 days, and describes that the pain was waxing and waning. He also endorses some positional exacerbation to his pain. He denies any nausea, vomiting, diarrhea, changes in bowel habits, testicular pain or swelling, difficulty/changes in urination at the time or since then.    The pt also notes that  In the week of these symptoms he felt very cold, which is different for him as he describes himself as very warm natured. He also notes that his appetite weakened and he lost about 10 pounds, and was abiding by the [employer]. He notes that his appetite has returned and he is eating normally again. He denies any pain associated with meals. He denies recent exotic or international travel  He takes Jardiance for his DM. He notes that his PSA spiked two years ago, had a biopsy, and his PSA counts have decreased recently and is believed to be BPH.    He also notes that he had a recurring rash on his calves, worse on the left, that was red and flat, mimicking his sock impression, but extending further up his leg. He notes that this happened over  3 months, and that it came and went without any discernable pattern. He denies any associated itching.   He also notes a place on the front of his left lower leg that has been slightly swollen for the past a week. He denies remembering running into anything, bruising, or pain.   Of note prior to the patient's visit today, pt has had CT A/P completed on 10/14/17 with results revealing Retroperitoneal lymphadenopathy, highly suspicious for lymphoma or metastatic disease. 2. Prostate enlargement 3.  Aortic Atherosclerosis.   Most recent lab results (10/17/17) of CBC w/diff is as follows: all values are WNL except for WBC at 13.9k, RBC at 5.83, ANC at 10.0k, Monocytes abs at 1.4k.  On review of systems, pt reports recent back pain, feeling cold recently, recent abdominal pain, and denies nausea, vomiting, diarrhea, changes in bowel habits, difficulty/changes in urination, blood in the urine, abdominal trauma, constipation, fevers, night sweats, chills, testicular pain or swelling, noticing any lumps or bumps, unexpected weight loss, and any other symptoms.   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  On Social Hx the pt reports working in [employer] and denies chemical/radiaiton exposure. He denies ever smoking and other drug use.  Interval History:   Neil Berg returns today for management, evaluation after completing 4 cycles of EP  treatment of his Stage II extra-gonadal retroperitoneal Seminoma. The patient's last visit with [place] was on 04/17/18. The pt reports that he is doing well overall.  The pt reports that he has not developed any new concerns in the interim. He notes that he is staying active and is walking a few miles every other day. He denies abdominal pains, changes in bowel habits, changes in urination, or back pains. The pt endorses good energy levels and is  eating well.  Lab results today (08/20/18) of CBC w/diff is as follows: all values are WNL. 08/20/18 AFP tumor marker is wnl 08/20/18 Beta HCG Quant is wnl LDH wnl  On review of systems, pt reports good energy levels, staying active, eating well, and denies abdominal pains, changes in bowel habits, changes in urination, back pains, mouth sores, dental pains, leg swelling, and any other symptoms.   MEDICAL HISTORY:  Past Medical History:  Diagnosis Date  . Chest pain   . Diabetes mellitus without complication ([place])   . Hypertension   . Obesity   Type II Diabetes Mellitus   SURGICAL HISTORY: Past Surgical History:  Procedure Laterality Date  . EYE SURGERY     left eye cataract removal / implant   . IR IMAGING GUIDED PORT INSERTION  12/11/2017  . IR REMOVAL TUN ACCESS W/ PORT W/O FL MOD SED  05/01/2018  . TONSILLECTOMY      Tonsillectomy 1974 Cataract surgery-left eye 04/2015 Colonoscopy 06/04/16  SOCIAL HISTORY: Social History   Socioeconomic History  . Marital status: Married    Spouse name: Not on file  . Number of children: Not on file  . Years of education: Not on file  . Highest education level: Not on file  Occupational History  . Not on file  Social Needs  . Financial resource strain: Not on file  . Food insecurity:    Worry: Not on file    Inability: Not on file  . Transportation needs:    Medical: Not on file    Non-medical: Not on file  Tobacco Use  . Smoking status: Never Smoker  . Smokeless tobacco: Never Used  Substance and Sexual Activity  . Alcohol use: No  . Drug use: No  . Sexual activity: Not on file  Lifestyle  . Physical activity:    Days per week: Not on file    Minutes per session: Not on file  . Stress: Not on file  Relationships  . Social connections:    Talks on phone: Not on file    Gets together: Not on file    Attends religious service: Not on file    Active member of club or organization: Not on file    Attends meetings of clubs  or organizations: Not on file    Relationship status: Not on file  . Intimate partner violence:    Fear of current or ex partner: Not on file    Emotionally abused: Not on file    Physically abused: Not on file    Forced sexual activity: Not on file  Other Topics Concern  . Not on file  Social History Narrative  . Not on file    FAMILY HISTORY: Family History  Problem Relation Age of Onset  . Heart disease Maternal Grandmother   . Heart attack Maternal Grandfather     ALLERGIES:  has No Known Allergies.  MEDICATIONS:  Current Outpatient Medications  Medication Sig Dispense Refill  . amLODipine (NORVASC) 5 MG tablet Take 1 tablet (  5 mg total) by mouth daily. 30 tablet 11  . amoxicillin-clavulanate (AUGMENTIN) 875-125 MG tablet Take 1 tablet by mouth 2 (two) times daily. 20 tablet 0  . aspirin EC 81 MG tablet Take 81 mg by mouth daily.    Marland Kitchen b complex vitamins tablet Take 1 tablet by mouth daily.    Marland Kitchen dexamethasone (DECADRON) 4 MG tablet Take 2 tablets by mouth once a day on the day starting on day 6 of chemotherapy and then take 2 tablets two times a day for 2 days. 30 tablet 1  . HYDROcodone-acetaminophen (NORCO/VICODIN) 5-325 MG tablet Take 1 tablet by mouth every 6 (six) hours as needed for moderate pain or severe pain. 30 tablet 0  . JARDIANCE 25 MG TABS tablet TK 1 T PO QAM  0  . lidocaine-prilocaine (EMLA) cream Apply to affected area once 30 g 3  . lisinopril (PRINIVIL,ZESTRIL) 20 MG tablet TAKE 1 TABLET BY MOUTH EVERY DAY. DISCONTINUE LISINOPRIL/HCTZ COMBINATION 30 tablet 0  . LORazepam (ATIVAN) 0.5 MG tablet Take 1 tablet (0.5 mg total) by mouth every 6 (six) hours as needed (Nausea or vomiting). 30 tablet 0  . metoCLOPramide (REGLAN) 10 MG tablet Take 1 tablet (10 mg total) by mouth every 8 (eight) hours as needed (nausea and hiccups). 20 tablet 0  . ondansetron (ZOFRAN) 8 MG tablet Take 1 tablet (8 mg total) by mouth 2 (two) times daily as needed. Start on day 8 of  chemotherapy. 30 tablet 1  . pantoprazole (PROTONIX) 40 MG tablet TAKE 1 TABLET(40 MG) BY MOUTH DAILY 30 tablet 0  . prochlorperazine (COMPAZINE) 10 MG tablet Take 1 tablet (10 mg total) by mouth every 6 (six) hours as needed (Nausea or vomiting). 30 tablet 1   No current facility-administered medications for this visit.     REVIEW OF SYSTEMS:    A 10+ POINT REVIEW OF SYSTEMS WAS OBTAINED including neurology, dermatology, psychiatry, cardiac, respiratory, lymph, extremities, GI, GU, Musculoskeletal, constitutional, breasts, reproductive, HEENT.  All pertinent positives are noted in the HPI.  All others are negative.   PHYSICAL EXAMINATION: ECOG PERFORMANCE STATUS: 1 - Symptomatic but completely ambulatory  VS reviewed  GENERAL:alert, in no acute distress and comfortable SKIN: no acute rashes, no significant lesions EYES: conjunctiva are pink and non-injected, sclera anicteric OROPHARYNX: MMM, no exudates, no oropharyngeal erythema or ulceration NECK: supple, no JVD LYMPH:  no palpable lymphadenopathy in the cervical, axillary or inguinal regions LUNGS: clear to auscultation b/l with normal respiratory effort HEART: regular rate & rhythm ABDOMEN:  normoactive bowel sounds , non tender, not distended. No palpable hepatosplenomegaly.  Extremity: no pedal edema PSYCH: alert & oriented x 3 with fluent speech NEURO: no focal motor/sensory deficits   LABORATORY DATA:  I have reviewed the data as listed  . CBC Latest Ref Rng & Units 08/20/2018 05/01/2018 04/17/2018  WBC 4.0 - 10.5 K/uL 10.1 9.1 9.6  Hemoglobin 13.0 - 17.0 g/dL 15.0 13.9 13.2  Hematocrit 39.0 - 52.0 % 46.8 43.1 41.4  Platelets 150 - 400 K/uL 267 280 258  ANC 300  . CMP Latest Ref Rng & Units 04/17/2018 03/10/2018 02/23/2018  Glucose 70 - 99 mg/dL 212(H) 232(H) 302(H)  BUN 6 - 20 mg/dL 23(H) 16 12  Creatinine 0.61 - 1.24 mg/dL 1.05 0.74 0.78  Sodium 135 - 145 mmol/L 136 137 135  Potassium 3.5 - 5.1 mmol/L 4.4 4.3 5.0   Chloride 98 - 111 mmol/L 100 103 98  CO2 22 - 32 mmol/L 22  24 27  Calcium 8.9 - 10.3 mg/dL 9.9 8.7(L) 9.1  Total Protein 6.5 - 8.1 g/dL 7.7 6.2(L) 6.4(L)  Total Bilirubin 0.3 - 1.2 mg/dL 0.5 0.3 0.5  Alkaline Phos 38 - 126 U/L 121 155(H) 144(H)  AST 15 - 41 U/L 15 15 16   ALT 0 - 44 U/L 19 23 35    Pathology:   11/25/17 Tissue Flow Cytometry:    RADIOGRAPHIC STUDIES: I have personally reviewed the radiological images as listed and agreed with the findings in the report. No results found.  ASSESSMENT & PLAN:   58 y.o. male with  1. 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 Prostate enlargement.  11/17/17 PET/CT revealed Isolated hypermetabolic retroperitoneal lymphadenopathy. Findings suspicious for lymphoma as no primary lesion is identified to suggest this is metastatic adenopathy. Recommend retroperitoneal biopsy. The largest left-sided node appears necrotic and is actually decreased in size since the prior CT scan. It appeared inflamed on the prior study and may have infarcted. I would not recommend biopsying this lesion. No adenopathy in the neck, axilla, chest, pelvis or inguinal regions. No worrisome pulmonary lesions or osseous lesions.  11/25/17 pathology results revealed  finding of metastatic seminoma    12/04/17 US Scrotum w/doppler revealed Negative scrotal ultrasound with no distinct testicular mass Identified. 2. 5 mm simple left epididymal cyst, almost certainly benign and felt to be incidental in nature.   S/p 4 cycles of EP treatment, tolerated well overall  04/09/18 CT C/A/P revealed "no metastatic adenopathy in the chest. Abdomen/Pelvis: 1. Interval reduction in volume of LEFT periaortic metastatic lymph node. 2. No new adenopathy in the  abdomen pelvis. 3. Benign-appearing splenic in LEFT renal cystic lesions."  2. Chemotherapy related neutropenia -- resolved.  PLAN:  -Discussed pt labwork today, 08/20/18; blood counts are normal -08/20/18 AFP tumor marker, LDH and Beta HCG quant are wnl -The pt shows no clinical or lab progression/return of his extranodal seminoma at this time.  -No indication for further treatment at this time. -Recommend staying up to date with annual flu vaccine, and both pneumonia vaccines every 5 years -Recommended that the pt continue to eat well, drink at least 48-64 oz of water each day, and walk 20-30 minutes each day. -Will see the pt back in 4 months with CT imaging prior for interval study   CT chest/abd/pelvis in 14 weeks Labs in 14 weeks RTC with Dr Irene Limbo in 16 weeks   All of the patients questions were answered with apparent satisfaction. The patient knows to call the clinic with any problems, questions or concerns.  The total time spent in the appt was 20 minutes and more than 50% was on counseling and direct patient cares.    Neil Lone MD MS AAHIVMS [facility] Hematology/Oncology Physician [facility]  (Office):       [phone] (Work cell):  [phone] (Fax):           [phone]  08/20/2018 3:05 PM  I, Neil Berg, am acting as a scribe for Dr. Sullivan Berg.   .I have reviewed the above documentation for accuracy and completeness, and I agree with the above. Neil Genera MD

## 2018-08-20 ENCOUNTER — Other Ambulatory Visit: Payer: Self-pay

## 2018-08-20 ENCOUNTER — Other Ambulatory Visit: Payer: Self-pay | Admitting: *Deleted

## 2018-08-20 ENCOUNTER — Telehealth: Payer: Self-pay | Admitting: Hematology

## 2018-08-20 ENCOUNTER — Inpatient Hospital Stay: Payer: BLUE CROSS/BLUE SHIELD | Attending: Hematology

## 2018-08-20 ENCOUNTER — Inpatient Hospital Stay: Payer: BLUE CROSS/BLUE SHIELD | Admitting: Hematology

## 2018-08-20 VITALS — BP 161/75 | HR 100 | Temp 98.3°F | Resp 18 | Ht 72.0 in | Wt 299.1 lb

## 2018-08-20 DIAGNOSIS — Z79899 Other long term (current) drug therapy: Secondary | ICD-10-CM

## 2018-08-20 DIAGNOSIS — C6290 Malignant neoplasm of unspecified testis, unspecified whether descended or undescended: Secondary | ICD-10-CM | POA: Insufficient documentation

## 2018-08-20 DIAGNOSIS — E119 Type 2 diabetes mellitus without complications: Secondary | ICD-10-CM | POA: Insufficient documentation

## 2018-08-20 DIAGNOSIS — E669 Obesity, unspecified: Secondary | ICD-10-CM | POA: Insufficient documentation

## 2018-08-20 DIAGNOSIS — R590 Localized enlarged lymph nodes: Secondary | ICD-10-CM | POA: Diagnosis not present

## 2018-08-20 DIAGNOSIS — Z9221 Personal history of antineoplastic chemotherapy: Secondary | ICD-10-CM

## 2018-08-20 DIAGNOSIS — N400 Enlarged prostate without lower urinary tract symptoms: Secondary | ICD-10-CM | POA: Diagnosis not present

## 2018-08-20 DIAGNOSIS — I10 Essential (primary) hypertension: Secondary | ICD-10-CM | POA: Diagnosis not present

## 2018-08-20 DIAGNOSIS — Z7982 Long term (current) use of aspirin: Secondary | ICD-10-CM | POA: Insufficient documentation

## 2018-08-20 LAB — CBC WITH DIFFERENTIAL/PLATELET
Abs Immature Granulocytes: 0.02 10*3/uL (ref 0.00–0.07)
Basophils Absolute: 0 10*3/uL (ref 0.0–0.1)
Basophils Relative: 0 %
Eosinophils Absolute: 0.2 10*3/uL (ref 0.0–0.5)
Eosinophils Relative: 2 %
HCT: 46.8 % (ref 39.0–52.0)
Hemoglobin: 15 g/dL (ref 13.0–17.0)
Immature Granulocytes: 0 %
Lymphocytes Relative: 24 %
Lymphs Abs: 2.4 10*3/uL (ref 0.7–4.0)
MCH: 26.6 pg (ref 26.0–34.0)
MCHC: 32.1 g/dL (ref 30.0–36.0)
MCV: 83 fL (ref 80.0–100.0)
Monocytes Absolute: 0.8 10*3/uL (ref 0.1–1.0)
Monocytes Relative: 8 %
Neutro Abs: 6.7 10*3/uL (ref 1.7–7.7)
Neutrophils Relative %: 66 %
Platelets: 267 10*3/uL (ref 150–400)
RBC: 5.64 MIL/uL (ref 4.22–5.81)
RDW: 13.9 % (ref 11.5–15.5)
WBC: 10.1 10*3/uL (ref 4.0–10.5)
nRBC: 0 % (ref 0.0–0.2)

## 2018-08-20 LAB — CMP (CANCER CENTER ONLY)
ALT: 29 U/L (ref 0–44)
AST: 19 U/L (ref 15–41)
Albumin: 4.4 g/dL (ref 3.5–5.0)
Alkaline Phosphatase: 136 U/L — ABNORMAL HIGH (ref 38–126)
Anion gap: 12 (ref 5–15)
BUN: 18 mg/dL (ref 6–20)
CO2: 27 mmol/L (ref 22–32)
Calcium: 9.3 mg/dL (ref 8.9–10.3)
Chloride: 97 mmol/L — ABNORMAL LOW (ref 98–111)
Creatinine: 0.84 mg/dL (ref 0.61–1.24)
GFR, Est AFR Am: >60 mL/min (ref >60)
GFR, Estimated: >60 mL/min (ref >60)
Glucose, Bld: 128 mg/dL — ABNORMAL HIGH (ref 70–99)
Potassium: 4.3 mmol/L (ref 3.5–5.1)
Sodium: 136 mmol/L (ref 135–145)
Total Bilirubin: 0.4 mg/dL (ref 0.3–1.2)
Total Protein: 7.3 g/dL (ref 6.5–8.1)

## 2018-08-20 LAB — LACTATE DEHYDROGENASE: LDH: 193 U/L — ABNORMAL HIGH (ref 98–192)

## 2018-08-20 NOTE — Telephone Encounter (Signed)
Scheduled appt per 5/7 los. °

## 2018-08-21 LAB — BETA HCG QUANT (REF LAB): hCG Quant: <1 m[IU]/mL (ref 0–3)

## 2018-08-21 LAB — AFP TUMOR MARKER: AFP, Serum, Tumor Marker: 2.2 ng/mL (ref 0.0–8.3)

## 2018-09-21 DIAGNOSIS — H40013 Open angle with borderline findings, low risk, bilateral: Secondary | ICD-10-CM | POA: Diagnosis not present

## 2018-10-01 IMAGING — CT CT BIOPSY
1 of 3 series · 13 of 32 positions shown, 19 images · non-contrast
Comparison: none

INDICATION: Lymphoma, enlarging metabolic left retroperitoneal para-aortic lymph
node

[Series 2: i-spiral 5.0 b31f · axial · 0.98mm/px · z∈[-156,-6]mm · 13 of 51 slices shown, 19 images]
[im 4/51  soft-tissue]
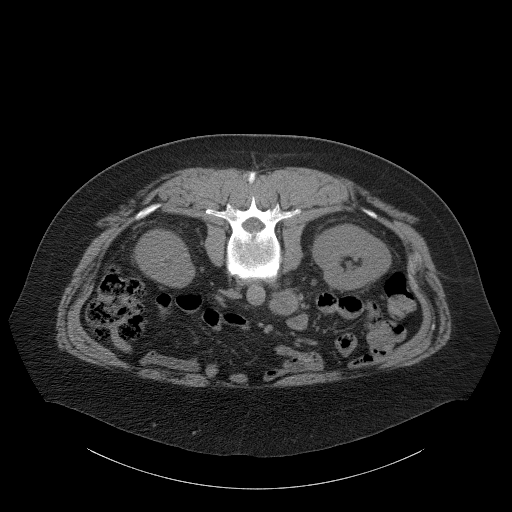
[im 4/51  bone]
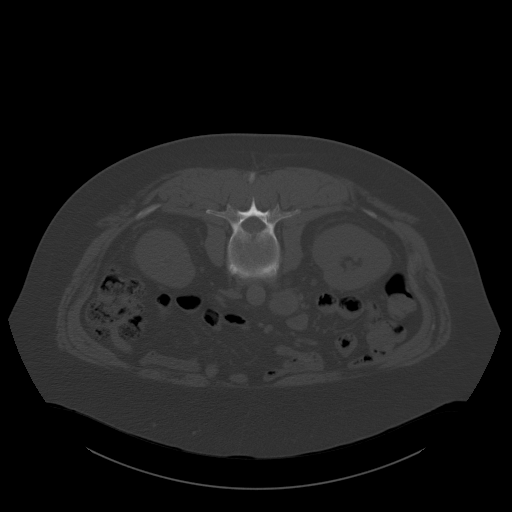
[im 7/51  soft-tissue]
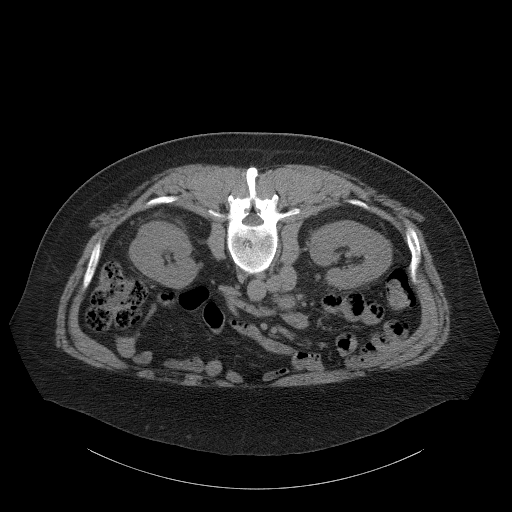
[im 11/51  soft-tissue]
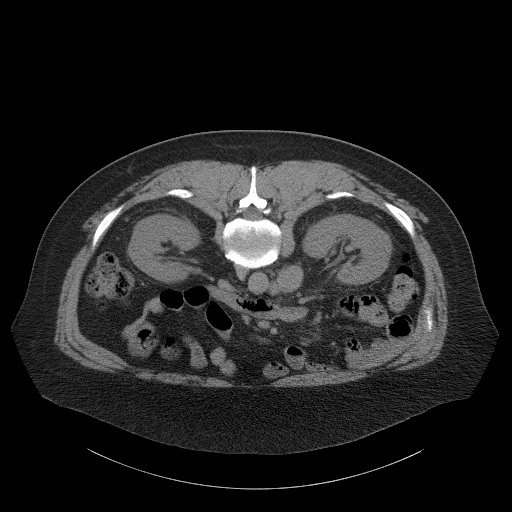
[im 14/51  soft-tissue]
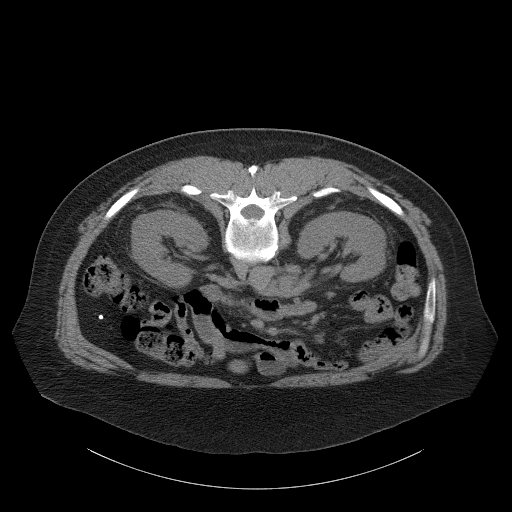
[im 17/51  soft-tissue]
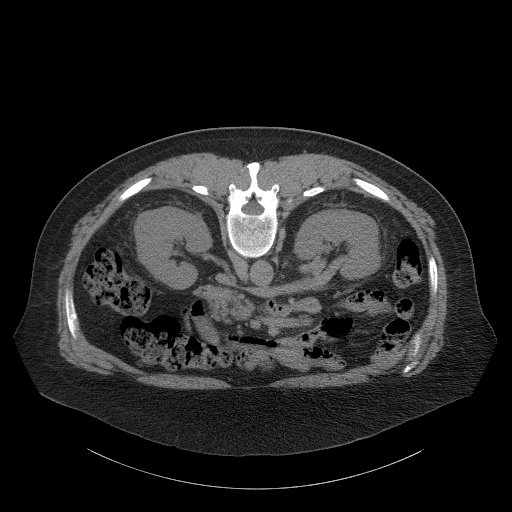
[im 21/51  soft-tissue]
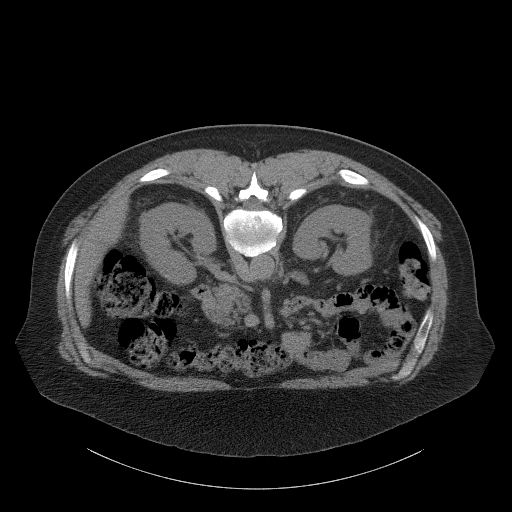
[im 27/51  soft-tissue]
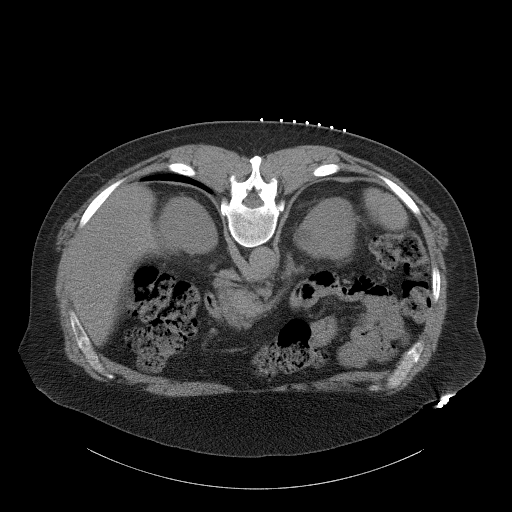
[im 31/51  soft-tissue]
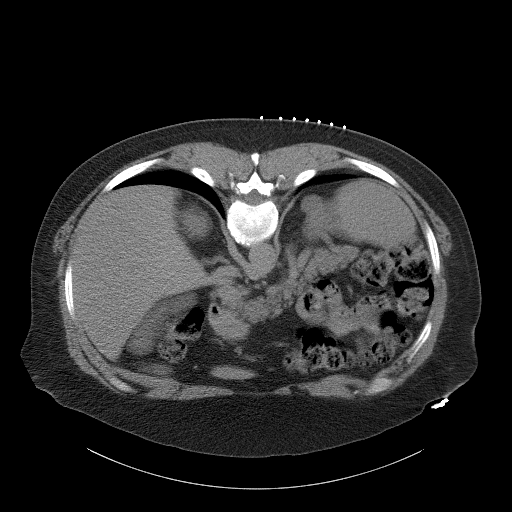
[im 34/51  soft-tissue]
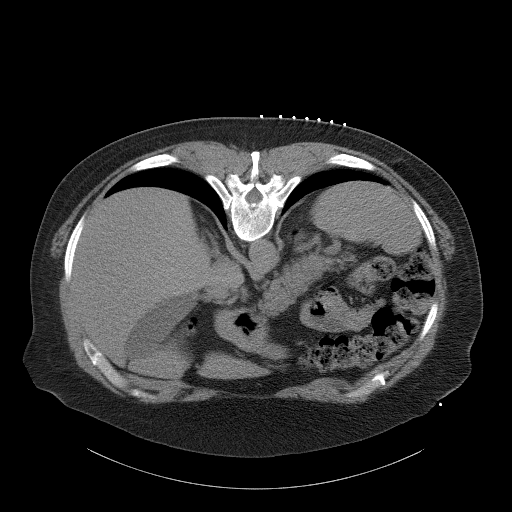
[im 34/51  bone]
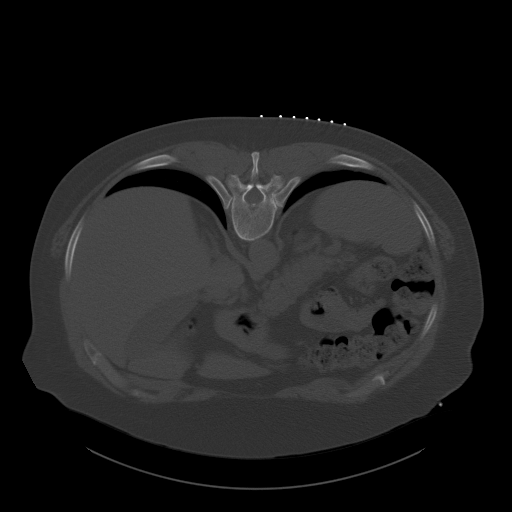
[im 37/51  soft-tissue]
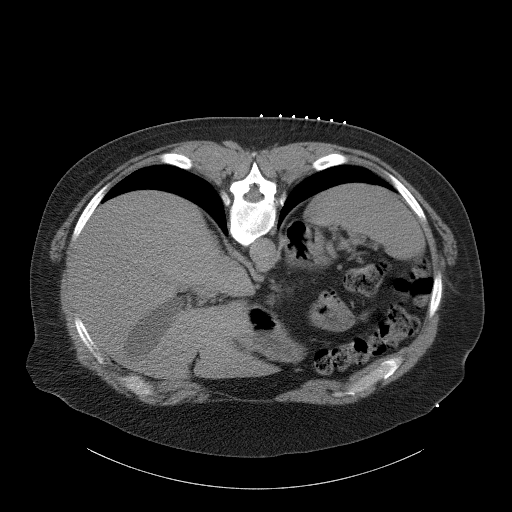
[im 37/51  lung]
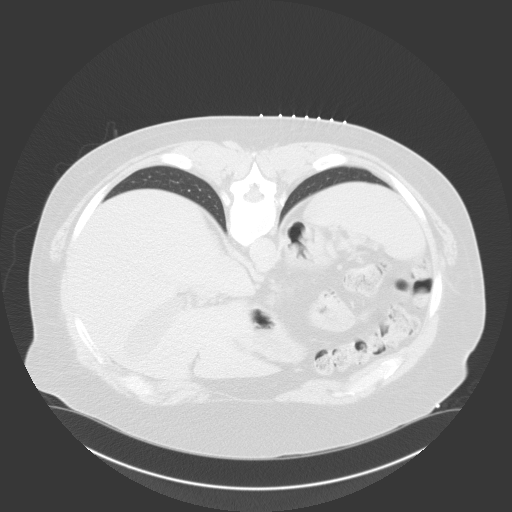
[im 41/51  soft-tissue]
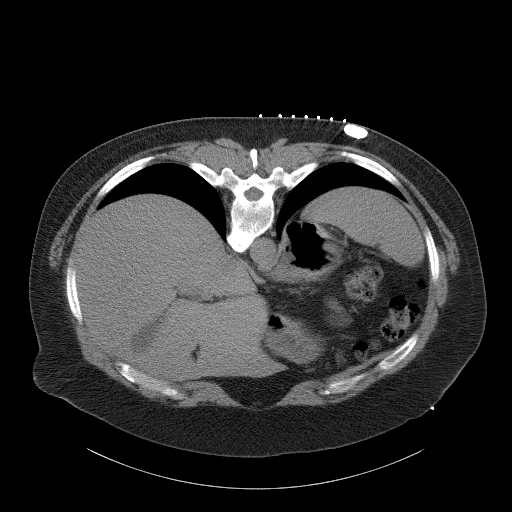
[im 41/51  lung]
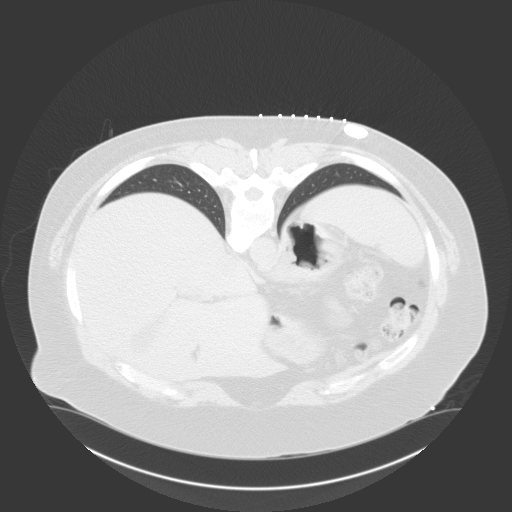
[im 44/51  soft-tissue]
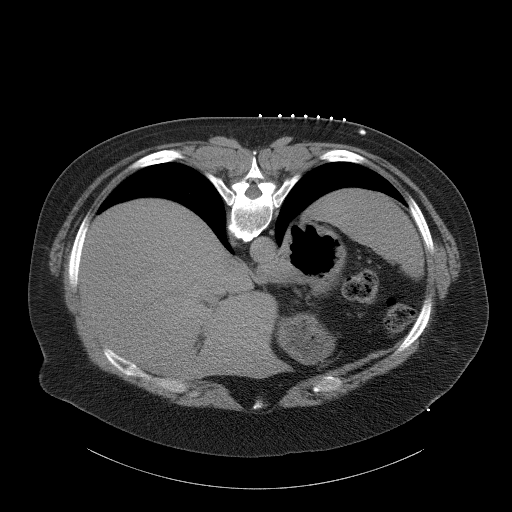
[im 44/51  lung]
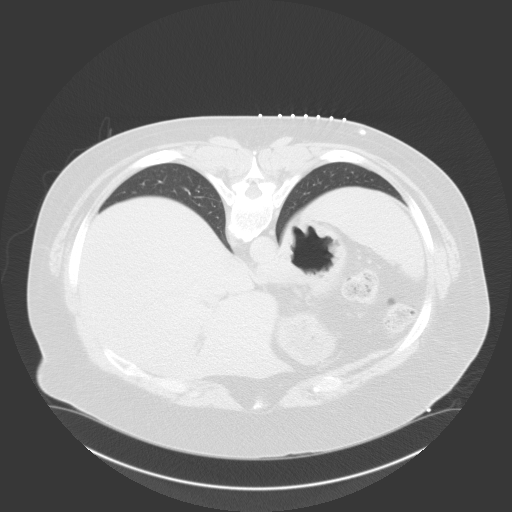
[im 47/51  soft-tissue]
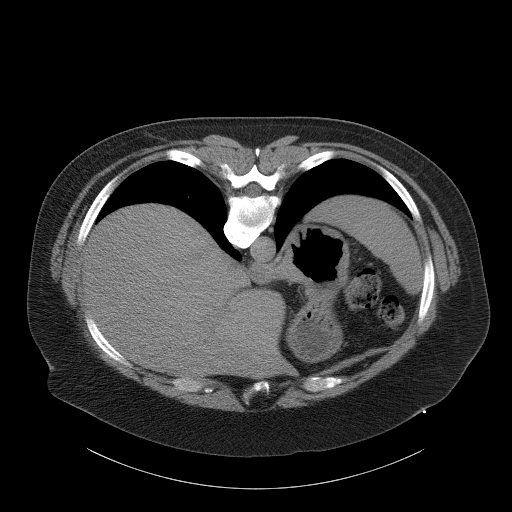
[im 47/51  lung]
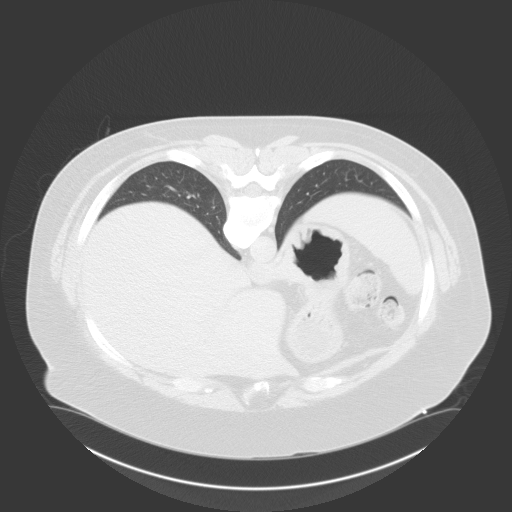

[13 of 32 positions shown; findings below may reference images not displayed]

EXAM:
CT BIOPSY LEFT PERIAORTIC ADENOPATHY

MEDICATIONS:
1% LIDOCAINE LOCAL

ANESTHESIA/SEDATION:
Moderate (conscious) sedation was employed during this procedure. A
total of Versed 2.0 mg and Fentanyl 100 mcg was administered
intravenously.

Moderate Sedation Time: 15 minutes. The patient's level of
consciousness and vital signs were monitored continuously by
radiology nursing throughout the procedure under my direct
supervision.

FLUOROSCOPY TIME:  Fluoroscopy Time: NONE.

COMPLICATIONS:
None immediate.

PROCEDURE:
Informed written consent was obtained from the patient after a
thorough discussion of the procedural risks, benefits and
alternatives. All questions were addressed. Maximal Sterile Barrier
Technique was utilized including caps, mask, sterile gowns, sterile
gloves, sterile drape, hand hygiene and skin antiseptic. A timeout
was performed prior to the initiation of the procedure.

Previous imaging reviewed. Patient positioned prone. Noncontrast
localization CT performed. The left periaortic adenopathy just
inferior to the left renal artery was localized and correlated with
the PET scan. Overlying skin marked for a posterior paraspinous
approach.

Under sterile conditions and local anesthesia, a 17 gauge 16.8 cm
access needle was advanced from a posterior approach to the lymph
node. 18 gauge core biopsies obtained. Samples placed in saline.
Postprocedure imaging demonstrates no hemorrhage or hematoma.
Patient tolerated the biopsy well.
IMPRESSION: Successful CT-guided left periaortic adenopathy 18 gauge core
biopsies

## 2018-11-17 DIAGNOSIS — H25041 Posterior subcapsular polar age-related cataract, right eye: Secondary | ICD-10-CM | POA: Diagnosis not present

## 2018-11-17 DIAGNOSIS — H2511 Age-related nuclear cataract, right eye: Secondary | ICD-10-CM | POA: Diagnosis not present

## 2018-11-17 DIAGNOSIS — Z961 Presence of intraocular lens: Secondary | ICD-10-CM | POA: Diagnosis not present

## 2018-11-17 DIAGNOSIS — H18413 Arcus senilis, bilateral: Secondary | ICD-10-CM | POA: Diagnosis not present

## 2018-11-26 ENCOUNTER — Other Ambulatory Visit: Payer: Self-pay

## 2018-11-26 ENCOUNTER — Ambulatory Visit (HOSPITAL_COMMUNITY)
Admission: RE | Admit: 2018-11-26 | Discharge: 2018-11-26 | Disposition: A | Discharge status: Home / Self Care | Payer: BC Managed Care – PPO | Source: Ambulatory Visit | Attending: Hematology | Admitting: Hematology

## 2018-11-26 ENCOUNTER — Inpatient Hospital Stay: Payer: BC Managed Care – PPO | Attending: Hematology

## 2018-11-26 DIAGNOSIS — N281 Cyst of kidney, acquired: Secondary | ICD-10-CM | POA: Diagnosis not present

## 2018-11-26 DIAGNOSIS — C6290 Malignant neoplasm of unspecified testis, unspecified whether descended or undescended: Secondary | ICD-10-CM | POA: Insufficient documentation

## 2018-11-26 DIAGNOSIS — N401 Enlarged prostate with lower urinary tract symptoms: Secondary | ICD-10-CM | POA: Diagnosis not present

## 2018-11-26 DIAGNOSIS — Z79899 Other long term (current) drug therapy: Secondary | ICD-10-CM | POA: Diagnosis not present

## 2018-11-26 DIAGNOSIS — Z7982 Long term (current) use of aspirin: Secondary | ICD-10-CM | POA: Diagnosis not present

## 2018-11-26 DIAGNOSIS — E119 Type 2 diabetes mellitus without complications: Secondary | ICD-10-CM | POA: Insufficient documentation

## 2018-11-26 DIAGNOSIS — Z8547 Personal history of malignant neoplasm of testis: Secondary | ICD-10-CM | POA: Insufficient documentation

## 2018-11-26 DIAGNOSIS — I10 Essential (primary) hypertension: Secondary | ICD-10-CM | POA: Insufficient documentation

## 2018-11-26 LAB — CBC WITH DIFFERENTIAL/PLATELET
Abs Immature Granulocytes: 0.01 10*3/uL (ref 0.00–0.07)
Basophils Absolute: 0 10*3/uL (ref 0.0–0.1)
Basophils Relative: 0 %
Eosinophils Absolute: 0.2 10*3/uL (ref 0.0–0.5)
Eosinophils Relative: 2 %
HCT: 47.3 % (ref 39.0–52.0)
Hemoglobin: 15.5 g/dL (ref 13.0–17.0)
Immature Granulocytes: 0 %
Lymphocytes Relative: 26 %
Lymphs Abs: 2.9 10*3/uL (ref 0.7–4.0)
MCH: 27.4 pg (ref 26.0–34.0)
MCHC: 32.8 g/dL (ref 30.0–36.0)
MCV: 83.6 fL (ref 80.0–100.0)
Monocytes Absolute: 0.8 10*3/uL (ref 0.1–1.0)
Monocytes Relative: 7 %
Neutro Abs: 7.2 10*3/uL (ref 1.7–7.7)
Neutrophils Relative %: 65 %
Platelets: 264 10*3/uL (ref 150–400)
RBC: 5.66 MIL/uL (ref 4.22–5.81)
RDW: 13.6 % (ref 11.5–15.5)
WBC: 11.1 10*3/uL — ABNORMAL HIGH (ref 4.0–10.5)
nRBC: 0 % (ref 0.0–0.2)

## 2018-11-26 LAB — CMP (CANCER CENTER ONLY)
ALT: 21 U/L (ref 0–44)
AST: 16 U/L (ref 15–41)
Albumin: 4.6 g/dL (ref 3.5–5.0)
Alkaline Phosphatase: 121 U/L (ref 38–126)
Anion gap: 12 (ref 5–15)
BUN: 20 mg/dL (ref 6–20)
CO2: 24 mmol/L (ref 22–32)
Calcium: 9.9 mg/dL (ref 8.9–10.3)
Chloride: 100 mmol/L (ref 98–111)
Creatinine: 1 mg/dL (ref 0.61–1.24)
GFR, Est AFR Am: >60 mL/min (ref >60)
GFR, Estimated: >60 mL/min (ref >60)
Glucose, Bld: 158 mg/dL — ABNORMAL HIGH (ref 70–99)
Potassium: 4.2 mmol/L (ref 3.5–5.1)
Sodium: 136 mmol/L (ref 135–145)
Total Bilirubin: 0.4 mg/dL (ref 0.3–1.2)
Total Protein: 7.8 g/dL (ref 6.5–8.1)

## 2018-11-26 LAB — LACTATE DEHYDROGENASE: LDH: 165 U/L (ref 98–192)

## 2018-11-26 MED ORDER — SODIUM CHLORIDE (PF) 0.9 % IJ SOLN
INTRAMUSCULAR | Status: AC
  Filled 2018-11-26: qty 50

## 2018-11-26 MED ORDER — IOHEXOL 300 MG/ML  SOLN
100.0000 mL | Freq: Once | INTRAMUSCULAR | Status: AC | PRN
  Administered 2018-11-26: 100 mL via INTRAVENOUS

## 2018-11-27 LAB — BETA HCG QUANT (REF LAB): hCG Quant: <1 m[IU]/mL (ref 0–3)

## 2018-11-27 LAB — AFP TUMOR MARKER: AFP, Serum, Tumor Marker: 2 ng/mL (ref 0.0–8.3)

## 2018-12-07 DIAGNOSIS — H2511 Age-related nuclear cataract, right eye: Secondary | ICD-10-CM | POA: Diagnosis not present

## 2018-12-07 DIAGNOSIS — H25811 Combined forms of age-related cataract, right eye: Secondary | ICD-10-CM | POA: Diagnosis not present

## 2018-12-08 NOTE — Progress Notes (Signed)
HEMATOLOGY/ONCOLOGY CLNIC NOTE  Date of Service: 12/08/2018  Patient Care Team: Antony Contras, MD as PCP - General (Family Medicine)  CHIEF COMPLAINTS/PURPOSE OF CONSULTATION:  F/u for extragondal seminoma  HISTORY OF PRESENTING ILLNESS:   Neil Berg is a wonderful 58 y.o. male who has been referred to [place] by Dr. Antony Contras for evaluation and management of Lymphadenopathy. He is accompanied today by his wife. The pt reports that he is doing well overall and [place]   The pt reports that he hasn't had any pain in over a week and recently appeared to [facility] on 10/17/17. He notes that he woke up on the morning of 10/17/17 with significant, unique, back and kidney pain which began to spread around to the RUQ of his abdomen as the morning progressed. He notes that his pain went away after taking pain medications for 5 days, and describes that the pain was waxing and waning. He also endorses some positional exacerbation to his pain. He denies any nausea, vomiting, diarrhea, changes in bowel habits, testicular pain or swelling, difficulty/changes in urination at the time or since then.    The pt also notes that  In the week of these symptoms he felt very cold, which is different for him as he describes himself as very warm natured. He also notes that his appetite weakened and he lost about 10 pounds, and was abiding by the [employer]. He notes that his appetite has returned and he is eating normally again. He denies any pain associated with meals. He denies recent exotic or international travel  He takes Jardiance for his DM. He notes that his PSA spiked two years ago, had a biopsy, and his PSA counts have decreased recently and is believed to be BPH.    He also notes that he had a recurring rash on his calves, worse on the left, that was red and flat, mimicking his sock impression, but extending further up his leg. He notes that this happened over  3 months, and that it came and went without any discernable pattern. He denies any associated itching.   He also notes a place on the front of his left lower leg that has been slightly swollen for the past a week. He denies remembering running into anything, bruising, or pain.   Of note prior to the patient's visit today, pt has had CT A/P completed on 10/14/17 with results revealing Retroperitoneal lymphadenopathy, highly suspicious for lymphoma or metastatic disease. 2. Prostate enlargement 3.  Aortic Atherosclerosis.   Most recent lab results (10/17/17) of CBC w/diff is as follows: all values are WNL except for WBC at 13.9k, RBC at 5.83, ANC at 10.0k, Monocytes abs at 1.4k.  On review of systems, pt reports recent back pain, feeling cold recently, recent abdominal pain, and denies nausea, vomiting, diarrhea, changes in bowel habits, difficulty/changes in urination, blood in the urine, abdominal trauma, constipation, fevers, night sweats, chills, testicular pain or swelling, noticing any lumps or bumps, unexpected weight loss, and any other symptoms.   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  On Social Hx the pt reports working in [employer] and denies chemical/radiaiton exposure. He denies ever smoking and other drug use.  Interval History:   Neil Berg returns today for management, evaluation after completing 4 cycles of EP  treatment of his Stage II extra-gonadal retroperitoneal Seminoma. The patient's last visit with [place] was on 08/20/2018. The pt reports that he is doing well overall.  The pt reports some mild fatigue that sometimes keeps his from accomplishing tasks throughout the day such as yard work and walking. Overall, he feels 80% back to normal.  The pt reports that he has an episode of left cervical LNadenopathy. Denies ear or dental issues. He notes  that he had surgery for cataracts this week, which drastically improved his vision.  Of note since the patient's last visit, pt has had CT CAP completed on 11/26/2018 with results revealing "1. Stable mild left abdominal retroperitoneal lymphadenopathy. 2. No new or progressive metastatic disease within the chest, abdomen, or pelvis. 3. Stable mildly enlarged prostate."  Most recent labs form 11/26/2018 are included in the "laboratory data" section  On review of systems, pt reports mild fatigue and denies belly pain, changes in bowel movements or urination, any other symptoms.   MEDICAL HISTORY:  Past Medical History:  Diagnosis Date   Chest pain    Diabetes mellitus without complication ([place])    Hypertension    Obesity   Type II Diabetes Mellitus   SURGICAL HISTORY: Past Surgical History:  Procedure Laterality Date   EYE SURGERY     left eye cataract removal / implant    IR IMAGING GUIDED PORT INSERTION  12/11/2017   IR REMOVAL TUN ACCESS W/ PORT W/O FL MOD SED  05/01/2018   TONSILLECTOMY      Tonsillectomy 1974 Cataract surgery-left eye 04/2015 Colonoscopy 06/04/16  SOCIAL HISTORY: Social History   Socioeconomic History   Marital status: Married    Spouse name: Not on file   Number of children: Not on file   Years of education: Not on file   Highest education level: Not on file  Occupational History   Not on file  Social Needs   Financial resource strain: Not on file   Food insecurity    Worry: Not on file    Inability: Not on file   Transportation needs    Medical: Not on file    Non-medical: Not on file  Tobacco Use   Smoking status: Never Smoker   Smokeless tobacco: Never Used  Substance and Sexual Activity   Alcohol use: No   Drug use: No   Sexual activity: Not on file  Lifestyle   Physical activity    Days per week: Not on file    Minutes per session: Not on file   Stress: Not on file  Relationships   Social connections     Talks on phone: Not on file    Gets together: Not on file    Attends religious service: Not on file    Active member of club or organization: Not on file    Attends meetings of clubs or organizations: Not on file    Relationship status: Not on file   Intimate partner violence    Fear of current or ex partner: Not on file    Emotionally abused: Not on file    Physically abused: Not on file    Forced sexual activity: Not on file  Other Topics Concern   Not on file  Social History Narrative   Not on file    FAMILY HISTORY: Family History  Problem Relation Age of Onset   Heart disease Maternal Grandmother    Heart attack Maternal Grandfather     ALLERGIES:  has No Known Allergies.  MEDICATIONS:  Current Outpatient Medications  Medication Sig Dispense Refill   amLODipine (NORVASC) 5 MG tablet Take 1 tablet (5 mg total) by mouth daily. 30 tablet 11   amoxicillin-clavulanate (AUGMENTIN) 875-125 MG tablet Take 1 tablet by mouth 2 (two) times daily. 20 tablet 0   aspirin EC 81 MG tablet Take 81 mg by mouth daily.     b complex vitamins tablet Take 1 tablet by mouth daily.     dexamethasone (DECADRON) 4 MG tablet Take 2 tablets by mouth once a day on the day starting on day 6 of chemotherapy and then take 2 tablets two times a day for 2 days. 30 tablet 1   HYDROcodone-acetaminophen (NORCO/VICODIN) 5-325 MG tablet Take 1 tablet by mouth every 6 (six) hours as needed for moderate pain or severe pain. 30 tablet 0   JARDIANCE 25 MG TABS tablet TK 1 T PO QAM  0   lidocaine-prilocaine (EMLA) cream Apply to affected area once 30 g 3   lisinopril (PRINIVIL,ZESTRIL) 20 MG tablet TAKE 1 TABLET BY MOUTH EVERY DAY. DISCONTINUE LISINOPRIL/HCTZ COMBINATION 30 tablet 0   LORazepam (ATIVAN) 0.5 MG tablet Take 1 tablet (0.5 mg total) by mouth every 6 (six) hours as needed (Nausea or vomiting). 30 tablet 0   metoCLOPramide (REGLAN) 10 MG tablet Take 1 tablet (10 mg total) by mouth every 8  (eight) hours as needed (nausea and hiccups). 20 tablet 0   ondansetron (ZOFRAN) 8 MG tablet Take 1 tablet (8 mg total) by mouth 2 (two) times daily as needed. Start on day 8 of chemotherapy. 30 tablet 1   pantoprazole (PROTONIX) 40 MG tablet TAKE 1 TABLET(40 MG) BY MOUTH DAILY 30 tablet 0   prochlorperazine (COMPAZINE) 10 MG tablet Take 1 tablet (10 mg total) by mouth every 6 (six) hours as needed (Nausea or vomiting). 30 tablet 1   No current facility-administered medications for this visit.     REVIEW OF SYSTEMS:    A 10+ POINT REVIEW OF SYSTEMS WAS OBTAINED including neurology, dermatology, psychiatry, cardiac, respiratory, lymph, extremities, GI, GU, Musculoskeletal, constitutional, breasts, reproductive, HEENT.  All pertinent positives are noted in the HPI.  All others are negative.   PHYSICAL EXAMINATION: ECOG PERFORMANCE STATUS: 1 - Symptomatic but completely ambulatory   GENERAL:alert, in no acute distress and comfortable SKIN: no acute rashes, no significant lesions EYES: conjunctiva are pink and non-injected, sclera anicteric OROPHARYNX: MMM, no exudates, no oropharyngeal erythema or ulceration NECK: supple, no JVD LYMPH:  no palpable lymphadenopathy in the cervical, axillary or inguinal regions LUNGS: clear to auscultation b/l with normal respiratory effort HEART: regular rate & rhythm ABDOMEN:  normoactive bowel sounds , non tender, not distended. No palpable hepatosplenomegaly.  Extremity: no pedal edema PSYCH: alert & oriented x 3 with fluent speech NEURO: no focal motor/sensory deficits   LABORATORY DATA:  I have reviewed the data as listed  . CBC Latest Ref Rng & Units 11/26/2018 08/20/2018 05/01/2018  WBC 4.0 - 10.5 K/uL 11.1(H) 10.1 9.1  Hemoglobin 13.0 - 17.0 g/dL 15.5 15.0 13.9  Hematocrit 39.0 - 52.0 % 47.3 46.8 43.1  Platelets 150 - 400 K/uL 264 267 280  ANC 300  . CMP Latest Ref Rng & Units 11/26/2018 08/20/2018 04/17/2018  Glucose 70 - 99 mg/dL 158(H)  128(H) 212(H)  BUN 6 - 20 mg/dL 20 18 23(H)  Creatinine 0.61 - 1.24 mg/dL 1.00 0.84 1.05  Sodium 135 - 145 mmol/L 136 136 136  Potassium 3.5 - 5.1 mmol/L 4.2  4.3 4.4  Chloride 98 - 111 mmol/L 100 97(L) 100  CO2 22 - 32 mmol/L 24 27 22   Calcium 8.9 - 10.3 mg/dL 9.9 9.3 9.9  Total Protein 6.5 - 8.1 g/dL 7.8 7.3 7.7  Total Bilirubin 0.3 - 1.2 mg/dL 0.4 0.4 0.5  Alkaline Phos 38 - 126 U/L 121 136(H) 121  AST 15 - 41 U/L 16 19 15   ALT 0 - 44 U/L 21 29 19     Pathology:   11/25/17 Tissue Flow Cytometry:      11/26/2018 LABS Component     Latest Ref Rng & Units 11/26/2018  WBC     4.0 - 10.5 K/uL 11.1 (H)  RBC     4.22 - 5.81 MIL/uL 5.66  Hemoglobin     13.0 - 17.0 g/dL 15.5  HCT     39.0 - 52.0 % 47.3  MCV     80.0 - 100.0 fL 83.6  MCH     26.0 - 34.0 pg 27.4  MCHC     30.0 - 36.0 g/dL 32.8  RDW     11.5 - 15.5 % 13.6  Platelets     150 - 400 K/uL 264  nRBC     0.0 - 0.2 % 0.0  Neutrophils     % 65  NEUT#     1.7 - 7.7 K/uL 7.2  Lymphocytes     % 26  Lymphocyte #     0.7 - 4.0 K/uL 2.9  Monocytes Relative     % 7  Monocyte #     0.1 - 1.0 K/uL 0.8  Eosinophil     % 2  Eosinophils Absolute     0.0 - 0.5 K/uL 0.2  Basophil     % 0  Basophils Absolute     0.0 - 0.1 K/uL 0.0  Immature Granulocytes     % 0  Abs Immature Granulocytes     0.00 - 0.07 K/uL 0.01  Sodium     135 - 145 mmol/L 136  Potassium     3.5 - 5.1 mmol/L 4.2  Chloride     98 - 111 mmol/L 100  CO2     22 - 32 mmol/L 24  Glucose     70 - 99 mg/dL 158 (H)  BUN     6 - 20 mg/dL 20  Creatinine     0.61 - 1.24 mg/dL 1.00  Calcium     8.9 - 10.3 mg/dL 9.9  Total Protein     6.5 - 8.1 g/dL 7.8  Albumin     3.5 - 5.0 g/dL 4.6  AST     15 - 41 U/L 16  ALT     0 - 44 U/L 21  Alkaline Phosphatase     38 - 126 U/L 121  Total Bilirubin     0.3 - 1.2 mg/dL 0.4  GFR, Est Non African American     >60 mL/min >60  GFR, Est African American     >60 mL/min >60  Anion gap     5 - 15  12  LDH     98 - 192 U/L 165  AFP, Serum, Tumor Marker     0.0 - 8.3 ng/mL 2.0  hCG Quant     0 - 3 mIU/mL <1    RADIOGRAPHIC STUDIES: I have personally reviewed the radiological images as listed and agreed with the findings in the report. Ct Chest W Contrast  Result Date: 11/26/2018  CLINICAL DATA:  Follow-up metastatic testicular seminoma. Status post chemotherapy. EXAM: CT CHEST, ABDOMEN, AND PELVIS WITH CONTRAST TECHNIQUE: Multidetector CT imaging of the chest, abdomen and pelvis was performed following the standard protocol during bolus administration of intravenous contrast. CONTRAST:  112mL OMNIPAQUE IOHEXOL 300 MG/ML  SOLN COMPARISON:  04/09/2018 FINDINGS: CT CHEST FINDINGS Cardiovascular: No acute findings. Aortic and coronary artery atherosclerosis. Mediastinum/Lymph Nodes: No masses or pathologically enlarged lymph nodes identified. Lungs/Pleura: No pulmonary infiltrate or mass identified. No effusion present. Musculoskeletal:  No suspicious bone lesions identified. CT ABDOMEN AND PELVIS FINDINGS Hepatobiliary: No masses identified. Gallbladder is unremarkable. No evidence of biliary ductal dilatation. Pancreas:  No mass or inflammatory changes. Spleen: Within normal limits in size. Stable small cystic lesion in the superior aspect of spleen, consistent with benign etiology. Adrenals/Urinary tract: Stable small bilateral renal cysts. No masses or hydronephrosis. Stomach/Bowel: No evidence of obstruction, inflammatory process, or abnormal fluid collections. Normal appendix visualized. Vascular/Lymphatic: Stable mild left paraaortic lymphadenopathy with lymph node measuring 2.0 cm in short axis on image 78/2. No other sites of lymphadenopathy identified. Aortic atherosclerosis. Reproductive: Stable mildly enlarged prostate. Other:  None. Musculoskeletal:  No suspicious bone lesions identified. IMPRESSION: 1. Stable mild left abdominal retroperitoneal lymphadenopathy. 2. No new or progressive  metastatic disease within the chest, abdomen, or pelvis. 3. Stable mildly enlarged prostate. Aortic Atherosclerosis (ICD10-I70.0). Coronary artery atherosclerosis. Electronically Signed   By: Marlaine Hind M.D.   On: 11/26/2018 15:04   Ct Abdomen Pelvis W Contrast  Result Date: 11/26/2018 CLINICAL DATA:  Follow-up metastatic testicular seminoma. Status post chemotherapy. EXAM: CT CHEST, ABDOMEN, AND PELVIS WITH CONTRAST TECHNIQUE: Multidetector CT imaging of the chest, abdomen and pelvis was performed following the standard protocol during bolus administration of intravenous contrast. CONTRAST:  174mL OMNIPAQUE IOHEXOL 300 MG/ML  SOLN COMPARISON:  04/09/2018 FINDINGS: CT CHEST FINDINGS Cardiovascular: No acute findings. Aortic and coronary artery atherosclerosis. Mediastinum/Lymph Nodes: No masses or pathologically enlarged lymph nodes identified. Lungs/Pleura: No pulmonary infiltrate or mass identified. No effusion present. Musculoskeletal:  No suspicious bone lesions identified. CT ABDOMEN AND PELVIS FINDINGS Hepatobiliary: No masses identified. Gallbladder is unremarkable. No evidence of biliary ductal dilatation. Pancreas:  No mass or inflammatory changes. Spleen: Within normal limits in size. Stable small cystic lesion in the superior aspect of spleen, consistent with benign etiology. Adrenals/Urinary tract: Stable small bilateral renal cysts. No masses or hydronephrosis. Stomach/Bowel: No evidence of obstruction, inflammatory process, or abnormal fluid collections. Normal appendix visualized. Vascular/Lymphatic: Stable mild left paraaortic lymphadenopathy with lymph node measuring 2.0 cm in short axis on image 78/2. No other sites of lymphadenopathy identified. Aortic atherosclerosis. Reproductive: Stable mildly enlarged prostate. Other:  None. Musculoskeletal:  No suspicious bone lesions identified. IMPRESSION: 1. Stable mild left abdominal retroperitoneal lymphadenopathy. 2. No new or progressive  metastatic disease within the chest, abdomen, or pelvis. 3. Stable mildly enlarged prostate. Aortic Atherosclerosis (ICD10-I70.0). Coronary artery atherosclerosis. Electronically Signed   By: Marlaine Hind M.D.   On: 11/26/2018 15:04    ASSESSMENT & PLAN:   58 y.o. male with  1. 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 Prostate enlargement.  11/17/17 PET/CT revealed Isolated hypermetabolic retroperitoneal lymphadenopathy. Findings suspicious for lymphoma as no primary lesion is identified to suggest this is metastatic adenopathy. Recommend retroperitoneal biopsy. The largest left-sided node appears necrotic and is actually  decreased in size since the prior CT scan. It appeared inflamed on the prior study and may have infarcted. I would not recommend biopsying this lesion. No adenopathy in the neck, axilla, chest, pelvis or inguinal regions. No worrisome pulmonary lesions or osseous lesions.  11/25/17 pathology results revealed  finding of metastatic seminoma    12/04/17 US Scrotum w/doppler revealed Negative scrotal ultrasound with no distinct testicular mass Identified. 2. 5 mm simple left epididymal cyst, almost certainly benign and felt to be incidental in nature.   S/p 4 cycles of EP treatment, tolerated well overall  04/09/18 CT C/A/P revealed "no metastatic adenopathy in the chest. Abdomen/Pelvis: 1. Interval reduction in volume of LEFT periaortic metastatic lymph node. 2. No new adenopathy in the abdomen pelvis. 3. Benign-appearing splenic in LEFT renal cystic lesions."  11/26/2018 CT CAP revealed "1. Stable mild left abdominal retroperitoneal lymphadenopathy. 2. No new or progressive metastatic disease within the chest, abdomen, or pelvis. 3. Stable mildly  enlarged prostate."  2. Chemotherapy related neutropenia -- resolved.  PLAN:  -Discussed most recent pt labwork from 11/26/2018; blood counts and blood chemistries are normal, LDH normalized, AFP and beta HCG tumor markers are normal -Discussed 11/26/2018 CT CAP which revealed no new or progressive disease -The pt shows no clinical or lab progression/return of his extranodal seminoma at this time.  -No indication for further treatment at this time. -Recommended that the pt continue to eat well, drink at least 48-64 oz of water each day, and walk 20-30 minutes each day. -Will see the pt back in 4 months with labs one week prior   RTC with Dr Irene Limbo with labs in 4 months. Plz schedule labs 1 week prior to clinic visit   All of the patients questions were answered with apparent satisfaction. The patient knows to call the clinic with any problems, questions or concerns.  The total time spent in the appt was 20 minutes and more than 50% was on counseling and direct patient cares.   Sullivan Lone MD MS AAHIVMS [facility] Hematology/Oncology Physician [facility]  (Office):       [phone] (Work cell):  [phone] (Fax):           [phone]  12/08/2018 8:27 PM  I, De Burrs, am acting as a scribe for Dr. Irene Limbo  .I have reviewed the above documentation for accuracy and completeness, and I agree with the above. Brunetta Genera MD

## 2018-12-10 ENCOUNTER — Telehealth: Payer: Self-pay | Admitting: Hematology

## 2018-12-10 ENCOUNTER — Inpatient Hospital Stay (HOSPITAL_BASED_OUTPATIENT_CLINIC_OR_DEPARTMENT_OTHER): Payer: BC Managed Care – PPO | Admitting: Hematology

## 2018-12-10 ENCOUNTER — Other Ambulatory Visit: Payer: Self-pay

## 2018-12-10 VITALS — BP 151/78 | HR 96 | Temp 98.0°F | Resp 18 | Ht 72.0 in | Wt 302.3 lb

## 2018-12-10 DIAGNOSIS — Z7982 Long term (current) use of aspirin: Secondary | ICD-10-CM | POA: Diagnosis not present

## 2018-12-10 DIAGNOSIS — C6290 Malignant neoplasm of unspecified testis, unspecified whether descended or undescended: Secondary | ICD-10-CM

## 2018-12-10 DIAGNOSIS — Z79899 Other long term (current) drug therapy: Secondary | ICD-10-CM | POA: Diagnosis not present

## 2018-12-10 DIAGNOSIS — Z8547 Personal history of malignant neoplasm of testis: Secondary | ICD-10-CM | POA: Diagnosis not present

## 2018-12-10 DIAGNOSIS — I10 Essential (primary) hypertension: Secondary | ICD-10-CM | POA: Diagnosis not present

## 2018-12-10 DIAGNOSIS — E119 Type 2 diabetes mellitus without complications: Secondary | ICD-10-CM | POA: Diagnosis not present

## 2018-12-10 NOTE — Telephone Encounter (Signed)
Scheduled appt per 8/27 los.  Spoke with patient and he is aware of his appt date and time.

## 2019-02-18 DIAGNOSIS — C6290 Malignant neoplasm of unspecified testis, unspecified whether descended or undescended: Secondary | ICD-10-CM | POA: Diagnosis not present

## 2019-02-18 DIAGNOSIS — E782 Mixed hyperlipidemia: Secondary | ICD-10-CM | POA: Diagnosis not present

## 2019-02-18 DIAGNOSIS — I10 Essential (primary) hypertension: Secondary | ICD-10-CM | POA: Diagnosis not present

## 2019-02-18 DIAGNOSIS — E1169 Type 2 diabetes mellitus with other specified complication: Secondary | ICD-10-CM | POA: Diagnosis not present

## 2019-03-25 DIAGNOSIS — U071 COVID-19: Secondary | ICD-10-CM | POA: Diagnosis not present

## 2019-03-29 ENCOUNTER — Telehealth: Payer: Self-pay | Admitting: Hematology

## 2019-03-29 NOTE — Telephone Encounter (Signed)
[place] [facility] 12/28 moved lab/fu to 1/20. Confirmed with patient.

## 2019-04-01 DIAGNOSIS — H43811 Vitreous degeneration, right eye: Secondary | ICD-10-CM | POA: Diagnosis not present

## 2019-04-05 ENCOUNTER — Other Ambulatory Visit: Payer: BC Managed Care – PPO

## 2019-04-12 ENCOUNTER — Ambulatory Visit: Payer: BC Managed Care – PPO | Admitting: Hematology

## 2019-04-13 DIAGNOSIS — H40013 Open angle with borderline findings, low risk, bilateral: Secondary | ICD-10-CM | POA: Diagnosis not present

## 2019-05-05 ENCOUNTER — Other Ambulatory Visit: Payer: Self-pay

## 2019-05-05 ENCOUNTER — Inpatient Hospital Stay (HOSPITAL_BASED_OUTPATIENT_CLINIC_OR_DEPARTMENT_OTHER): Payer: BC Managed Care – PPO | Admitting: Hematology

## 2019-05-05 ENCOUNTER — Inpatient Hospital Stay: Payer: BC Managed Care – PPO | Attending: Hematology

## 2019-05-05 VITALS — BP 134/81 | HR 88 | Temp 98.4°F | Resp 18 | Ht 72.0 in | Wt 300.5 lb

## 2019-05-05 DIAGNOSIS — Z8547 Personal history of malignant neoplasm of testis: Secondary | ICD-10-CM | POA: Insufficient documentation

## 2019-05-05 DIAGNOSIS — C6290 Malignant neoplasm of unspecified testis, unspecified whether descended or undescended: Secondary | ICD-10-CM

## 2019-05-05 LAB — CBC WITH DIFFERENTIAL/PLATELET
Abs Immature Granulocytes: 0.02 10*3/uL (ref 0.00–0.07)
Basophils Absolute: 0 10*3/uL (ref 0.0–0.1)
Basophils Relative: 0 %
Eosinophils Absolute: 0.2 10*3/uL (ref 0.0–0.5)
Eosinophils Relative: 2 %
HCT: 45.3 % (ref 39.0–52.0)
Hemoglobin: 14.9 g/dL (ref 13.0–17.0)
Immature Granulocytes: 0 %
Lymphocytes Relative: 30 %
Lymphs Abs: 2.8 10*3/uL (ref 0.7–4.0)
MCH: 28.3 pg (ref 26.0–34.0)
MCHC: 32.9 g/dL (ref 30.0–36.0)
MCV: 86 fL (ref 80.0–100.0)
Monocytes Absolute: 0.7 10*3/uL (ref 0.1–1.0)
Monocytes Relative: 8 %
Neutro Abs: 5.7 10*3/uL (ref 1.7–7.7)
Neutrophils Relative %: 60 %
Platelets: 253 10*3/uL (ref 150–400)
RBC: 5.27 MIL/uL (ref 4.22–5.81)
RDW: 13.6 % (ref 11.5–15.5)
WBC: 9.4 10*3/uL (ref 4.0–10.5)
nRBC: 0 % (ref 0.0–0.2)

## 2019-05-05 LAB — CMP (CANCER CENTER ONLY)
ALT: 18 U/L (ref 0–44)
AST: 14 U/L — ABNORMAL LOW (ref 15–41)
Albumin: 4.3 g/dL (ref 3.5–5.0)
Alkaline Phosphatase: 113 U/L (ref 38–126)
Anion gap: 13 (ref 5–15)
BUN: 16 mg/dL (ref 6–20)
CO2: 26 mmol/L (ref 22–32)
Calcium: 8.9 mg/dL (ref 8.9–10.3)
Chloride: 100 mmol/L (ref 98–111)
Creatinine: 0.83 mg/dL (ref 0.61–1.24)
GFR, Est AFR Am: >60 mL/min (ref >60)
GFR, Estimated: >60 mL/min (ref >60)
Glucose, Bld: 127 mg/dL — ABNORMAL HIGH (ref 70–99)
Potassium: 4.2 mmol/L (ref 3.5–5.1)
Sodium: 139 mmol/L (ref 135–145)
Total Bilirubin: 0.3 mg/dL (ref 0.3–1.2)
Total Protein: 7.4 g/dL (ref 6.5–8.1)

## 2019-05-05 LAB — LACTATE DEHYDROGENASE: LDH: 176 U/L (ref 98–192)

## 2019-05-05 NOTE — Progress Notes (Signed)
HEMATOLOGY/ONCOLOGY CLNIC NOTE  Date of Service: 05/05/2019  Patient Care Team: Neil Contras, MD as PCP - General (Family Medicine)  CHIEF COMPLAINTS/PURPOSE OF CONSULTATION:  F/u for extragondal seminoma  HISTORY OF PRESENTING ILLNESS:   Neil Berg is a wonderful 59 y.o. male who has been referred to [place] by Dr. Antony Berg for evaluation and management of Lymphadenopathy. He is accompanied today by his wife. The pt reports that he is doing well overall and [place]   The pt reports that he hasn't had any pain in over a week and recently appeared to [facility] on 10/17/17. He notes that he woke up on the morning of 10/17/17 with significant, unique, back and kidney pain which began to spread around to the RUQ of his abdomen as the morning progressed. He notes that his pain went away after taking pain medications for 5 days, and describes that the pain was waxing and waning. He also endorses some positional exacerbation to his pain. He denies any nausea, vomiting, diarrhea, changes in bowel habits, testicular pain or swelling, difficulty/changes in urination at the time or since then.    The pt also notes that  In the week of these symptoms he felt very cold, which is different for him as he describes himself as very warm natured. He also notes that his appetite weakened and he lost about 10 pounds, and was abiding by the [employer]. He notes that his appetite has returned and he is eating normally again. He denies any pain associated with meals. He denies recent exotic or international travel  He takes Jardiance for his DM. He notes that his PSA spiked two years ago, had a biopsy, and his PSA counts have decreased recently and is believed to be BPH.    He also notes that he had a recurring rash on his calves, worse on the left, that was red and flat, mimicking his sock impression, but extending further up his leg. He notes that this happened over  3 months, and that it came and went without any discernable pattern. He denies any associated itching.   He also notes a place on the front of his left lower leg that has been slightly swollen for the past a week. He denies remembering running into anything, bruising, or pain.   Of note prior to the patient's visit today, pt has had CT A/P completed on 10/14/17 with results revealing Retroperitoneal lymphadenopathy, highly suspicious for lymphoma or metastatic disease. 2. Prostate enlargement 3.  Aortic Atherosclerosis.   Most recent lab results (10/17/17) of CBC w/diff is as follows: all values are WNL except for WBC at 13.9k, RBC at 5.83, ANC at 10.0k, Monocytes abs at 1.4k.  On review of systems, pt reports recent back pain, feeling cold recently, recent abdominal pain, and denies nausea, vomiting, diarrhea, changes in bowel habits, difficulty/changes in urination, blood in the urine, abdominal trauma, constipation, fevers, night sweats, chills, testicular pain or swelling, noticing any lumps or bumps, unexpected weight loss, and any other symptoms.   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  On Social Hx the pt reports working in [employer] and denies chemical/radiaiton exposure. He denies ever smoking and other drug use.  Interval History:   Neil Berg returns today for management and evaluation after completing 4 cycles of  EP treatment of his Stage II extra-gonadal retroperitoneal Seminoma. The patient's last visit with [place] was on 12/10/2018. The pt reports that he is doing well overall.  The pt reports that he had [place] in the beginning of December. He is unsure of how he contracted the virus. Pt had a low-grade fever, fatigue and lost his sense of taste. His symptoms have since resolved and he denies any residual effects.   He does have some intermittent  pain in his back but nothing that's too bothersome. He has otherwise felt great.    Lab results today (05/05/19) of CBC w/diff and CMP is as follows: all values are WNL except for Glucose at 127, AST at 14. 05/05/2019 LDH at 176  05/05/2019 Beta hCG quant I<1 05/05/2019 AFP tumor marker is 2   On review of systems, pt reports low back pain and denies SOB, changes in breathing, unexpected weight loss, constipation, leg swelling, testicular pain/swelling, urinary changes and any other symptoms.    MEDICAL HISTORY:  Past Medical History:  Diagnosis Date  . Chest pain   . Diabetes mellitus without complication ([place])   . Hypertension   . Obesity   Type II Diabetes Mellitus   SURGICAL HISTORY: Past Surgical History:  Procedure Laterality Date  . EYE SURGERY     left eye cataract removal / implant   . IR IMAGING GUIDED PORT INSERTION  12/11/2017  . IR REMOVAL TUN ACCESS W/ PORT W/O FL MOD SED  05/01/2018  . TONSILLECTOMY      Tonsillectomy 1974 Cataract surgery-left eye 04/2015 Colonoscopy 06/04/16  SOCIAL HISTORY: Social History   Socioeconomic History  . Marital status: Married    Spouse name: Not on file  . Number of children: Not on file  . Years of education: Not on file  . Highest education level: Not on file  Occupational History  . Not on file  Tobacco Use  . Smoking status: Never Smoker  . Smokeless tobacco: Never Used  Substance and Sexual Activity  . Alcohol use: No  . Drug use: No  . Sexual activity: Not on file  Other Topics Concern  . Not on file  Social History Narrative  . Not on file   Social Determinants of Health   Financial Resource Strain:   . Difficulty of Paying Living Expenses: Not on file  Food Insecurity:   . Worried About Charity fundraiser in the Last Year: Not on file  . Ran Out of Food in the Last Year: Not on file  Transportation Needs:   . Lack of Transportation (Medical): Not on file  . Lack of Transportation (Non-Medical): Not  on file  Physical Activity:   . Days of Exercise per Week: Not on file  . Minutes of Exercise per Session: Not on file  Stress:   . Feeling of Stress : Not on file  Social Connections:   . Frequency of Communication with Friends and Family: Not on file  . Frequency of Social Gatherings with Friends and Family: Not on file  . Attends Religious Services: Not on file  . Active Member of Clubs or Organizations: Not on file  . Attends Archivist Meetings: Not on file  . Marital Status: Not on file  Intimate Partner Violence:   . Fear of Current or Ex-Partner: Not on file  . Emotionally Abused: Not on file  . Physically Abused: Not on file  . Sexually Abused: Not on file    FAMILY HISTORY: Family  History  Problem Relation Age of Onset  . Heart disease Maternal Grandmother   . Heart attack Maternal Grandfather     ALLERGIES:  has No Known Allergies.  MEDICATIONS:  Current Outpatient Medications  Medication Sig Dispense Refill  . amLODipine (NORVASC) 5 MG tablet Take 1 tablet (5 mg total) by mouth daily. 30 tablet 11  . aspirin EC 81 MG tablet Take 81 mg by mouth daily.    Marland Kitchen atorvastatin (LIPITOR) 40 MG tablet Take 40 mg by mouth daily.    Marland Kitchen b complex vitamins tablet Take 1 tablet by mouth daily.    Marland Kitchen JARDIANCE 25 MG TABS tablet TK 1 T PO QAM  0  . lisinopril (PRINIVIL,ZESTRIL) 20 MG tablet TAKE 1 TABLET BY MOUTH EVERY DAY. DISCONTINUE LISINOPRIL/HCTZ COMBINATION 30 tablet 0   No current facility-administered medications for this visit.    REVIEW OF SYSTEMS:   A 10+ POINT REVIEW OF SYSTEMS WAS OBTAINED including neurology, dermatology, psychiatry, cardiac, respiratory, lymph, extremities, GI, GU, Musculoskeletal, constitutional, breasts, reproductive, HEENT.  All pertinent positives are noted in the HPI.  All others are negative.   PHYSICAL EXAMINATION: ECOG PERFORMANCE STATUS: 1 - Symptomatic but completely ambulatory   GENERAL:alert, in no acute distress and  comfortable SKIN: no acute rashes, no significant lesions EYES: conjunctiva are pink and non-injected, sclera anicteric OROPHARYNX: MMM, no exudates, no oropharyngeal erythema or ulceration NECK: supple, no JVD LYMPH:  no palpable lymphadenopathy in the cervical, axillary or inguinal regions LUNGS: clear to auscultation b/l with normal respiratory effort HEART: regular rate & rhythm ABDOMEN:  normoactive bowel sounds , non tender, not distended. No palpable hepatosplenomegaly.  Extremity: no pedal edema PSYCH: alert & oriented x 3 with fluent speech NEURO: no focal motor/sensory deficits  LABORATORY DATA:  I have reviewed the data as listed  . CBC Latest Ref Rng & Units 05/05/2019 11/26/2018 08/20/2018  WBC 4.0 - 10.5 K/uL 9.4 11.1(H) 10.1  Hemoglobin 13.0 - 17.0 g/dL 14.9 15.5 15.0  Hematocrit 39.0 - 52.0 % 45.3 47.3 46.8  Platelets 150 - 400 K/uL 253 264 267  ANC 300  . CMP Latest Ref Rng & Units 05/05/2019 11/26/2018 08/20/2018  Glucose 70 - 99 mg/dL 127(H) 158(H) 128(H)  BUN 6 - 20 mg/dL 16 20 18   Creatinine 0.61 - 1.24 mg/dL 0.83 1.00 0.84  Sodium 135 - 145 mmol/L 139 136 136  Potassium 3.5 - 5.1 mmol/L 4.2 4.2 4.3  Chloride 98 - 111 mmol/L 100 100 97(L)  CO2 22 - 32 mmol/L 26 24 27   Calcium 8.9 - 10.3 mg/dL 8.9 9.9 9.3  Total Protein 6.5 - 8.1 g/dL 7.4 7.8 7.3  Total Bilirubin 0.3 - 1.2 mg/dL 0.3 0.4 0.4  Alkaline Phos 38 - 126 U/L 113 121 136(H)  AST 15 - 41 U/L 14(L) 16 19  ALT 0 - 44 U/L 18 21 29     Pathology:   11/25/17 Tissue Flow Cytometry:    Component     Latest Ref Rng & Units 05/05/2019  hCG Quant     0 - 3 mIU/mL <1  AFP, Serum, Tumor Marker     0.0 - 8.3 ng/mL 2.0  LDH     98 - 192 U/L 176     RADIOGRAPHIC STUDIES: I have personally reviewed the radiological images as listed and agreed with the findings in the report. No results found.  ASSESSMENT & PLAN:   59 y.o. male with  1. 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 Prostate enlargement.  11/17/17 PET/CT revealed Isolated hypermetabolic retroperitoneal lymphadenopathy. Findings suspicious for lymphoma as no primary lesion is identified to suggest this is metastatic adenopathy. Recommend retroperitoneal biopsy. The largest left-sided node appears necrotic and is actually decreased in size since the prior CT scan. It appeared inflamed on the prior study and may have infarcted. I would not recommend biopsying this lesion. No adenopathy in the neck, axilla, chest, pelvis or inguinal regions. No worrisome pulmonary lesions or osseous lesions.  11/25/17 pathology results revealed  finding of metastatic seminoma    12/04/17 US Scrotum w/doppler revealed Negative scrotal ultrasound with no distinct testicular mass Identified. 2. 5 mm simple left epididymal cyst, almost certainly benign and felt to be incidental in nature.   S/p 4 cycles of EP treatment, tolerated well overall  04/09/18 CT C/A/P revealed "no metastatic adenopathy in the chest. Abdomen/Pelvis: 1. Interval reduction in volume of LEFT periaortic metastatic lymph node. 2. No new adenopathy in the abdomen pelvis. 3. Benign-appearing splenic in LEFT renal cystic lesions."  11/26/2018 CT CAP revealed "1. Stable mild left abdominal retroperitoneal lymphadenopathy. 2. No new or progressive metastatic disease within the chest, abdomen, or pelvis. 3. Stable mildly enlarged prostate."  2. Chemotherapy related neutropenia -- resolved.  PLAN:  -Discussed pt labwork today, 05/05/19; blood counts and chemistries are stable -Discussed 05/05/2019 LDH at 176  -Discussed 05/05/2019 Beta hCG quant <1 -Discussed 05/05/2019 AFP tumor marker is 2 (WNL) -The pt shows no clinical or lab progression/return of  his extranodal seminoma at this time.  -Will continue to monitor with clinic visits every 4 months and scans every 8 months -No indication for further treatment at this time.  -Recommend pt receive COVID19 vaccine when available -Will get rpt CT C/A/P in 14 weeks with labs  -Will see the pt back in 4 months    FOLLOW UP: RTC with Dr. Irene Limbo in 16 weeks CT C/A/P  in 14 weeks with labs    The total time spent in the appt was 20 minutes and more than 50% was on counseling and direct patient cares.  All of the patient's questions were answered with apparent satisfaction. The patient knows to call the clinic with any problems, questions or concerns.    Sullivan Lone MD [place] AAHIVMS [facility] Hematology/Oncology Physician [facility]  (Office):       [phone] (Work cell):  [phone] (Fax):           [phone]  05/05/2019 4:51 PM  I, Yevette Edwards, am acting as a scribe for Dr. Sullivan Lone.   .I have reviewed the above documentation for accuracy and completeness, and I agree with the above. Brunetta Genera MD

## 2019-05-06 ENCOUNTER — Telehealth: Payer: Self-pay | Admitting: Hematology

## 2019-05-06 LAB — BETA HCG QUANT (REF LAB): hCG Quant: <1 m[IU]/mL (ref 0–3)

## 2019-05-06 LAB — AFP TUMOR MARKER: AFP, Serum, Tumor Marker: 2 ng/mL (ref 0.0–8.3)

## 2019-05-06 NOTE — Telephone Encounter (Signed)
Scheduled appt per 1/20 los.  Sent a message to HIM pool to get a calendar mailed out. 

## 2019-05-12 ENCOUNTER — Telehealth: Payer: Self-pay | Admitting: *Deleted

## 2019-05-12 NOTE — Telephone Encounter (Addendum)
Contacted patient with message from MD below. Patient verbalized understanding.   ----- Message from Brunetta Genera, MD sent at 05/11/2019 11:17 PM EST ----- LDH, HCG and AFP -- all tumor markers WNL. No lab evidence of seminoma recurrence based on this. F/u as per discussed plan .

## 2019-07-02 ENCOUNTER — Ambulatory Visit: Payer: BC Managed Care – PPO | Attending: Internal Medicine

## 2019-07-02 DIAGNOSIS — Z23 Encounter for immunization: Secondary | ICD-10-CM

## 2019-07-02 NOTE — Progress Notes (Signed)
   Covid-19 Vaccination Clinic  Name:  Toluwanimi Mccaleb    MRN: [id number] DOB: 26-May-1960  07/02/2019  Mr. Roets was observed post Covid-19 immunization for 15 minutes without incident. He was provided with Vaccine Information Sheet and instruction to access the V-Safe system.   Mr. Sharman was instructed to call 911 with any severe reactions post vaccine: Marland Kitchen Difficulty breathing  . Swelling of face and throat  . A fast heartbeat  . A bad rash all over body  . Dizziness and weakness   Immunizations Administered    Name Date Dose VIS Date Route   Pfizer COVID-19 Vaccine 07/02/2019  3:54 PM 0.3 mL 03/26/2019 Intramuscular   Manufacturer: [place]   Lot: [id number]   [place]: [id number]

## 2019-08-02 ENCOUNTER — Ambulatory Visit: Payer: BC Managed Care – PPO | Attending: Internal Medicine

## 2019-08-02 DIAGNOSIS — Z23 Encounter for immunization: Secondary | ICD-10-CM

## 2019-08-02 NOTE — Progress Notes (Signed)
   Covid-19 Vaccination Clinic  Name:  Breon Enz    MRN: [id number] DOB: 15-Dec-1960  08/02/2019  Mr. Cartelli was observed post Covid-19 immunization for 15 minutes without incident. He was provided with Vaccine Information Sheet and instruction to access the V-Safe system.   Mr. Ciccarello was instructed to call 911 with any severe reactions post vaccine: Marland Kitchen Difficulty breathing  . Swelling of face and throat  . A fast heartbeat  . A bad rash all over body  . Dizziness and weakness   Immunizations Administered    Name Date Dose VIS Date Route   Pfizer COVID-19 Vaccine 08/02/2019  3:24 PM 0.3 mL 06/09/2018 Intramuscular   Manufacturer: [place]   Lot: [id number]   [place]: [id number]

## 2019-08-10 ENCOUNTER — Other Ambulatory Visit: Payer: Self-pay | Admitting: *Deleted

## 2019-08-10 DIAGNOSIS — C6290 Malignant neoplasm of unspecified testis, unspecified whether descended or undescended: Secondary | ICD-10-CM

## 2019-08-11 ENCOUNTER — Inpatient Hospital Stay: Payer: BC Managed Care – PPO | Attending: Hematology

## 2019-08-11 ENCOUNTER — Other Ambulatory Visit: Payer: Self-pay

## 2019-08-11 DIAGNOSIS — Z8547 Personal history of malignant neoplasm of testis: Secondary | ICD-10-CM | POA: Diagnosis not present

## 2019-08-11 DIAGNOSIS — C6290 Malignant neoplasm of unspecified testis, unspecified whether descended or undescended: Secondary | ICD-10-CM

## 2019-08-11 LAB — CMP (CANCER CENTER ONLY)
ALT: 26 U/L (ref 0–44)
AST: 17 U/L (ref 15–41)
Albumin: 4.2 g/dL (ref 3.5–5.0)
Alkaline Phosphatase: 91 U/L (ref 38–126)
Anion gap: 9 (ref 5–15)
BUN: 11 mg/dL (ref 6–20)
CO2: 28 mmol/L (ref 22–32)
Calcium: 9.5 mg/dL (ref 8.9–10.3)
Chloride: 101 mmol/L (ref 98–111)
Creatinine: 0.8 mg/dL (ref 0.61–1.24)
GFR, Est AFR Am: >60 mL/min (ref >60)
GFR, Estimated: >60 mL/min (ref >60)
Glucose, Bld: 151 mg/dL — ABNORMAL HIGH (ref 70–99)
Potassium: 4.6 mmol/L (ref 3.5–5.1)
Sodium: 138 mmol/L (ref 135–145)
Total Bilirubin: 0.8 mg/dL (ref 0.3–1.2)
Total Protein: 7.3 g/dL (ref 6.5–8.1)

## 2019-08-11 LAB — CBC WITH DIFFERENTIAL (CANCER CENTER ONLY)
Abs Immature Granulocytes: 0.03 10*3/uL (ref 0.00–0.07)
Basophils Absolute: 0 10*3/uL (ref 0.0–0.1)
Basophils Relative: 0 %
Eosinophils Absolute: 0.2 10*3/uL (ref 0.0–0.5)
Eosinophils Relative: 2 %
HCT: 47.3 % (ref 39.0–52.0)
Hemoglobin: 15.7 g/dL (ref 13.0–17.0)
Immature Granulocytes: 0 %
Lymphocytes Relative: 24 %
Lymphs Abs: 1.8 10*3/uL (ref 0.7–4.0)
MCH: 28.7 pg (ref 26.0–34.0)
MCHC: 33.2 g/dL (ref 30.0–36.0)
MCV: 86.5 fL (ref 80.0–100.0)
Monocytes Absolute: 0.7 10*3/uL (ref 0.1–1.0)
Monocytes Relative: 9 %
Neutro Abs: 4.8 10*3/uL (ref 1.7–7.7)
Neutrophils Relative %: 65 %
Platelet Count: 237 10*3/uL (ref 150–400)
RBC: 5.47 MIL/uL (ref 4.22–5.81)
RDW: 13.3 % (ref 11.5–15.5)
WBC Count: 7.4 10*3/uL (ref 4.0–10.5)
nRBC: 0 % (ref 0.0–0.2)

## 2019-08-11 LAB — LACTATE DEHYDROGENASE: LDH: 163 U/L (ref 98–192)

## 2019-08-12 LAB — BETA HCG QUANT (REF LAB): hCG Quant: <1 m[IU]/mL (ref 0–3)

## 2019-08-12 LAB — AFP TUMOR MARKER: AFP, Serum, Tumor Marker: 2 ng/mL (ref 0.0–8.3)

## 2019-08-18 DIAGNOSIS — M25561 Pain in right knee: Secondary | ICD-10-CM | POA: Diagnosis not present

## 2019-08-25 ENCOUNTER — Inpatient Hospital Stay: Payer: BC Managed Care – PPO | Attending: Hematology | Admitting: Hematology

## 2019-08-25 ENCOUNTER — Other Ambulatory Visit: Payer: Self-pay

## 2019-08-25 VITALS — BP 156/76 | HR 89 | Temp 97.8°F | Resp 22 | Wt 274.3 lb

## 2019-08-25 DIAGNOSIS — R591 Generalized enlarged lymph nodes: Secondary | ICD-10-CM | POA: Diagnosis not present

## 2019-08-25 DIAGNOSIS — N400 Enlarged prostate without lower urinary tract symptoms: Secondary | ICD-10-CM | POA: Insufficient documentation

## 2019-08-25 DIAGNOSIS — C6290 Malignant neoplasm of unspecified testis, unspecified whether descended or undescended: Secondary | ICD-10-CM | POA: Diagnosis not present

## 2019-08-25 DIAGNOSIS — I10 Essential (primary) hypertension: Secondary | ICD-10-CM | POA: Diagnosis not present

## 2019-08-25 DIAGNOSIS — E119 Type 2 diabetes mellitus without complications: Secondary | ICD-10-CM | POA: Diagnosis not present

## 2019-08-25 DIAGNOSIS — Z7982 Long term (current) use of aspirin: Secondary | ICD-10-CM | POA: Insufficient documentation

## 2019-08-25 DIAGNOSIS — Z79899 Other long term (current) drug therapy: Secondary | ICD-10-CM | POA: Insufficient documentation

## 2019-08-25 DIAGNOSIS — Z8547 Personal history of malignant neoplasm of testis: Secondary | ICD-10-CM | POA: Diagnosis not present

## 2019-08-25 DIAGNOSIS — E669 Obesity, unspecified: Secondary | ICD-10-CM | POA: Diagnosis not present

## 2019-08-25 DIAGNOSIS — Z8249 Family history of ischemic heart disease and other diseases of the circulatory system: Secondary | ICD-10-CM | POA: Insufficient documentation

## 2019-08-25 NOTE — Progress Notes (Signed)
HEMATOLOGY/ONCOLOGY CLNIC NOTE  Date of Service: 08/25/2019  Patient Care Team: Antony Contras, MD as PCP - General (Family Medicine)  CHIEF COMPLAINTS/PURPOSE OF CONSULTATION:  F/u for extragondal seminoma  HISTORY OF PRESENTING ILLNESS:   Neil Berg is a wonderful 59 y.o. male who has been referred to [place] by Dr. Antony Contras for evaluation and management of Lymphadenopathy. He is accompanied today by his wife. The pt reports that he is doing well overall and [place]   The pt reports that he hasn't had any pain in over a week and recently appeared to [facility] on 10/17/17. He notes that he woke up on the morning of 10/17/17 with significant, unique, back and kidney pain which began to spread around to the RUQ of his abdomen as the morning progressed. He notes that his pain went away after taking pain medications for 5 days, and describes that the pain was waxing and waning. He also endorses some positional exacerbation to his pain. He denies any nausea, vomiting, diarrhea, changes in bowel habits, testicular pain or swelling, difficulty/changes in urination at the time or since then.    The pt also notes that  In the week of these symptoms he felt very cold, which is different for him as he describes himself as very warm natured. He also notes that his appetite weakened and he lost about 10 pounds, and was abiding by the [employer]. He notes that his appetite has returned and he is eating normally again. He denies any pain associated with meals. He denies recent exotic or international travel  He takes Jardiance for his DM. He notes that his PSA spiked two years ago, had a biopsy, and his PSA counts have decreased recently and is believed to be BPH.    He also notes that he had a recurring rash on his calves, worse on the left, that was red and flat, mimicking his sock impression, but extending further up his leg. He notes that this happened over  3 months, and that it came and went without any discernable pattern. He denies any associated itching.   He also notes a place on the front of his left lower leg that has been slightly swollen for the past a week. He denies remembering running into anything, bruising, or pain.   Of note prior to the patient's visit today, pt has had CT A/P completed on 10/14/17 with results revealing Retroperitoneal lymphadenopathy, highly suspicious for lymphoma or metastatic disease. 2. Prostate enlargement 3.  Aortic Atherosclerosis.   Most recent lab results (10/17/17) of CBC w/diff is as follows: all values are WNL except for WBC at 13.9k, RBC at 5.83, ANC at 10.0k, Monocytes abs at 1.4k.  On review of systems, pt reports recent back pain, feeling cold recently, recent abdominal pain, and denies nausea, vomiting, diarrhea, changes in bowel habits, difficulty/changes in urination, blood in the urine, abdominal trauma, constipation, fevers, night sweats, chills, testicular pain or swelling, noticing any lumps or bumps, unexpected weight loss, and any other symptoms.   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  On Social Hx the pt reports working in [employer] and denies chemical/radiaiton exposure. He denies ever smoking and other drug use.  Interval History:   Neil Berg returns today for management and evaluation after completing 4 cycles of  EP treatment of his Stage II extra-gonadal retroperitoneal Seminoma. The patient's last visit with [place] was on 05/05/2019. The pt reports that he is doing well overall.  The pt reports that he has had some pain in the area under both of his armpits that started about two months ago. This began prior to his COVID19 vaccination. He has continued to have back pain that is worsened when he sits down.   Pt has been eating healthier, intermittent  fasting, and walking more. He has lost over 20 lbs with his efforts.   Lab results (08/11/19) of CBC w/diff and CMP is as follows: all values are WNL except for Glucose at 151. 08/11/2019 LDH at 163 08/11/2019 AFP at 2.0 08/11/2019 hCG Quant at <1  On review of systems, pt reports discomfort under the armpit b/l, chronic back pain and denies headaches, SOB, chest pain, abdominal pain, dysuria and any other symptoms.    MEDICAL HISTORY:  Past Medical History:  Diagnosis Date  . Chest pain   . Diabetes mellitus without complication ([place])   . Hypertension   . Obesity   Type II Diabetes Mellitus   SURGICAL HISTORY: Past Surgical History:  Procedure Laterality Date  . EYE SURGERY     left eye cataract removal / implant   . IR IMAGING GUIDED PORT INSERTION  12/11/2017  . IR REMOVAL TUN ACCESS W/ PORT W/O FL MOD SED  05/01/2018  . TONSILLECTOMY      Tonsillectomy 1974 Cataract surgery-left eye 04/2015 Colonoscopy 06/04/16  SOCIAL HISTORY: Social History   Socioeconomic History  . Marital status: Married    Spouse name: Not on file  . Number of children: Not on file  . Years of education: Not on file  . Highest education level: Not on file  Occupational History  . Not on file  Tobacco Use  . Smoking status: Never Smoker  . Smokeless tobacco: Never Used  Substance and Sexual Activity  . Alcohol use: No  . Drug use: No  . Sexual activity: Not on file  Other Topics Concern  . Not on file  Social History Narrative  . Not on file   Social Determinants of Health   Financial Resource Strain:   . Difficulty of Paying Living Expenses:   Food Insecurity:   . Worried About Charity fundraiser in the Last Year:   . Arboriculturist in the Last Year:   Transportation Needs:   . Film/video editor (Medical):   Marland Kitchen Lack of Transportation (Non-Medical):   Physical Activity:   . Days of Exercise per Week:   . Minutes of Exercise per Session:   Stress:   . Feeling of Stress :    Social Connections:   . Frequency of Communication with Friends and Family:   . Frequency of Social Gatherings with Friends and Family:   . Attends Religious Services:   . Active Member of Clubs or Organizations:   . Attends Archivist Meetings:   Marland Kitchen Marital Status:   Intimate Partner Violence:   . Fear of Current or Ex-Partner:   . Emotionally Abused:   Marland Kitchen Physically Abused:   . Sexually Abused:     FAMILY HISTORY: Family History  Problem Relation Age of Onset  . Heart disease Maternal Grandmother   . Heart attack Maternal Grandfather     ALLERGIES:  has No Known Allergies.  MEDICATIONS:  Current Outpatient Medications  Medication Sig Dispense Refill  . amLODipine (NORVASC) 5 MG  tablet Take 1 tablet (5 mg total) by mouth daily. 30 tablet 11  . aspirin EC 81 MG tablet Take 81 mg by mouth daily.    Marland Kitchen atorvastatin (LIPITOR) 40 MG tablet Take 40 mg by mouth daily.    Marland Kitchen b complex vitamins tablet Take 1 tablet by mouth daily.    Marland Kitchen JARDIANCE 25 MG TABS tablet TK 1 T PO QAM  0  . lisinopril (PRINIVIL,ZESTRIL) 20 MG tablet TAKE 1 TABLET BY MOUTH EVERY DAY. DISCONTINUE LISINOPRIL/HCTZ COMBINATION 30 tablet 0   No current facility-administered medications for this visit.    REVIEW OF SYSTEMS:   A 10+ POINT REVIEW OF SYSTEMS WAS OBTAINED including neurology, dermatology, psychiatry, cardiac, respiratory, lymph, extremities, GI, GU, Musculoskeletal, constitutional, breasts, reproductive, HEENT.  All pertinent positives are noted in the HPI.  All others are negative.   PHYSICAL EXAMINATION: ECOG PERFORMANCE STATUS: 1 - Symptomatic but completely ambulatory   GENERAL:alert, in no acute distress and comfortable SKIN: no acute rashes, no significant lesions EYES: conjunctiva are pink and non-injected, sclera anicteric OROPHARYNX: MMM, no exudates, no oropharyngeal erythema or ulceration NECK: supple, no JVD LYMPH:  no palpable lymphadenopathy in the cervical, axillary or  inguinal regions LUNGS: clear to auscultation b/l with normal respiratory effort HEART: regular rate & rhythm ABDOMEN:  normoactive bowel sounds , non tender, not distended. No palpable hepatosplenomegaly.  Extremity: no pedal edema PSYCH: alert & oriented x 3 with fluent speech NEURO: no focal motor/sensory deficits  LABORATORY DATA:  I have reviewed the data as listed  . CBC Latest Ref Rng & Units 08/11/2019 05/05/2019 11/26/2018  WBC 4.0 - 10.5 K/uL 7.4 9.4 11.1(H)  Hemoglobin 13.0 - 17.0 g/dL 15.7 14.9 15.5  Hematocrit 39.0 - 52.0 % 47.3 45.3 47.3  Platelets 150 - 400 K/uL 237 253 264  ANC 300  . CMP Latest Ref Rng & Units 08/11/2019 05/05/2019 11/26/2018  Glucose 70 - 99 mg/dL 151(H) 127(H) 158(H)  BUN 6 - 20 mg/dL 11 16 20   Creatinine 0.61 - 1.24 mg/dL 0.80 0.83 1.00  Sodium 135 - 145 mmol/L 138 139 136  Potassium 3.5 - 5.1 mmol/L 4.6 4.2 4.2  Chloride 98 - 111 mmol/L 101 100 100  CO2 22 - 32 mmol/L 28 26 24   Calcium 8.9 - 10.3 mg/dL 9.5 8.9 9.9  Total Protein 6.5 - 8.1 g/dL 7.3 7.4 7.8  Total Bilirubin 0.3 - 1.2 mg/dL 0.8 0.3 0.4  Alkaline Phos 38 - 126 U/L 91 113 121  AST 15 - 41 U/L 17 14(L) 16  ALT 0 - 44 U/L 26 18 21     Pathology:   11/25/17 Tissue Flow Cytometry:    Component     Latest Ref Rng & Units 05/05/2019  hCG Quant     0 - 3 mIU/mL <1  AFP, Serum, Tumor Marker     0.0 - 8.3 ng/mL 2.0  LDH     98 - 192 U/L 176     RADIOGRAPHIC STUDIES: I have personally reviewed the radiological images as listed and agreed with the findings in the report. No results found.  ASSESSMENT & PLAN:   59 y.o. male with  1. 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 Prostate enlargement.  11/17/17 PET/CT revealed Isolated hypermetabolic  retroperitoneal lymphadenopathy. Findings suspicious for lymphoma as no primary  lesion is identified to suggest this is metastatic adenopathy. Recommend retroperitoneal biopsy. The largest left-sided node appears necrotic and is actually decreased in size since the prior CT scan. It appeared inflamed on the prior study and may have infarcted. I would not recommend biopsying this lesion. No adenopathy in the neck, axilla, chest, pelvis or inguinal regions. No worrisome pulmonary lesions or osseous lesions.  11/25/17 pathology results revealed  finding of metastatic seminoma    12/04/17 US Scrotum w/doppler revealed Negative scrotal ultrasound with no distinct testicular mass Identified. 2. 5 mm simple left epididymal cyst, almost certainly benign and felt to be incidental in nature.   S/p 4 cycles of EP treatment, tolerated well overall  04/09/18 CT C/A/P revealed "no metastatic adenopathy in the chest. Abdomen/Pelvis: 1. Interval reduction in volume of LEFT periaortic metastatic lymph node. 2. No new adenopathy in the abdomen pelvis. 3. Benign-appearing splenic in LEFT renal cystic lesions."  11/26/2018 CT CAP revealed "1. Stable mild left abdominal retroperitoneal lymphadenopathy. 2. No new or progressive metastatic disease within the chest, abdomen, or pelvis. 3. Stable mildly enlarged prostate."  2. Chemotherapy related neutropenia -- resolved.  PLAN:  -Discussed pt labwork, 08/11/19; blood counts and chemistries are nml, LDH is WNL, AFP is WNL, hCG is WNL -The pt shows no clinical or lab progression/return of his extranodal seminoma at this time.  -No indication for further treatment at this time.  -Pt is just over 18 months out from last treatment (Nov of 2019).  -Plan to repeat scans at two year mark, unless new symptoms or concerns.  -If labs and symptoms are stable at next visit will move to 6 month follow-ups.  -Will see the pt back in 4 months with labs    FOLLOW UP: RTC with Dr.  Irene Limbo in 16 weeks CT C/A/P  in 14 weeks with labs   The total time spent in the appt was 20 minutes and more than 50% was on counseling and direct patient cares.  All of the patient's questions were answered with apparent satisfaction. The patient knows to call the clinic with any problems, questions or concerns.    Sullivan Lone MD [place] AAHIVMS [facility] Hematology/Oncology Physician [facility]  (Office):       [phone] (Work cell):  [phone] (Fax):           [phone]  08/25/2019 2:45 PM  I, Yevette Edwards, am acting as a scribe for Dr. Sullivan Lone.   .I have reviewed the above documentation for accuracy and completeness, and I agree with the above. Brunetta Genera MD

## 2019-08-31 DIAGNOSIS — C6290 Malignant neoplasm of unspecified testis, unspecified whether descended or undescended: Secondary | ICD-10-CM | POA: Diagnosis not present

## 2019-08-31 DIAGNOSIS — I10 Essential (primary) hypertension: Secondary | ICD-10-CM | POA: Diagnosis not present

## 2019-08-31 DIAGNOSIS — E1169 Type 2 diabetes mellitus with other specified complication: Secondary | ICD-10-CM | POA: Diagnosis not present

## 2019-08-31 DIAGNOSIS — E782 Mixed hyperlipidemia: Secondary | ICD-10-CM | POA: Diagnosis not present

## 2019-09-08 DIAGNOSIS — N5201 Erectile dysfunction due to arterial insufficiency: Secondary | ICD-10-CM | POA: Diagnosis not present

## 2019-09-08 DIAGNOSIS — N500 Atrophy of testis: Secondary | ICD-10-CM | POA: Diagnosis not present

## 2019-09-08 DIAGNOSIS — R972 Elevated prostate specific antigen [PSA]: Secondary | ICD-10-CM | POA: Diagnosis not present

## 2019-09-15 DIAGNOSIS — N500 Atrophy of testis: Secondary | ICD-10-CM | POA: Diagnosis not present

## 2019-09-15 DIAGNOSIS — R972 Elevated prostate specific antigen [PSA]: Secondary | ICD-10-CM | POA: Diagnosis not present

## 2019-09-17 ENCOUNTER — Encounter: Payer: Self-pay | Admitting: Hematology

## 2019-09-28 ENCOUNTER — Encounter: Payer: Self-pay | Admitting: Hematology

## 2019-09-29 ENCOUNTER — Other Ambulatory Visit: Payer: Self-pay | Admitting: Urology

## 2019-09-29 DIAGNOSIS — R972 Elevated prostate specific antigen [PSA]: Secondary | ICD-10-CM

## 2019-10-12 DIAGNOSIS — H40013 Open angle with borderline findings, low risk, bilateral: Secondary | ICD-10-CM | POA: Diagnosis not present

## 2019-10-20 ENCOUNTER — Ambulatory Visit
Admission: RE | Admit: 2019-10-20 | Discharge: 2019-10-20 | Disposition: A | Discharge status: Home / Self Care | Payer: BC Managed Care – PPO | Source: Ambulatory Visit | Attending: Urology | Admitting: Urology

## 2019-10-20 ENCOUNTER — Other Ambulatory Visit: Payer: Self-pay

## 2019-10-20 DIAGNOSIS — R972 Elevated prostate specific antigen [PSA]: Secondary | ICD-10-CM | POA: Diagnosis not present

## 2019-10-20 MED ORDER — GADOBENATE DIMEGLUMINE 529 MG/ML IV SOLN
20.0000 mL | Freq: Once | INTRAVENOUS | Status: AC | PRN
  Administered 2019-10-20: 20 mL via INTRAVENOUS

## 2019-11-16 ENCOUNTER — Encounter: Payer: Self-pay | Admitting: Hematology

## 2019-12-01 ENCOUNTER — Other Ambulatory Visit: Payer: BC Managed Care – PPO

## 2019-12-10 ENCOUNTER — Other Ambulatory Visit: Payer: Self-pay

## 2019-12-10 ENCOUNTER — Ambulatory Visit (HOSPITAL_COMMUNITY)
Admission: RE | Admit: 2019-12-10 | Discharge: 2019-12-10 | Disposition: A | Discharge status: Home / Self Care | Payer: BC Managed Care – PPO | Source: Ambulatory Visit | Attending: Hematology | Admitting: Hematology

## 2019-12-10 ENCOUNTER — Inpatient Hospital Stay: Payer: BC Managed Care – PPO | Attending: Hematology

## 2019-12-10 DIAGNOSIS — C6290 Malignant neoplasm of unspecified testis, unspecified whether descended or undescended: Secondary | ICD-10-CM | POA: Diagnosis not present

## 2019-12-10 DIAGNOSIS — I700 Atherosclerosis of aorta: Secondary | ICD-10-CM | POA: Diagnosis not present

## 2019-12-10 LAB — CMP (CANCER CENTER ONLY)
ALT: 17 U/L (ref 0–44)
AST: 16 U/L (ref 15–41)
Albumin: 4.2 g/dL (ref 3.5–5.0)
Alkaline Phosphatase: 103 U/L (ref 38–126)
Anion gap: 8 (ref 5–15)
BUN: 17 mg/dL (ref 6–20)
CO2: 28 mmol/L (ref 22–32)
Calcium: 10.1 mg/dL (ref 8.9–10.3)
Chloride: 100 mmol/L (ref 98–111)
Creatinine: 0.85 mg/dL (ref 0.61–1.24)
GFR, Est AFR Am: >60 mL/min (ref >60)
GFR, Estimated: >60 mL/min (ref >60)
Glucose, Bld: 145 mg/dL — ABNORMAL HIGH (ref 70–99)
Potassium: 4.5 mmol/L (ref 3.5–5.1)
Sodium: 136 mmol/L (ref 135–145)
Total Bilirubin: 0.8 mg/dL (ref 0.3–1.2)
Total Protein: 7.5 g/dL (ref 6.5–8.1)

## 2019-12-10 LAB — CBC WITH DIFFERENTIAL/PLATELET
Abs Immature Granulocytes: 0.02 10*3/uL (ref 0.00–0.07)
Basophils Absolute: 0 10*3/uL (ref 0.0–0.1)
Basophils Relative: 0 %
Eosinophils Absolute: 0.1 10*3/uL (ref 0.0–0.5)
Eosinophils Relative: 2 %
HCT: 46.7 % (ref 39.0–52.0)
Hemoglobin: 15.6 g/dL (ref 13.0–17.0)
Immature Granulocytes: 0 %
Lymphocytes Relative: 28 %
Lymphs Abs: 1.9 10*3/uL (ref 0.7–4.0)
MCH: 29.3 pg (ref 26.0–34.0)
MCHC: 33.4 g/dL (ref 30.0–36.0)
MCV: 87.6 fL (ref 80.0–100.0)
Monocytes Absolute: 0.7 10*3/uL (ref 0.1–1.0)
Monocytes Relative: 11 %
Neutro Abs: 4.1 10*3/uL (ref 1.7–7.7)
Neutrophils Relative %: 59 %
Platelets: 226 10*3/uL (ref 150–400)
RBC: 5.33 MIL/uL (ref 4.22–5.81)
RDW: 12.5 % (ref 11.5–15.5)
WBC: 6.9 10*3/uL (ref 4.0–10.5)
nRBC: 0 % (ref 0.0–0.2)

## 2019-12-10 LAB — LACTATE DEHYDROGENASE: LDH: 169 U/L (ref 98–192)

## 2019-12-10 MED ORDER — SODIUM CHLORIDE (PF) 0.9 % IJ SOLN
INTRAMUSCULAR | Status: AC
  Filled 2019-12-10: qty 50

## 2019-12-10 MED ORDER — IOHEXOL 300 MG/ML  SOLN
100.0000 mL | Freq: Once | INTRAMUSCULAR | Status: AC | PRN
  Administered 2019-12-10: 100 mL via INTRAVENOUS

## 2019-12-11 LAB — BETA HCG QUANT (REF LAB): hCG Quant: <1 m[IU]/mL (ref 0–3)

## 2019-12-11 LAB — AFP TUMOR MARKER: AFP, Serum, Tumor Marker: 1.8 ng/mL (ref 0.0–8.3)

## 2019-12-15 ENCOUNTER — Telehealth: Payer: Self-pay | Admitting: Hematology

## 2019-12-15 ENCOUNTER — Other Ambulatory Visit: Payer: Self-pay

## 2019-12-15 ENCOUNTER — Inpatient Hospital Stay: Payer: BC Managed Care – PPO | Attending: Hematology | Admitting: Hematology

## 2019-12-15 ENCOUNTER — Inpatient Hospital Stay: Payer: BC Managed Care – PPO

## 2019-12-15 VITALS — BP 124/75 | HR 89 | Temp 97.1°F | Resp 18 | Ht 72.0 in | Wt 262.4 lb

## 2019-12-15 DIAGNOSIS — C6290 Malignant neoplasm of unspecified testis, unspecified whether descended or undescended: Secondary | ICD-10-CM | POA: Diagnosis not present

## 2019-12-15 DIAGNOSIS — Z23 Encounter for immunization: Secondary | ICD-10-CM

## 2019-12-15 NOTE — Telephone Encounter (Signed)
Scheduled per 9/1 los. No avs or calendar needed to be printed. Pt is aware of appt time and date and is mychart active.

## 2019-12-15 NOTE — Progress Notes (Signed)
HEMATOLOGY/ONCOLOGY CLNIC NOTE  Date of Service: 12/15/2019  Patient Care Team: Antony Contras, MD as PCP - General (Family Medicine)  CHIEF COMPLAINTS/PURPOSE OF CONSULTATION:  F/u for extragondal seminoma  HISTORY OF PRESENTING ILLNESS:   Neil Berg is a wonderful 59 y.o. male who has been referred to [place] by Dr. Antony Contras for evaluation and management of Lymphadenopathy. He is accompanied today by his wife. The pt reports that he is doing well overall and [place]   The pt reports that he hasn't had any pain in over a week and recently appeared to [facility] on 10/17/17. He notes that he woke up on the morning of 10/17/17 with significant, unique, back and kidney pain which began to spread around to the RUQ of his abdomen as the morning progressed. He notes that his pain went away after taking pain medications for 5 days, and describes that the pain was waxing and waning. He also endorses some positional exacerbation to his pain. He denies any nausea, vomiting, diarrhea, changes in bowel habits, testicular pain or swelling, difficulty/changes in urination at the time or since then.    The pt also notes that  In the week of these symptoms he felt very cold, which is different for him as he describes himself as very warm natured. He also notes that his appetite weakened and he lost about 10 pounds, and was abiding by the [employer]. He notes that his appetite has returned and he is eating normally again. He denies any pain associated with meals. He denies recent exotic or international travel  He takes Jardiance for his DM. He notes that his PSA spiked two years ago, had a biopsy, and his PSA counts have decreased recently and is believed to be BPH.    He also notes that he had a recurring rash on his calves, worse on the left, that was red and flat, mimicking his sock impression, but extending further up his leg. He notes that this happened over  3 months, and that it came and went without any discernable pattern. He denies any associated itching.   He also notes a place on the front of his left lower leg that has been slightly swollen for the past a week. He denies remembering running into anything, bruising, or pain.   Of note prior to the patient's visit today, pt has had CT A/P completed on 10/14/17 with results revealing Retroperitoneal lymphadenopathy, highly suspicious for lymphoma or metastatic disease. 2. Prostate enlargement 3.  Aortic Atherosclerosis.   Most recent lab results (10/17/17) of CBC w/diff is as follows: all values are WNL except for WBC at 13.9k, RBC at 5.83, ANC at 10.0k, Monocytes abs at 1.4k.  On review of systems, pt reports recent back pain, feeling cold recently, recent abdominal pain, and denies nausea, vomiting, diarrhea, changes in bowel habits, difficulty/changes in urination, blood in the urine, abdominal trauma, constipation, fevers, night sweats, chills, testicular pain or swelling, noticing any lumps or bumps, unexpected weight loss, and any other symptoms.   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  On Social Hx the pt reports working in [employer] and denies chemical/radiaiton exposure. He denies ever smoking and other drug use.  Interval History:   Neil Berg returns today for management and evaluation after completing 4 cycles of  EP treatment of his Stage II extra-gonadal retroperitoneal Seminoma. The patient's last visit with [place] was on 08/25/2019. The pt reports that he is doing well overall.  The pt reports that his back pain is stable. It is a two on the pain scale, but is somewhat persistent. Pt has been back to work full time for over a year. He continues making time for himself and family. Pt walks 3-8 miles per day in addition to the walking required at this  job. Pt has received the COVID19 vaccines and is interested in the booster.  Of note since the patient's last visit, pt has had CT C/A/P ([id number]) ([id number]) completed on 12/10/2019 with results revealing "1. Stable left retroperitoneal lymphadenopathy. No other new signs of metastatic disease noted elsewhere in the chest, abdomen or pelvis. 2. Aortic atherosclerosis, in addition to left main and left anterior descending coronary artery disease. Please note that although the presence of coronary artery calcium documents the presence of coronary artery disease, the severity of this disease and any potential stenosis cannot be assessed on this non-gated CT examination."  Lab results (12/10/19) of CBC w/diff and CMP is as follows: all values are WNL except for Glucose at 145. 12/10/2019 AFP tumor marker at 1.8 12/10/2019 hCG Quant at <1 12/10/2019 LDH at 169  On review of systems, pt reports chronic back pain and denies fevers, chills, low appetite, fatigue and any other symptoms.   MEDICAL HISTORY:  Past Medical History:  Diagnosis Date  . Chest pain   . Diabetes mellitus without complication ([place])   . Hypertension   . Obesity   Type II Diabetes Mellitus   SURGICAL HISTORY: Past Surgical History:  Procedure Laterality Date  . EYE SURGERY     left eye cataract removal / implant   . IR IMAGING GUIDED PORT INSERTION  12/11/2017  . IR REMOVAL TUN ACCESS W/ PORT W/O FL MOD SED  05/01/2018  . TONSILLECTOMY      Tonsillectomy 1974 Cataract surgery-left eye 04/2015 Colonoscopy 06/04/16  SOCIAL HISTORY: Social History   Socioeconomic History  . Marital status: Married    Spouse name: Not on file  . Number of children: Not on file  . Years of education: Not on file  . Highest education level: Not on file  Occupational History  . Not on file  Tobacco Use  . Smoking status: Never Smoker  . Smokeless tobacco: Never Used  Vaping Use  . Vaping Use: Never used  Substance and Sexual  Activity  . Alcohol use: No  . Drug use: No  . Sexual activity: Not on file  Other Topics Concern  . Not on file  Social History Narrative  . Not on file   Social Determinants of Health   Financial Resource Strain:   . Difficulty of Paying Living Expenses: Not on file  Food Insecurity:   . Worried About Charity fundraiser in the Last Year: Not on file  . Ran Out of Food in the Last Year: Not on file  Transportation Needs:   . Lack of Transportation (Medical): Not on file  . Lack of Transportation (Non-Medical): Not on file  Physical Activity:   . Days of Exercise per Week: Not on file  . Minutes of Exercise per Session: Not on file  Stress:   . Feeling of Stress : Not on file  Social Connections:   . Frequency of Communication with Friends and Family: Not on file  . Frequency of Social Gatherings with  Friends and Family: Not on file  . Attends Religious Services: Not on file  . Active Member of Clubs or Organizations: Not on file  . Attends Archivist Meetings: Not on file  . Marital Status: Not on file  Intimate Partner Violence:   . Fear of Current or Ex-Partner: Not on file  . Emotionally Abused: Not on file  . Physically Abused: Not on file  . Sexually Abused: Not on file    FAMILY HISTORY: Family History  Problem Relation Age of Onset  . Heart disease Maternal Grandmother   . Heart attack Maternal Grandfather     ALLERGIES:  has No Known Allergies.  MEDICATIONS:  Current Outpatient Medications  Medication Sig Dispense Refill  . amLODipine (NORVASC) 5 MG tablet Take 1 tablet (5 mg total) by mouth daily. 30 tablet 11  . aspirin EC 81 MG tablet Take 81 mg by mouth daily.    Marland Kitchen atorvastatin (LIPITOR) 40 MG tablet Take 40 mg by mouth daily.    Marland Kitchen b complex vitamins tablet Take 1 tablet by mouth daily.    Marland Kitchen JARDIANCE 25 MG TABS tablet TK 1 T PO QAM  0  . lisinopril (PRINIVIL,ZESTRIL) 20 MG tablet TAKE 1 TABLET BY MOUTH EVERY DAY. DISCONTINUE  LISINOPRIL/HCTZ COMBINATION 30 tablet 0   No current facility-administered medications for this visit.    REVIEW OF SYSTEMS:   A 10+ POINT REVIEW OF SYSTEMS WAS OBTAINED including neurology, dermatology, psychiatry, cardiac, respiratory, lymph, extremities, GI, GU, Musculoskeletal, constitutional, breasts, reproductive, HEENT.  All pertinent positives are noted in the HPI.  All others are negative.   PHYSICAL EXAMINATION: ECOG PERFORMANCE STATUS: 1 - Symptomatic but completely ambulatory   GENERAL:alert, in no acute distress and comfortable SKIN: no acute rashes, no significant lesions EYES: conjunctiva are pink and non-injected, sclera anicteric OROPHARYNX: MMM, no exudates, no oropharyngeal erythema or ulceration NECK: supple, no JVD LYMPH:  no palpable lymphadenopathy in the cervical, axillary or inguinal regions LUNGS: clear to auscultation b/l with normal respiratory effort HEART: regular rate & rhythm ABDOMEN:  normoactive bowel sounds , non tender, not distended. No palpable hepatosplenomegaly.  Extremity: no pedal edema PSYCH: alert & oriented x 3 with fluent speech NEURO: no focal motor/sensory deficits  LABORATORY DATA:  I have reviewed the data as listed  . CBC Latest Ref Rng & Units 12/10/2019 08/11/2019 05/05/2019  WBC 4.0 - 10.5 K/uL 6.9 7.4 9.4  Hemoglobin 13.0 - 17.0 g/dL 15.6 15.7 14.9  Hematocrit 39 - 52 % 46.7 47.3 45.3  Platelets 150 - 400 K/uL 226 237 253  ANC 300  . CMP Latest Ref Rng & Units 12/10/2019 08/11/2019 05/05/2019  Glucose 70 - 99 mg/dL 145(H) 151(H) 127(H)  BUN 6 - 20 mg/dL 17 11 16   Creatinine 0.61 - 1.24 mg/dL 0.85 0.80 0.83  Sodium 135 - 145 mmol/L 136 138 139  Potassium 3.5 - 5.1 mmol/L 4.5 4.6 4.2  Chloride 98 - 111 mmol/L 100 101 100  CO2 22 - 32 mmol/L 28 28 26   Calcium 8.9 - 10.3 mg/dL 10.1 9.5 8.9  Total Protein 6.5 - 8.1 g/dL 7.5 7.3 7.4  Total Bilirubin 0.3 - 1.2 mg/dL 0.8 0.8 0.3  Alkaline Phos 38 - 126 U/L 103 91 113  AST 15 -  41 U/L 16 17 14(L)  ALT 0 - 44 U/L 17 26 18     Pathology:   11/25/17 Tissue Flow Cytometry:    Component     Latest Ref Rng &  Units 05/05/2019  hCG Quant     0 - 3 mIU/mL <1  AFP, Serum, Tumor Marker     0.0 - 8.3 ng/mL 2.0  LDH     98 - 192 U/L 176     RADIOGRAPHIC STUDIES: I have personally reviewed the radiological images as listed and agreed with the findings in the report. CT Chest W Contrast  Result Date: 12/10/2019 CLINICAL DATA:  59 year old male with history of testicular cancer. Follow-up study. EXAM: CT CHEST, ABDOMEN, AND PELVIS WITH CONTRAST TECHNIQUE: Multidetector CT imaging of the chest, abdomen and pelvis was performed following the standard protocol during bolus administration of intravenous contrast. CONTRAST:  129mL OMNIPAQUE IOHEXOL 300 MG/ML  SOLN COMPARISON:  CT the chest, abdomen and pelvis 11/26/2018. FINDINGS: CT CHEST FINDINGS Cardiovascular: Heart size is normal. There is no significant pericardial fluid, thickening or pericardial calcification. There is aortic atherosclerosis, as well as atherosclerosis of the great vessels of the mediastinum and the coronary arteries, including calcified atherosclerotic plaque in the left main and left anterior descending coronary arteries. Mediastinum/Nodes: No pathologically enlarged mediastinal or hilar lymph nodes. Esophagus is unremarkable in appearance. No axillary lymphadenopathy. Lungs/Pleura: No suspicious appearing pulmonary nodules or masses are noted. No acute consolidative airspace disease. No pleural effusions. Musculoskeletal: There are no aggressive appearing lytic or blastic lesions noted in the visualized portions of the skeleton. CT ABDOMEN PELVIS FINDINGS Hepatobiliary: Subcentimeter low-attenuation lesion in the right lobe of the liver between segments 5 and 6 (axial image 61 of series 2), too small to characterize, but stable in retrospect compared to the prior examination, favored to represent a benign  lesions such as a tiny cyst. No other suspicious hepatic lesions. No intra or extrahepatic biliary ductal dilatation. Gallbladder is normal in appearance. Pancreas: No pancreatic mass. No pancreatic ductal dilatation. No pancreatic or peripancreatic fluid collections or inflammatory changes. Spleen: Unremarkable. Adrenals/Urinary Tract: Multiple low-attenuation lesions in the left kidney compatible with simple cysts. Other subcentimeter low-attenuation lesions in the right kidney, too small to characterize, but similar to prior studies and statistically likely to represent tiny cysts. No suspicious renal lesions. Bilateral adrenal glands are normal in appearance. No hydroureteronephrosis. Urinary bladder is normal in appearance. Stomach/Bowel: Normal appearance of the stomach. No pathologic dilatation of small bowel or colon. Normal appendix. Vascular/Lymphatic: Aortic atherosclerosis, without evidence of aneurysm or dissection in the abdominal or pelvic vasculature. Mildly enlarged left para-aortic lymph node measuring 1.5 cm in short axis (axial image 81 of series 2), and other prominent borderline enlarged left para-aortic lymph nodes, stable compared to the prior examination. No new lymphadenopathy noted elsewhere in the abdomen or pelvis. Reproductive: Prostate gland and seminal vesicles are unremarkable in appearance. Other: No significant volume of ascites.  No pneumoperitoneum. Musculoskeletal: There are no aggressive appearing lytic or blastic lesions noted in the visualized portions of the skeleton. IMPRESSION: 1. Stable left retroperitoneal lymphadenopathy. No other new signs of metastatic disease noted elsewhere in the chest, abdomen or pelvis. 2. Aortic atherosclerosis, in addition to left main and left anterior descending coronary artery disease. Please note that although the presence of coronary artery calcium documents the presence of coronary artery disease, the severity of this disease and any  potential stenosis cannot be assessed on this non-gated CT examination. Assessment for potential risk factor modification, dietary therapy or pharmacologic therapy may be warranted, if clinically indicated. 3. Additional incidental findings, as above. Electronically Signed   By: Vinnie Langton M.D.   On: 12/10/2019 09:41   CT  Abdomen Pelvis W Contrast  Result Date: 12/10/2019 CLINICAL DATA:  59 year old male with history of testicular cancer. Follow-up study. EXAM: CT CHEST, ABDOMEN, AND PELVIS WITH CONTRAST TECHNIQUE: Multidetector CT imaging of the chest, abdomen and pelvis was performed following the standard protocol during bolus administration of intravenous contrast. CONTRAST:  154mL OMNIPAQUE IOHEXOL 300 MG/ML  SOLN COMPARISON:  CT the chest, abdomen and pelvis 11/26/2018. FINDINGS: CT CHEST FINDINGS Cardiovascular: Heart size is normal. There is no significant pericardial fluid, thickening or pericardial calcification. There is aortic atherosclerosis, as well as atherosclerosis of the great vessels of the mediastinum and the coronary arteries, including calcified atherosclerotic plaque in the left main and left anterior descending coronary arteries. Mediastinum/Nodes: No pathologically enlarged mediastinal or hilar lymph nodes. Esophagus is unremarkable in appearance. No axillary lymphadenopathy. Lungs/Pleura: No suspicious appearing pulmonary nodules or masses are noted. No acute consolidative airspace disease. No pleural effusions. Musculoskeletal: There are no aggressive appearing lytic or blastic lesions noted in the visualized portions of the skeleton. CT ABDOMEN PELVIS FINDINGS Hepatobiliary: Subcentimeter low-attenuation lesion in the right lobe of the liver between segments 5 and 6 (axial image 61 of series 2), too small to characterize, but stable in retrospect compared to the prior examination, favored to represent a benign lesions such as a tiny cyst. No other suspicious hepatic lesions. No  intra or extrahepatic biliary ductal dilatation. Gallbladder is normal in appearance. Pancreas: No pancreatic mass. No pancreatic ductal dilatation. No pancreatic or peripancreatic fluid collections or inflammatory changes. Spleen: Unremarkable. Adrenals/Urinary Tract: Multiple low-attenuation lesions in the left kidney compatible with simple cysts. Other subcentimeter low-attenuation lesions in the right kidney, too small to characterize, but similar to prior studies and statistically likely to represent tiny cysts. No suspicious renal lesions. Bilateral adrenal glands are normal in appearance. No hydroureteronephrosis. Urinary bladder is normal in appearance. Stomach/Bowel: Normal appearance of the stomach. No pathologic dilatation of small bowel or colon. Normal appendix. Vascular/Lymphatic: Aortic atherosclerosis, without evidence of aneurysm or dissection in the abdominal or pelvic vasculature. Mildly enlarged left para-aortic lymph node measuring 1.5 cm in short axis (axial image 81 of series 2), and other prominent borderline enlarged left para-aortic lymph nodes, stable compared to the prior examination. No new lymphadenopathy noted elsewhere in the abdomen or pelvis. Reproductive: Prostate gland and seminal vesicles are unremarkable in appearance. Other: No significant volume of ascites.  No pneumoperitoneum. Musculoskeletal: There are no aggressive appearing lytic or blastic lesions noted in the visualized portions of the skeleton. IMPRESSION: 1. Stable left retroperitoneal lymphadenopathy. No other new signs of metastatic disease noted elsewhere in the chest, abdomen or pelvis. 2. Aortic atherosclerosis, in addition to left main and left anterior descending coronary artery disease. Please note that although the presence of coronary artery calcium documents the presence of coronary artery disease, the severity of this disease and any potential stenosis cannot be assessed on this non-gated CT examination.  Assessment for potential risk factor modification, dietary therapy or pharmacologic therapy may be warranted, if clinically indicated. 3. Additional incidental findings, as above. Electronically Signed   By: Vinnie Langton M.D.   On: 12/10/2019 09:41    ASSESSMENT & PLAN:   59 y.o. male with  1. 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 Prostate enlargement.  11/17/17 PET/CT revealed Isolated hypermetabolic retroperitoneal lymphadenopathy. Findings suspicious for lymphoma  as no primary lesion is identified to suggest this is metastatic adenopathy. Recommend retroperitoneal biopsy. The largest left-sided node appears necrotic and is actually decreased in size since the prior CT scan. It appeared inflamed on the prior study and may have infarcted. I would not recommend biopsying this lesion. No adenopathy in the neck, axilla, chest, pelvis or inguinal regions. No worrisome pulmonary lesions or osseous lesions.  11/25/17 pathology results revealed  finding of metastatic seminoma    12/04/17 US Scrotum w/doppler revealed Negative scrotal ultrasound with no distinct testicular mass Identified. 2. 5 mm simple left epididymal cyst, almost certainly benign and felt to be incidental in nature.   S/p 4 cycles of EP treatment, tolerated well overall  04/09/18 CT C/A/P revealed "no metastatic adenopathy in the chest. Abdomen/Pelvis: 1. Interval reduction in volume of LEFT periaortic metastatic lymph node. 2. No new adenopathy in the abdomen pelvis. 3. Benign-appearing splenic in LEFT renal cystic lesions."  11/26/2018 CT CAP revealed "1. Stable mild left abdominal retroperitoneal lymphadenopathy. 2. No new or progressive metastatic disease within the chest, abdomen, or pelvis. 3.  Stable mildly enlarged prostate."  2. Chemotherapy related neutropenia -- resolved.  PLAN:  -Discussed pt labwork, 12/10/19; blood counts & chemistries are nml; AFP, hCG, & LDH are WNL -Discussed 12/10/2019 CT C/A/P ([id number]) ([id number]) which revealed "1. Stable left retroperitoneal lymphadenopathy. No other new signs of metastatic disease noted elsewhere in the chest, abdomen or pelvis. 2. Aortic atherosclerosis, in addition to left main and left anterior descending coronary artery disease. -No lab, clinical, or radiographic evidence of extranodal seminoma progression/recurrence at this time.  -No indication for further treatment at this time. Will continue watchful observation every 6 months. -Recommend pt receive COVID19 vaccine booster. Plan to give in clinic. -Recommend pt receive the Shingles vaccine with PCP. He has a Hx of chickenpox. -Recommend pt receive the annual flu vaccine when available. -Will see the pt back in 6 months with labs   FOLLOW UP: RTC with Dr. Irene Limbo with labs in 6 months Plz schedule for COVID booster vaccine   The total time spent in the appt was 20 minutes and more than 50% was on counseling and direct patient cares.  All of the patient's questions were answered with apparent satisfaction. The patient knows to call the clinic with any problems, questions or concerns.   Sullivan Lone MD [place] AAHIVMS [facility] Hematology/Oncology Physician [facility]  (Office):       [phone] (Work cell):  [phone] (Fax):           [phone]  12/15/2019 2:49 PM  I, Yevette Edwards, am acting as a scribe for Dr. Sullivan Lone.   .I have reviewed the above documentation for accuracy and completeness, and I agree with the above. Brunetta Genera MD

## 2020-02-29 DIAGNOSIS — Z125 Encounter for screening for malignant neoplasm of prostate: Secondary | ICD-10-CM | POA: Diagnosis not present

## 2020-02-29 DIAGNOSIS — E782 Mixed hyperlipidemia: Secondary | ICD-10-CM | POA: Diagnosis not present

## 2020-02-29 DIAGNOSIS — Z23 Encounter for immunization: Secondary | ICD-10-CM | POA: Diagnosis not present

## 2020-02-29 DIAGNOSIS — Z0000 Encounter for general adult medical examination without abnormal findings: Secondary | ICD-10-CM | POA: Diagnosis not present

## 2020-02-29 DIAGNOSIS — E1169 Type 2 diabetes mellitus with other specified complication: Secondary | ICD-10-CM | POA: Diagnosis not present

## 2020-04-04 ENCOUNTER — Telehealth: Payer: Self-pay | Admitting: Hematology

## 2020-04-04 NOTE — Telephone Encounter (Signed)
Rescheduled 12/28 appointment to 1/21 per provider pal, patient has been called and notified.

## 2020-04-10 ENCOUNTER — Other Ambulatory Visit: Payer: BC Managed Care – PPO

## 2020-04-10 ENCOUNTER — Ambulatory Visit: Payer: BC Managed Care – PPO | Admitting: Hematology

## 2020-05-04 NOTE — Progress Notes (Signed)
HEMATOLOGY/ONCOLOGY CLNIC NOTE  Date of Service: 05/04/2020  Patient Care Team: Antony Contras, MD as PCP - General (Family Medicine)  CHIEF COMPLAINTS/PURPOSE OF CONSULTATION:  F/u for extragondal seminoma  HISTORY OF PRESENTING ILLNESS:   Neil Berg is a wonderful 60 y.o. male who has been referred to [place] by Dr. Antony Contras for evaluation and management of Lymphadenopathy. He is accompanied today by his wife. The pt reports that he is doing well overall and [place]   The pt reports that he hasn't had any pain in over a week and recently appeared to [facility] on 10/17/17. He notes that he woke up on the morning of 10/17/17 with significant, unique, back and kidney pain which began to spread around to the RUQ of his abdomen as the morning progressed. He notes that his pain went away after taking pain medications for 5 days, and describes that the pain was waxing and waning. He also endorses some positional exacerbation to his pain. He denies any nausea, vomiting, diarrhea, changes in bowel habits, testicular pain or swelling, difficulty/changes in urination at the time or since then.    The pt also notes that  In the week of these symptoms he felt very cold, which is different for him as he describes himself as very warm natured. He also notes that his appetite weakened and he lost about 10 pounds, and was abiding by the [employer]. He notes that his appetite has returned and he is eating normally again. He denies any pain associated with meals. He denies recent exotic or international travel  He takes Jardiance for his DM. He notes that his PSA spiked two years ago, had a biopsy, and his PSA counts have decreased recently and is believed to be BPH.    He also notes that he had a recurring rash on his calves, worse on the left, that was red and flat, mimicking his sock impression, but extending further up his leg. He notes that this happened over  3 months, and that it came and went without any discernable pattern. He denies any associated itching.   He also notes a place on the front of his left lower leg that has been slightly swollen for the past a week. He denies remembering running into anything, bruising, or pain.   Of note prior to the patient's visit today, pt has had CT A/P completed on 10/14/17 with results revealing Retroperitoneal lymphadenopathy, highly suspicious for lymphoma or metastatic disease. 2. Prostate enlargement 3.  Aortic Atherosclerosis.   Most recent lab results (10/17/17) of CBC w/diff is as follows: all values are WNL except for WBC at 13.9k, RBC at 5.83, ANC at 10.0k, Monocytes abs at 1.4k.  On review of systems, pt reports recent back pain, feeling cold recently, recent abdominal pain, and denies nausea, vomiting, diarrhea, changes in bowel habits, difficulty/changes in urination, blood in the urine, abdominal trauma, constipation, fevers, night sweats, chills, testicular pain or swelling, noticing any lumps or bumps, unexpected weight loss, and any other symptoms.   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  On Social Hx the pt reports working in [employer] and denies chemical/radiaiton exposure. He denies ever smoking and other drug use.  Interval History:   Neil Berg returns today for management and evaluation after completing 4 cycles of  EP treatment of his Stage II extra-gonadal retroperitoneal Seminoma. The patient's last visit with [place] was on 12/15/2019. The pt reports that he is doing well overall.  The pt reports no new concerns or symptoms. He notes that his energy level are 80% baseline, as he experience general fatigue toward the middle of the day.  Lab results today 05/05/2020 of CBC w/diff and CMP is as follows: all values are WNL except for Glucose at  135. 05/05/2020 LDH at 166. 05/05/2020 Beta HCG, Quant <1 05/05/2020 AFP Tumor Marker 1.9  On review of systems, pt denies abdominal pain, SOB, changes in bowel habits, unexpected weight loss, dysuria, neuropathy, fatigue, unexplained headaches, vision changes, back pain and any other symptoms.   MEDICAL HISTORY:  Past Medical History:  Diagnosis Date  . Chest pain   . Diabetes mellitus without complication ([address])   . Hypertension   . Obesity   Type II Diabetes Mellitus   SURGICAL HISTORY: Past Surgical History:  Procedure Laterality Date  . EYE SURGERY     left eye cataract removal / implant   . IR IMAGING GUIDED PORT INSERTION  12/11/2017  . IR REMOVAL TUN ACCESS W/ PORT W/O FL MOD SED  05/01/2018  . TONSILLECTOMY      Tonsillectomy 1974 Cataract surgery-left eye 04/2015 Colonoscopy 06/04/16  SOCIAL HISTORY: Social History   Socioeconomic History  . Marital status: Married    Spouse name: Not on file  . Number of children: Not on file  . Years of education: Not on file  . Highest education level: Not on file  Occupational History  . Not on file  Tobacco Use  . Smoking status: Never Smoker  . Smokeless tobacco: Never Used  Vaping Use  . Vaping Use: Never used  Substance and Sexual Activity  . Alcohol use: No  . Drug use: No  . Sexual activity: Not on file  Other Topics Concern  . Not on file  Social History Narrative  . Not on file   Social Determinants of Health   Financial Resource Strain: Not on file  Food Insecurity: Not on file  Transportation Needs: Not on file  Physical Activity: Not on file  Stress: Not on file  Social Connections: Not on file  Intimate Partner Violence: Not on file    FAMILY HISTORY: Family History  Problem Relation Age of Onset  . Heart disease Maternal Grandmother   . Heart attack Maternal Grandfather     ALLERGIES:  has No Known Allergies.  MEDICATIONS:  Current Outpatient Medications  Medication Sig Dispense  Refill  . amLODipine (NORVASC) 5 MG tablet Take 1 tablet (5 mg total) by mouth daily. 30 tablet 11  . aspirin EC 81 MG tablet Take 81 mg by mouth daily.    Marland Kitchen atorvastatin (LIPITOR) 40 MG tablet Take 40 mg by mouth daily.    Marland Kitchen b complex vitamins tablet Take 1 tablet by mouth daily.    Marland Kitchen JARDIANCE 25 MG TABS tablet TK 1 T PO QAM  0  . lisinopril (PRINIVIL,ZESTRIL) 20 MG tablet TAKE 1 TABLET BY MOUTH EVERY DAY. DISCONTINUE LISINOPRIL/HCTZ COMBINATION 30 tablet 0   No current facility-administered medications for this visit.    REVIEW OF SYSTEMS:   10 Point review of Systems was done is negative except as noted above.  PHYSICAL EXAMINATION: ECOG PERFORMANCE STATUS: 1 - Symptomatic but completely ambulatory  .BP 136/82   Pulse 84   Temp (!) 97 F (36.1 C) (Tympanic)   Resp 20  Ht 6' (1.829 m)   Wt 256 lb 14.4 oz (116.5 kg)   SpO2 96%   BMI 34.84 kg/m   GENERAL:alert, in no acute distress and comfortable SKIN: no acute rashes, no significant lesions EYES: conjunctiva are pink and non-injected, sclera anicteric OROPHARYNX: MMM, no exudates, no oropharyngeal erythema or ulceration NECK: supple, no JVD LYMPH:  no palpable lymphadenopathy in the cervical, axillary or inguinal regions LUNGS: clear to auscultation b/l with normal respiratory effort HEART: regular rate & rhythm ABDOMEN:  normoactive bowel sounds , non tender, not distended. Extremity: no pedal edema PSYCH: alert & oriented x 3 with fluent speech NEURO: no focal motor/sensory deficits    LABORATORY DATA:  I have reviewed the data as listed  . CBC Latest Ref Rng & Units 05/05/2020 12/10/2019 08/11/2019  WBC 4.0 - 10.5 K/uL 8.4 6.9 7.4  Hemoglobin 13.0 - 17.0 g/dL 16.0 15.6 15.7  Hematocrit 39.0 - 52.0 % 48.8 46.7 47.3  Platelets 150 - 400 K/uL 238 226 237  ANC 300  . CMP Latest Ref Rng & Units 05/05/2020 12/10/2019 08/11/2019  Glucose 70 - 99 mg/dL 135(H) 145(H) 151(H)  BUN 6 - 20 mg/dL 18 17 11   Creatinine 0.61  - 1.24 mg/dL 0.82 0.85 0.80  Sodium 135 - 145 mmol/L 138 136 138  Potassium 3.5 - 5.1 mmol/L 4.2 4.5 4.6  Chloride 98 - 111 mmol/L 102 100 101  CO2 22 - 32 mmol/L 25 28 28   Calcium 8.9 - 10.3 mg/dL 9.2 10.1 9.5  Total Protein 6.5 - 8.1 g/dL 7.4 7.5 7.3  Total Bilirubin 0.3 - 1.2 mg/dL 0.5 0.8 0.8  Alkaline Phos 38 - 126 U/L 110 103 91  AST 15 - 41 U/L 16 16 17   ALT 0 - 44 U/L 22 17 26     Pathology:   11/25/17 Tissue Flow Cytometry:    Component     Latest Ref Rng & Units 05/05/2019  hCG Quant     0 - 3 mIU/mL <1  AFP, Serum, Tumor Marker     0.0 - 8.3 ng/mL 2.0  LDH     98 - 192 U/L 176     RADIOGRAPHIC STUDIES: I have personally reviewed the radiological images as listed and agreed with the findings in the report. No results found.  ASSESSMENT & PLAN:   60 y.o. male with  1. 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 Prostate enlargement.  11/17/17 PET/CT revealed Isolated hypermetabolic retroperitoneal lymphadenopathy. Findings suspicious for lymphoma as no primary lesion is identified to suggest this is metastatic adenopathy. Recommend retroperitoneal biopsy. The largest left-sided node appears necrotic and is actually decreased in size since the prior CT scan. It appeared inflamed on the prior study and may have infarcted. I would not recommend biopsying this lesion. No adenopathy in the neck, axilla, chest, pelvis or inguinal regions. No worrisome pulmonary lesions or osseous lesions.  11/25/17 pathology results revealed  finding of metastatic seminoma    12/04/17 US Scrotum w/doppler revealed Negative scrotal ultrasound with no distinct testicular mass Identified. 2. 5 mm simple left epididymal cyst, almost certainly benign and felt to be  incidental in nature.   S/p 4 cycles of EP treatment, tolerated well overall  04/09/18 CT C/A/P revealed "no metastatic adenopathy in the chest. Abdomen/Pelvis: 1. Interval reduction in volume of  LEFT periaortic metastatic lymph node. 2. No new adenopathy in the abdomen pelvis. 3. Benign-appearing splenic in LEFT renal cystic lesions."  11/26/2018 CT CAP revealed "1. Stable mild left abdominal retroperitoneal lymphadenopathy. 2. No new or progressive metastatic disease within the chest, abdomen, or pelvis. 3. Stable mildly enlarged prostate."  2. Chemotherapy related neutropenia -- resolved.  PLAN:  -Discussed pt labwork, 05/05/2020; blood counts and chemistries normal, LDH, HCG, AFP tumor marker neg -Advised pt he has passed the two year mark from completion of treatment (December 2019). -No lab, clinical evidence of extranodal seminoma progression/recurrence at this time.  -No indication for further treatment at this time. -Will see back in 26 weeks.  -Will get labs and CT chest/abd/pel in 24 weeks.    FOLLOW UP: Labs and CT chest/abd/pelvis in 24 weeks RTC with Dr Irene Limbo 26 weeks    The total time spent in the appointment was 20 minutes and more than 50% was on counseling and direct patient cares.  All of the patient's questions were answered with apparent satisfaction. The patient knows to call the clinic with any problems, questions or concerns.   Sullivan Lone MD [place] AAHIVMS [facility] Hematology/Oncology Physician [facility]  (Office):       [phone] (Work cell):  [phone] (Fax):           [phone]  05/04/2020 8:23 PM  I, Reinaldo Raddle, am acting as scribe for Dr. Sullivan Lone, MD.     .I have reviewed the above documentation for accuracy and completeness, and I agree with the above. Brunetta Genera MD

## 2020-05-05 ENCOUNTER — Inpatient Hospital Stay: Payer: BC Managed Care – PPO | Attending: Hematology

## 2020-05-05 ENCOUNTER — Other Ambulatory Visit: Payer: Self-pay

## 2020-05-05 ENCOUNTER — Inpatient Hospital Stay (HOSPITAL_BASED_OUTPATIENT_CLINIC_OR_DEPARTMENT_OTHER): Payer: BC Managed Care – PPO | Admitting: Hematology

## 2020-05-05 VITALS — BP 136/82 | HR 84 | Temp 97.0°F | Resp 20 | Ht 72.0 in | Wt 256.9 lb

## 2020-05-05 DIAGNOSIS — C779 Secondary and unspecified malignant neoplasm of lymph node, unspecified: Secondary | ICD-10-CM | POA: Diagnosis not present

## 2020-05-05 DIAGNOSIS — E669 Obesity, unspecified: Secondary | ICD-10-CM | POA: Diagnosis not present

## 2020-05-05 DIAGNOSIS — E119 Type 2 diabetes mellitus without complications: Secondary | ICD-10-CM | POA: Insufficient documentation

## 2020-05-05 DIAGNOSIS — N400 Enlarged prostate without lower urinary tract symptoms: Secondary | ICD-10-CM | POA: Diagnosis not present

## 2020-05-05 DIAGNOSIS — Z79899 Other long term (current) drug therapy: Secondary | ICD-10-CM | POA: Insufficient documentation

## 2020-05-05 DIAGNOSIS — C6290 Malignant neoplasm of unspecified testis, unspecified whether descended or undescended: Secondary | ICD-10-CM

## 2020-05-05 DIAGNOSIS — R590 Localized enlarged lymph nodes: Secondary | ICD-10-CM | POA: Insufficient documentation

## 2020-05-05 DIAGNOSIS — Z6834 Body mass index (BMI) 34.0-34.9, adult: Secondary | ICD-10-CM | POA: Diagnosis not present

## 2020-05-05 LAB — CMP (CANCER CENTER ONLY)
ALT: 22 U/L (ref 0–44)
AST: 16 U/L (ref 15–41)
Albumin: 4 g/dL (ref 3.5–5.0)
Alkaline Phosphatase: 110 U/L (ref 38–126)
Anion gap: 11 (ref 5–15)
BUN: 18 mg/dL (ref 6–20)
CO2: 25 mmol/L (ref 22–32)
Calcium: 9.2 mg/dL (ref 8.9–10.3)
Chloride: 102 mmol/L (ref 98–111)
Creatinine: 0.82 mg/dL (ref 0.61–1.24)
GFR, Estimated: >60 mL/min (ref >60)
Glucose, Bld: 135 mg/dL — ABNORMAL HIGH (ref 70–99)
Potassium: 4.2 mmol/L (ref 3.5–5.1)
Sodium: 138 mmol/L (ref 135–145)
Total Bilirubin: 0.5 mg/dL (ref 0.3–1.2)
Total Protein: 7.4 g/dL (ref 6.5–8.1)

## 2020-05-05 LAB — CBC WITH DIFFERENTIAL/PLATELET
Abs Immature Granulocytes: 0.03 10*3/uL (ref 0.00–0.07)
Basophils Absolute: 0 10*3/uL (ref 0.0–0.1)
Basophils Relative: 0 %
Eosinophils Absolute: 0.2 10*3/uL (ref 0.0–0.5)
Eosinophils Relative: 3 %
HCT: 48.8 % (ref 39.0–52.0)
Hemoglobin: 16 g/dL (ref 13.0–17.0)
Immature Granulocytes: 0 %
Lymphocytes Relative: 28 %
Lymphs Abs: 2.4 10*3/uL (ref 0.7–4.0)
MCH: 28.6 pg (ref 26.0–34.0)
MCHC: 32.8 g/dL (ref 30.0–36.0)
MCV: 87.1 fL (ref 80.0–100.0)
Monocytes Absolute: 0.7 10*3/uL (ref 0.1–1.0)
Monocytes Relative: 8 %
Neutro Abs: 5.1 10*3/uL (ref 1.7–7.7)
Neutrophils Relative %: 61 %
Platelets: 238 10*3/uL (ref 150–400)
RBC: 5.6 MIL/uL (ref 4.22–5.81)
RDW: 12.9 % (ref 11.5–15.5)
WBC: 8.4 10*3/uL (ref 4.0–10.5)
nRBC: 0 % (ref 0.0–0.2)

## 2020-05-05 LAB — LACTATE DEHYDROGENASE: LDH: 166 U/L (ref 98–192)

## 2020-05-06 LAB — AFP TUMOR MARKER: AFP, Serum, Tumor Marker: 1.9 ng/mL (ref 0.0–8.3)

## 2020-05-06 LAB — BETA HCG QUANT (REF LAB): hCG Quant: <1 m[IU]/mL (ref 0–3)

## 2020-08-25 IMAGING — MR MR PROSTATE WO/W CM
56 series · 56 of 56 positions shown · IV contrast (multihance)
Comparison: None.

CLINICAL DATA: Elevated PSA.

EXAM:
MR PROSTATE WITHOUT AND WITH CONTRAST
TECHNIQUE: Multiplanar multisequence MRI images were obtained of the pelvis
centered about the prostate. Pre and post contrast images were
obtained.
CONTRAST:  20mL MULTIHANCE GADOBENATE DIMEGLUMINE 529 MG/ML IV SOLN

[Series 3: bSSFP fat-sat · axial · 8.0mm · 0.74mm/px · 1 of 28 slices shown]
[im 1/28]
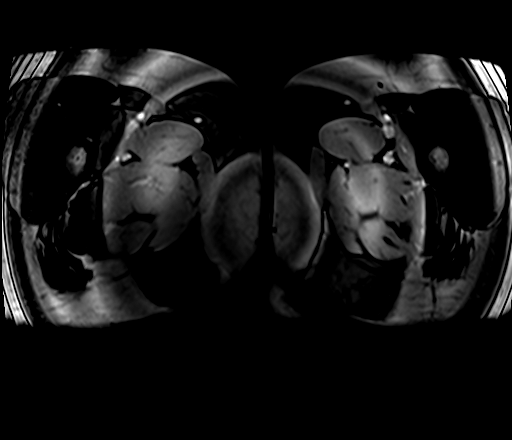

[Series 4: T1 · axial · 5.0mm · 1.25mm/px · 1 of 80 slices shown]
[im 1/80]
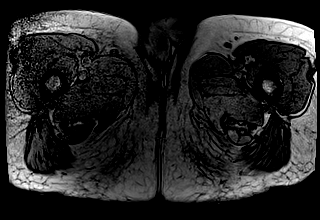

[Series 5: T2 · coronal · 3.5mm · 0.56mm/px · 1 of 23 slices shown (1 of 3)]
[im 1/23]
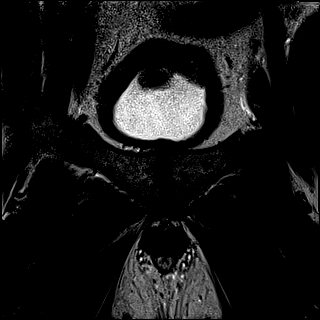

[Series 6: DWI · axial · 3.5mm · 1.75mm/px · 1 of 66 slices shown (1 of 3)]
[im 1/66]
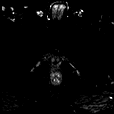

[Series 7: DWI · axial · 3.5mm · 1.75mm/px · 1 of 22 slices shown (2 of 3)]
[im 1/22]
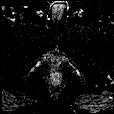

[Series 8: DWI · axial · 3.5mm · 1.56mm/px · 1 of 22 slices shown (3 of 3)]
[im 1/22]
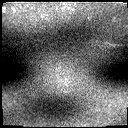

[Series 9: T2 · axial · 3.5mm · 0.56mm/px · 1 of 23 slices shown (2 of 3)]
[im 1/23]
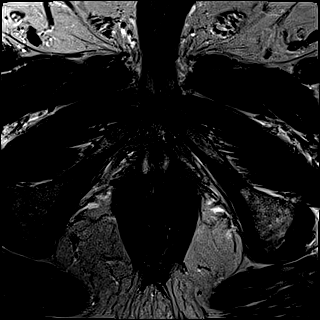

[Series 10: T2 · axial · 1.0mm · 1.04mm/px · 1 of 80 slices shown (3 of 3)]
[im 1/80]
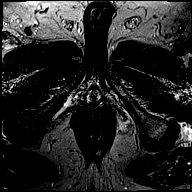

[Series 11: pre t1_twist_tra_dyn_ttc=5.8s · axial · non-contrast · 3.5mm · 0.83mm/px · 1 of 22 slices shown]
[im 1/22]
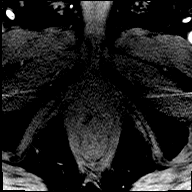

[Series 12: post t1_twist_tra_dyn-copy center · axial · 3.5mm · 0.83mm/px · 1 of 22 slices shown (1 of 24)]
[im 1/22]
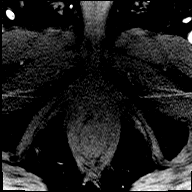

[Series 13: post t1_twist_tra_dyn-copy center · axial · 3.5mm · 0.83mm/px · 1 of 22 slices shown (2 of 24)]
[im 1/22]
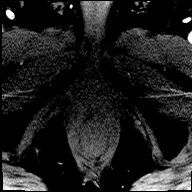

[Series 14: post t1_twist_tra_dyn-copy cent_sub_ttc=(id) · axial · 3.5mm · 0.83mm/px · 1 of 17 slices shown (1 of 23)]
[im 1/17]
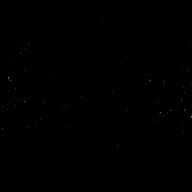

[Series 15: post t1_twist_tra_dyn-copy center · axial · 3.5mm · 0.83mm/px · 1 of 22 slices shown (3 of 24)]
[im 1/22]
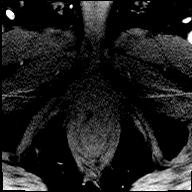

[Series 16: post t1_twist_tra_dyn-copy cent_sub_ttc=(id) · axial · 3.5mm · 0.83mm/px · 1 of 22 slices shown (2 of 23)]
[im 1/22]
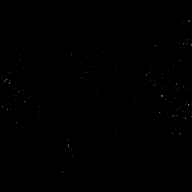

[Series 17: post t1_twist_tra_dyn-copy center · axial · 3.5mm · 0.83mm/px · 1 of 22 slices shown (4 of 24)]
[im 1/22]
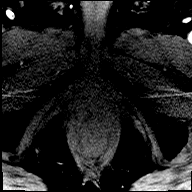

[Series 18: post t1_twist_tra_dyn-copy cent_sub_ttc=(id) · axial · 3.5mm · 0.83mm/px · 1 of 22 slices shown (3 of 23)]
[im 1/22]
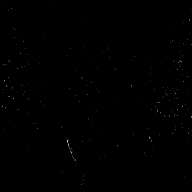

[Series 19: post t1_twist_tra_dyn-copy center · axial · 3.5mm · 0.83mm/px · 1 of 22 slices shown (5 of 24)]
[im 1/22]
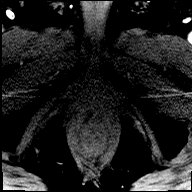

[Series 20: post t1_twist_tra_dyn-copy cent_sub_ttc=(id) · axial · 3.5mm · 0.83mm/px · 1 of 21 slices shown (4 of 23)]
[im 1/21]
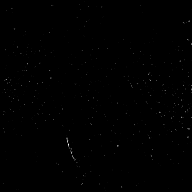

[Series 21: post t1_twist_tra_dyn-copy center · axial · 3.5mm · 0.83mm/px · 1 of 22 slices shown (6 of 24)]
[im 1/22]
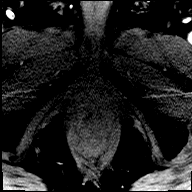

[Series 22: post t1_twist_tra_dyn-copy cent_sub_ttc=(id) · axial · 3.5mm · 0.83mm/px · 1 of 22 slices shown (5 of 23)]
[im 1/22]
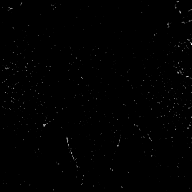

[Series 23: post t1_twist_tra_dyn-copy center · axial · 3.5mm · 0.83mm/px · 1 of 22 slices shown (7 of 24)]
[im 1/22]
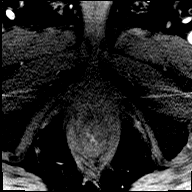

[Series 24: post t1_twist_tra_dyn-copy cent_sub_ttc=(id) · axial · 3.5mm · 0.83mm/px · 1 of 22 slices shown (6 of 23)]
[im 1/22]
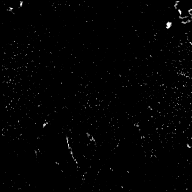

[Series 25: post t1_twist_tra_dyn-copy center · axial · 3.5mm · 0.83mm/px · 1 of 22 slices shown (8 of 24)]
[im 1/22]
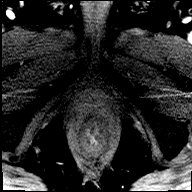

[Series 26: post t1_twist_tra_dyn-copy cent_sub_ttc=(id) · axial · 3.5mm · 0.83mm/px · 1 of 22 slices shown (7 of 23)]
[im 1/22]
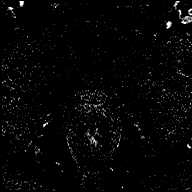

[Series 27: post t1_twist_tra_dyn-copy center · axial · 3.5mm · 0.83mm/px · 1 of 22 slices shown (9 of 24)]
[im 1/22]
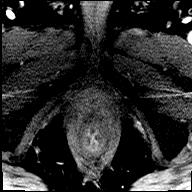

[Series 28: post t1_twist_tra_dyn-copy cent_sub_ttc=(id) · axial · 3.5mm · 0.83mm/px · 1 of 22 slices shown (8 of 23)]
[im 1/22]
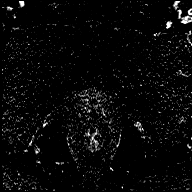

[Series 29: post t1_twist_tra_dyn-copy center · axial · 3.5mm · 0.83mm/px · 1 of 22 slices shown (10 of 24)]
[im 1/22]
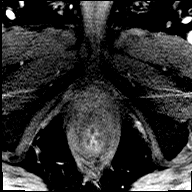

[Series 30: post t1_twist_tra_dyn-copy cent_sub_ttc=(id) · axial · 3.5mm · 0.83mm/px · 1 of 22 slices shown (9 of 23)]
[im 1/22]
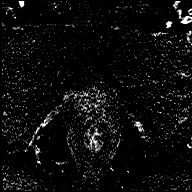

[Series 31: post t1_twist_tra_dyn-copy center · axial · 3.5mm · 0.83mm/px · 1 of 22 slices shown (11 of 24)]
[im 1/22]
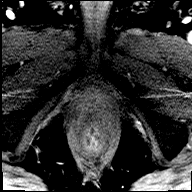

[Series 32: post t1_twist_tra_dyn-copy cent_sub_ttc=(id) · axial · 3.5mm · 0.83mm/px · 1 of 22 slices shown (10 of 23)]
[im 1/22]
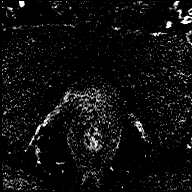

[Series 33: post t1_twist_tra_dyn-copy center · axial · 3.5mm · 0.83mm/px · 1 of 22 slices shown (12 of 24)]
[im 1/22]
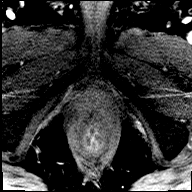

[Series 34: post t1_twist_tra_dyn-copy cent_sub_ttc=(id) · axial · 3.5mm · 0.83mm/px · 1 of 22 slices shown (11 of 23)]
[im 1/22]
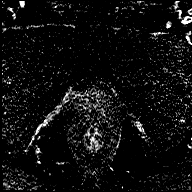

[Series 35: post t1_twist_tra_dyn-copy center · axial · 3.5mm · 0.83mm/px · 1 of 22 slices shown (13 of 24)]
[im 1/22]
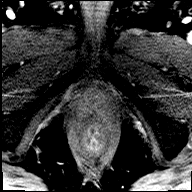

[Series 36: post t1_twist_tra_dyn-copy cent_sub_ttc=(id) · axial · 3.5mm · 0.83mm/px · 1 of 22 slices shown (12 of 23)]
[im 1/22]
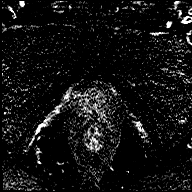

[Series 37: post t1_twist_tra_dyn-copy center · axial · 3.5mm · 0.83mm/px · 1 of 22 slices shown (14 of 24)]
[im 1/22]
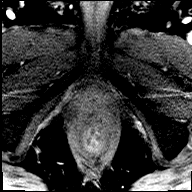

[Series 38: post t1_twist_tra_dyn-copy cent_sub_ttc=(id) · axial · 3.5mm · 0.83mm/px · 1 of 22 slices shown (13 of 23)]
[im 1/22]
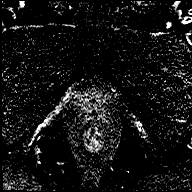

[Series 39: post t1_twist_tra_dyn-copy center · axial · 3.5mm · 0.83mm/px · 1 of 22 slices shown (15 of 24)]
[im 1/22]
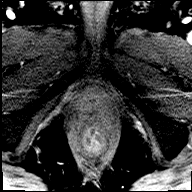

[Series 40: post t1_twist_tra_dyn-copy cent_sub_ttc=(id) · axial · 3.5mm · 0.83mm/px · 1 of 22 slices shown (14 of 23)]
[im 1/22]
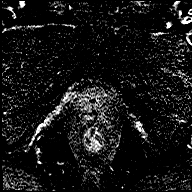

[Series 41: post t1_twist_tra_dyn-copy center · axial · 3.5mm · 0.83mm/px · 1 of 22 slices shown (16 of 24)]
[im 1/22]
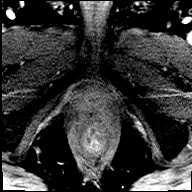

[Series 42: post t1_twist_tra_dyn-copy cent_sub_ttc=(id) · axial · 3.5mm · 0.83mm/px · 1 of 22 slices shown (15 of 23)]
[im 1/22]
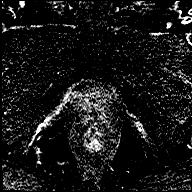

[Series 43: post t1_twist_tra_dyn-copy center · axial · 3.5mm · 0.83mm/px · 1 of 22 slices shown (17 of 24)]
[im 1/22]
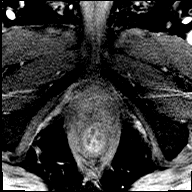

[Series 44: post t1_twist_tra_dyn-copy cent_sub_ttc=(id) · axial · 3.5mm · 0.83mm/px · 1 of 22 slices shown (16 of 23)]
[im 1/22]
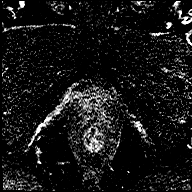

[Series 45: post t1_twist_tra_dyn-copy center · axial · 3.5mm · 0.83mm/px · 1 of 22 slices shown (18 of 24)]
[im 1/22]
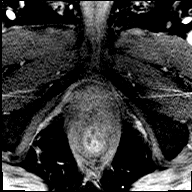

[Series 46: post t1_twist_tra_dyn-copy cent_sub_ttc=(id) · axial · 3.5mm · 0.83mm/px · 1 of 22 slices shown (17 of 23)]
[im 1/22]
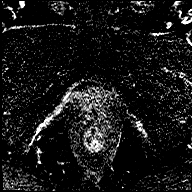

[Series 47: post t1_twist_tra_dyn-copy center · axial · 3.5mm · 0.83mm/px · 1 of 22 slices shown (19 of 24)]
[im 1/22]
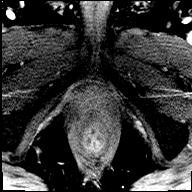

[Series 48: post t1_twist_tra_dyn-copy cent_sub_ttc=(id) · axial · 3.5mm · 0.83mm/px · 1 of 22 slices shown (18 of 23)]
[im 1/22]
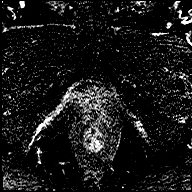

[Series 49: post t1_twist_tra_dyn-copy center · axial · 3.5mm · 0.83mm/px · 1 of 22 slices shown (20 of 24)]
[im 1/22]
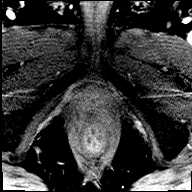

[Series 50: post t1_twist_tra_dyn-copy cent_sub_ttc=(id) · axial · 3.5mm · 0.83mm/px · 1 of 22 slices shown (19 of 23)]
[im 1/22]
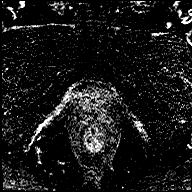

[Series 51: post t1_twist_tra_dyn-copy center · axial · 3.5mm · 0.83mm/px · 1 of 22 slices shown (21 of 24)]
[im 1/22]
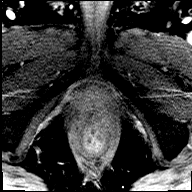

[Series 52: post t1_twist_tra_dyn-copy cent_sub_ttc=(id) · axial · 3.5mm · 0.83mm/px · 1 of 22 slices shown (20 of 23)]
[im 1/22]
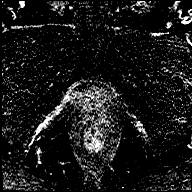

[Series 53: post t1_twist_tra_dyn-copy center · axial · 3.5mm · 0.83mm/px · 1 of 22 slices shown (22 of 24)]
[im 1/22]
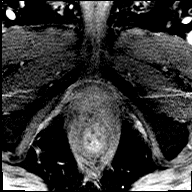

[Series 54: post t1_twist_tra_dyn-copy cent_sub_ttc=(id) · axial · 3.5mm · 0.83mm/px · 1 of 22 slices shown (21 of 23)]
[im 1/22]
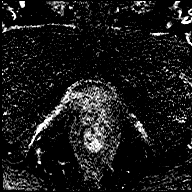

[Series 55: post t1_twist_tra_dyn-copy center · axial · 3.5mm · 0.83mm/px · 1 of 22 slices shown (23 of 24)]
[im 1/22]
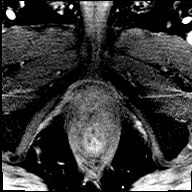

[Series 56: post t1_twist_tra_dyn-copy cent_sub_ttc=(id) · axial · 3.5mm · 0.83mm/px · 1 of 22 slices shown (22 of 23)]
[im 1/22]
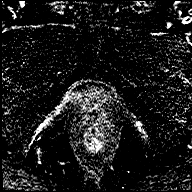

[Series 57: post t1_twist_tra_dyn-copy center · axial · 3.5mm · 0.83mm/px · 1 of 22 slices shown (24 of 24)]
[im 1/22]
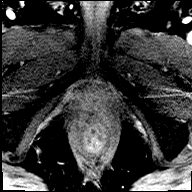

[Series 58: post t1_twist_tra_dyn-copy cent_sub_ttc=(id) · axial · 3.5mm · 0.83mm/px · 1 of 22 slices shown (23 of 23)]
[im 1/22]
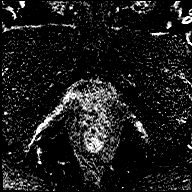

[56 of 56 positions shown; findings below may reference images not displayed]

FINDINGS: Prostate:

-- Peripheral Zone: No abnormality seen on ADC and high b-value DWI
sequences. Tiny prostatic utricle cyst noted in the midline.

-- Transition/Central Zone: Circumscribed BPH nodules are noted, but
no suspicious nodules with obscured or non-circumscribed margins
seen.

-- Measurements/Volume:  5.4 x 5.1 x 6.3 cm (volume = 91 cm^3)

Transcapsular spread:  Absent

Seminal vesicle involvement:  Absent

Neurovascular bundle involvement:  Absent

Pelvic adenopathy: None visualized

Bone metastasis: None visualized

Other:  None
IMPRESSION: No radiographic evidence of high-grade prostate carcinoma. PI-RADS
1: Very Low (clinically significant cancer is highly unlikely to be
present).

## 2020-10-20 ENCOUNTER — Other Ambulatory Visit: Payer: Self-pay

## 2020-10-20 ENCOUNTER — Encounter (HOSPITAL_COMMUNITY): Payer: Self-pay

## 2020-10-20 ENCOUNTER — Inpatient Hospital Stay: Payer: PRIVATE HEALTH INSURANCE | Attending: Hematology

## 2020-10-20 ENCOUNTER — Ambulatory Visit (HOSPITAL_COMMUNITY)
Admission: RE | Admit: 2020-10-20 | Discharge: 2020-10-20 | Disposition: A | Discharge status: Home / Self Care | Payer: PRIVATE HEALTH INSURANCE | Source: Ambulatory Visit | Attending: Hematology | Admitting: Hematology

## 2020-10-20 DIAGNOSIS — C6290 Malignant neoplasm of unspecified testis, unspecified whether descended or undescended: Secondary | ICD-10-CM

## 2020-10-20 LAB — CBC WITH DIFFERENTIAL/PLATELET
Abs Immature Granulocytes: 0.02 10*3/uL (ref 0.00–0.07)
Basophils Absolute: 0 10*3/uL (ref 0.0–0.1)
Basophils Relative: 0 %
Eosinophils Absolute: 0.2 10*3/uL (ref 0.0–0.5)
Eosinophils Relative: 2 %
HCT: 44.6 % (ref 39.0–52.0)
Hemoglobin: 15 g/dL (ref 13.0–17.0)
Immature Granulocytes: 0 %
Lymphocytes Relative: 22 %
Lymphs Abs: 1.9 10*3/uL (ref 0.7–4.0)
MCH: 29.1 pg (ref 26.0–34.0)
MCHC: 33.6 g/dL (ref 30.0–36.0)
MCV: 86.4 fL (ref 80.0–100.0)
Monocytes Absolute: 0.7 10*3/uL (ref 0.1–1.0)
Monocytes Relative: 8 %
Neutro Abs: 5.7 10*3/uL (ref 1.7–7.7)
Neutrophils Relative %: 68 %
Platelets: 257 10*3/uL (ref 150–400)
RBC: 5.16 MIL/uL (ref 4.22–5.81)
RDW: 12.8 % (ref 11.5–15.5)
WBC: 8.5 10*3/uL (ref 4.0–10.5)
nRBC: 0 % (ref 0.0–0.2)

## 2020-10-20 LAB — CMP (CANCER CENTER ONLY)
ALT: 20 U/L (ref 0–44)
AST: 18 U/L (ref 15–41)
Albumin: 4.1 g/dL (ref 3.5–5.0)
Alkaline Phosphatase: 101 U/L (ref 38–126)
Anion gap: 10 (ref 5–15)
BUN: 19 mg/dL (ref 6–20)
CO2: 26 mmol/L (ref 22–32)
Calcium: 9.1 mg/dL (ref 8.9–10.3)
Chloride: 102 mmol/L (ref 98–111)
Creatinine: 0.92 mg/dL (ref 0.61–1.24)
GFR, Estimated: >60 mL/min (ref >60)
Glucose, Bld: 120 mg/dL — ABNORMAL HIGH (ref 70–99)
Potassium: 4.3 mmol/L (ref 3.5–5.1)
Sodium: 138 mmol/L (ref 135–145)
Total Bilirubin: 0.4 mg/dL (ref 0.3–1.2)
Total Protein: 7.1 g/dL (ref 6.5–8.1)

## 2020-10-20 LAB — LACTATE DEHYDROGENASE: LDH: 155 U/L (ref 98–192)

## 2020-10-20 MED ORDER — SODIUM CHLORIDE (PF) 0.9 % IJ SOLN
INTRAMUSCULAR | Status: AC
  Filled 2020-10-20: qty 50

## 2020-10-20 MED ORDER — IOHEXOL 300 MG/ML  SOLN
100.0000 mL | Freq: Once | INTRAMUSCULAR | Status: AC | PRN
  Administered 2020-10-20: 100 mL via INTRAVENOUS

## 2020-10-21 LAB — AFP TUMOR MARKER: AFP, Serum, Tumor Marker: 1.9 ng/mL (ref 0.0–8.4)

## 2020-10-21 LAB — BETA HCG QUANT (REF LAB): hCG Quant: <1 m[IU]/mL (ref 0–3)

## 2020-11-02 NOTE — Progress Notes (Signed)
HEMATOLOGY/ONCOLOGY CLNIC NOTE  Date of Service: 11/02/2020  Patient Care Team: Antony Contras, MD as PCP - General (Family Medicine)  CHIEF COMPLAINTS/PURPOSE OF CONSULTATION:  F/u for extragondal seminoma  HISTORY OF PRESENTING ILLNESS:   Neil Berg is a wonderful 60 y.o. male who has been referred to [place] by Dr. Antony Contras for evaluation and management of Lymphadenopathy. He is accompanied today by his wife. The pt reports that he is doing well overall and [place]   The pt reports that he hasn't had any pain in over a week and recently appeared to [facility] on 10/17/17. He notes that he woke up on the morning of 10/17/17 with significant, unique, back and kidney pain which began to spread around to the RUQ of his abdomen as the morning progressed. He notes that his pain went away after taking pain medications for 5 days, and describes that the pain was waxing and waning. He also endorses some positional exacerbation to his pain. He denies any nausea, vomiting, diarrhea, changes in bowel habits, testicular pain or swelling, difficulty/changes in urination at the time or since then.    The pt also notes that  In the week of these symptoms he felt very cold, which is different for him as he describes himself as very warm natured. He also notes that his appetite weakened and he lost about 10 pounds, and was abiding by the [employer]. He notes that his appetite has returned and he is eating normally again. He denies any pain associated with meals. He denies recent exotic or international travel  He takes Jardiance for his DM. He notes that his PSA spiked two years ago, had a biopsy, and his PSA counts have decreased recently and is believed to be BPH.    He also notes that he had a recurring rash on his calves, worse on the left, that was red and flat, mimicking his sock impression, but extending further up his leg. He notes that this happened over 3  months, and that it came and went without any discernable pattern. He denies any associated itching.   He also notes a place on the front of his left lower leg that has been slightly swollen for the past a week. He denies remembering running into anything, bruising, or pain.   Of note prior to the patient's visit today, pt has had CT A/P completed on 10/14/17 with results revealing Retroperitoneal lymphadenopathy, highly suspicious for lymphoma or metastatic disease. 2. Prostate enlargement 3.  Aortic Atherosclerosis.   Most recent lab results (10/17/17) of CBC w/diff is as follows: all values are WNL except for WBC at 13.9k, RBC at 5.83, ANC at 10.0k, Monocytes abs at 1.4k.  On review of systems, pt reports recent back pain, feeling cold recently, recent abdominal pain, and denies nausea, vomiting, diarrhea, changes in bowel habits, difficulty/changes in urination, blood in the urine, abdominal trauma, constipation, fevers, night sweats, chills, testicular pain or swelling, noticing any lumps or bumps, unexpected weight loss, and any other symptoms.   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  On Social Hx the pt reports working in [employer] and denies chemical/radiaiton exposure. He denies ever smoking and other drug use.  Interval History:   Neil Berg returns today for followup of Stage II extra-gonadal retroperitoneal Seminoma. The patient's  last visit with [place] was on 05/05/2020. The pt reports that he is doing well overall.  The pt reports no abdominal or bad pain. No other new focal symptoms. No headaches. No chest pain or shortness of breath.  Of note since the patient's last visit, pt has had CT CAP ([id number]) on 10/20/2020, which revealed "1. Stable abdominal retroperitoneal adenopathy. 2. New 1 mm posterior right lower lobe nodule, nonspecific but  new from 12/10/2019. Recommend attention on follow-up. 3. Enlarged prostate. 4. Aortic atherosclerosis (ICD10-I70.0). Coronary artery Calcification.  Lab results 10/20/2020 of CBC w/diff and CMP is as follows: all values are WNL except for Glucose of 120. 10/20/2020 hCG Quant of <1. 10/20/2020 AFP Tumor Marker of 1.9. 10/20/2020 LDH of 155.  On review of systems, pt reports no other acute symptoms.    MEDICAL HISTORY:  Past Medical History:  Diagnosis Date   Chest pain    Diabetes mellitus without complication ([place])    Hypertension    Obesity   Type II Diabetes Mellitus   SURGICAL HISTORY: Past Surgical History:  Procedure Laterality Date   EYE SURGERY     left eye cataract removal / implant    IR IMAGING GUIDED PORT INSERTION  12/11/2017   IR REMOVAL TUN ACCESS W/ PORT W/O FL MOD SED  05/01/2018   TONSILLECTOMY      Tonsillectomy 1974 Cataract surgery-left eye 04/2015 Colonoscopy 06/04/16  SOCIAL HISTORY: Social History   Socioeconomic History   Marital status: Married    Spouse name: Not on file   Number of children: Not on file   Years of education: Not on file   Highest education level: Not on file  Occupational History   Not on file  Tobacco Use   Smoking status: Never   Smokeless tobacco: Never  Vaping Use   Vaping Use: Never used  Substance and Sexual Activity   Alcohol use: No   Drug use: No   Sexual activity: Not on file  Other Topics Concern   Not on file  Social History Narrative   Not on file   Social Determinants of Health   Financial Resource Strain: Not on file  Food Insecurity: Not on file  Transportation Needs: Not on file  Physical Activity: Not on file  Stress: Not on file  Social Connections: Not on file  Intimate Partner Violence: Not on file    FAMILY HISTORY: Family History  Problem Relation Age of Onset   Heart disease Maternal Grandmother    Heart attack Maternal Grandfather     ALLERGIES:  has No Known  Allergies.  MEDICATIONS:  Current Outpatient Medications  Medication Sig Dispense Refill   amLODipine (NORVASC) 5 MG tablet Take 1 tablet (5 mg total) by mouth daily. 30 tablet 11   aspirin EC 81 MG tablet Take 81 mg by mouth daily.     atorvastatin (LIPITOR) 40 MG tablet Take 40 mg by mouth daily.     b complex vitamins tablet Take 1 tablet by mouth daily.     JARDIANCE 25 MG TABS tablet TK 1 T PO QAM  0   lisinopril (PRINIVIL,ZESTRIL) 20 MG tablet TAKE 1 TABLET BY MOUTH EVERY DAY. DISCONTINUE LISINOPRIL/HCTZ COMBINATION 30 tablet 0   No current facility-administered medications for this visit.    REVIEW OF SYSTEMS:   10 Point review of Systems was done is negative except as noted above.  PHYSICAL EXAMINATION: ECOG PERFORMANCE STATUS: 1 - Symptomatic but completely ambulatory  .There were no vitals taken  for this visit.  NAD GENERAL:alert, in no acute distress and comfortable SKIN: no acute rashes, no significant lesions EYES: conjunctiva are pink and non-injected, sclera anicteric OROPHARYNX: MMM, no exudates, no oropharyngeal erythema or ulceration NECK: supple, no JVD LYMPH:  no palpable lymphadenopathy in the cervical, axillary or inguinal regions LUNGS: clear to auscultation b/l with normal respiratory effort HEART: regular rate & rhythm ABDOMEN:  normoactive bowel sounds , non tender, not distended. Extremity: no pedal edema PSYCH: alert & oriented x 3 with fluent speech NEURO: no focal motor/sensory deficits  LABORATORY DATA:  I have reviewed the data as listed  . CBC Latest Ref Rng & Units 10/20/2020 05/05/2020 12/10/2019  WBC 4.0 - 10.5 K/uL 8.5 8.4 6.9  Hemoglobin 13.0 - 17.0 g/dL 15.0 16.0 15.6  Hematocrit 39.0 - 52.0 % 44.6 48.8 46.7  Platelets 150 - 400 K/uL 257 238 226  ANC 300  . CMP Latest Ref Rng & Units 10/20/2020 05/05/2020 12/10/2019  Glucose 70 - 99 mg/dL 120(H) 135(H) 145(H)  BUN 6 - 20 mg/dL '19 18 17  '$ Creatinine 0.61 - 1.24 mg/dL 0.92 0.82 0.85   Sodium 135 - 145 mmol/L 138 138 136  Potassium 3.5 - 5.1 mmol/L 4.3 4.2 4.5  Chloride 98 - 111 mmol/L 102 102 100  CO2 22 - 32 mmol/L '26 25 28  '$ Calcium 8.9 - 10.3 mg/dL 9.1 9.2 10.1  Total Protein 6.5 - 8.1 g/dL 7.1 7.4 7.5  Total Bilirubin 0.3 - 1.2 mg/dL 0.4 0.5 0.8  Alkaline Phos 38 - 126 U/L 101 110 103  AST 15 - 41 U/L '18 16 16  '$ ALT 0 - 44 U/L '20 22 17   '$ Component     Latest Ref Rng & Units 10/20/2020  hCG Quant     0 - 3 mIU/mL <1  AFP, Serum, Tumor Marker     0.0 - 8.4 ng/mL 1.9  LDH     98 - 192 U/L 155    Pathology:   11/25/17 Tissue Flow Cytometry:       RADIOGRAPHIC STUDIES: I have personally reviewed the radiological images as listed and agreed with the findings in the report. CT CHEST ABDOMEN PELVIS W CONTRAST  Result Date: 10/21/2020 CLINICAL DATA:  Testicular cancer. EXAM: CT CHEST, ABDOMEN, AND PELVIS WITH CONTRAST TECHNIQUE: Multidetector CT imaging of the chest, abdomen and pelvis was performed following the standard protocol during bolus administration of intravenous contrast. CONTRAST:  173m OMNIPAQUE IOHEXOL 300 MG/ML  SOLN COMPARISON:  12/10/2019. FINDINGS: CT CHEST FINDINGS Cardiovascular: Atherosclerotic calcification of the aorta and coronary arteries. Heart size normal. No pericardial effusion. Mediastinum/Nodes: No pathologically enlarged mediastinal, hilar or axillary lymph nodes. Esophagus is grossly unremarkable. Lungs/Pleura: 1 mm posterior right lower lobe nodule (6/127), not definitely seen on the prior exam. Lungs are otherwise clear. No pleural fluid. Airway is unremarkable. Musculoskeletal: Degenerative changes in the spine. No worrisome lytic or sclerotic lesions. CT ABDOMEN PELVIS FINDINGS Hepatobiliary: Millimetric low-attenuation lesion in the right hepatic lobe is too small to characterize. Liver and gallbladder are otherwise unremarkable. No biliary ductal dilatation. Pancreas: Negative. Spleen: Negative. Adrenals/Urinary Tract: Right adrenal  gland is unremarkable. Slight thickening of the lateral limb left adrenal gland. Low-attenuation lesions in the kidneys measure up to 2.3 cm off the upper pole left kidney, similar and likely cysts. Kidneys are otherwise unremarkable. Ureters are decompressed. Bladder is indented by the prostate. Stomach/Bowel: Stomach, small bowel, appendix and colon are unremarkable. Vascular/Lymphatic: Atherosclerotic calcification of the aorta. Retroperitoneal lymph nodes  measure up to 1.5 cm in the left periaortic station (2/78), stable from 12/10/2019. No pelvic retroperitoneal adenopathy. Reproductive: Prostate is enlarged. Other: No free fluid. Mesenteries and peritoneum are unremarkable. Left hemidiaphragm is elevated. Musculoskeletal: Degenerative changes in the spine. Minimal retrolisthesis of L5 on S1. IMPRESSION: 1. Stable abdominal retroperitoneal adenopathy. 2. New 1 mm posterior right lower lobe nodule, nonspecific but new from 12/10/2019. Recommend attention on follow-up. 3. Enlarged prostate. 4. Aortic atherosclerosis (ICD10-I70.0). Coronary artery calcification. Electronically Signed   By: Lorin Picket M.D.   On: 10/21/2020 14:00    ASSESSMENT & PLAN:   60 y.o. male with  1. 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 Prostate enlargement.  11/17/17 PET/CT revealed Isolated hypermetabolic retroperitoneal lymphadenopathy. Findings suspicious for lymphoma as no primary lesion is identified to suggest this is metastatic adenopathy. Recommend retroperitoneal biopsy. The largest left-sided node appears necrotic and is actually decreased in size since the prior CT scan. It appeared inflamed on the prior study and may have infarcted. I would not recommend biopsying this lesion. No  adenopathy in the neck, axilla, chest, pelvis or inguinal regions. No worrisome pulmonary lesions or osseous lesions.  11/25/17 pathology results revealed  finding of metastatic seminoma    12/04/17 US Scrotum w/doppler revealed Negative scrotal ultrasound with no distinct testicular mass Identified. 2. 5 mm simple left epididymal cyst, almost certainly benign and felt to be incidental in nature.   S/p 4 cycles of EP treatment, tolerated well overall  04/09/18 CT C/A/P revealed "no metastatic adenopathy in the chest. Abdomen/Pelvis: 1. Interval reduction in volume of LEFT periaortic metastatic lymph node. 2. No new adenopathy in the abdomen pelvis. 3. Benign-appearing splenic in LEFT renal cystic lesions."  11/26/2018 CT CAP revealed "1. Stable mild left abdominal retroperitoneal lymphadenopathy. 2. No new or progressive metastatic disease within the chest, abdomen, or pelvis. 3. Stable mildly enlarged prostate."  2. Chemotherapy related neutropenia -- resolved.  PLAN:  -Discussed pt labwork, labs stable.  -tumor markers - wnl -Discussed pt CT chest /abd/pelvis - no overt evidence of progression of extragondal seminoma at this time. He does has an indeterminate 78m RLL lung nodule which will need to be monitored. -No lab, clinical or radiographic evidence of extranodal seminoma progression/recurrence at this time.  -No indication for further treatment at this time.   FOLLOW UP: Check-out note: Labs and CT chest in 24 weeks RTC with Dr KIrene Limbo26 weeks      The total time spent in the appointment was 25 minutes and more than 50% was on counseling and direct patient cares.  All of the patient's questions were answered with apparent satisfaction. The patient knows to call the clinic with any problems, questions or concerns.   GSullivan LoneMD MGuindaAAHIVMS SSilver Oaks Behavorial HospitalCSayre Memorial HospitalHematology/Oncology Physician CWellbrook [facility] Pc (Office):       [phone](Work cell):  [phone](Fax):            [phone] 11/02/2020 9:04 PM  I, RReinaldo Raddle am acting as scribe for Dr. GSullivan Lone MD.    .I have reviewed the above documentation for accuracy and completeness, and I agree with the above. .Brunetta GeneraMD

## 2020-11-03 ENCOUNTER — Inpatient Hospital Stay (HOSPITAL_BASED_OUTPATIENT_CLINIC_OR_DEPARTMENT_OTHER): Payer: PRIVATE HEALTH INSURANCE | Admitting: Hematology

## 2020-11-03 ENCOUNTER — Other Ambulatory Visit: Payer: Self-pay

## 2020-11-03 VITALS — BP 146/73 | HR 82 | Temp 97.8°F | Resp 18 | Wt 270.2 lb

## 2020-11-03 DIAGNOSIS — Z79899 Other long term (current) drug therapy: Secondary | ICD-10-CM | POA: Insufficient documentation

## 2020-11-03 DIAGNOSIS — C6290 Malignant neoplasm of unspecified testis, unspecified whether descended or undescended: Secondary | ICD-10-CM | POA: Diagnosis present

## 2020-11-03 DIAGNOSIS — R918 Other nonspecific abnormal finding of lung field: Secondary | ICD-10-CM

## 2020-11-03 DIAGNOSIS — R7402 Elevation of levels of lactic acid dehydrogenase (LDH): Secondary | ICD-10-CM | POA: Insufficient documentation

## 2020-11-03 DIAGNOSIS — R590 Localized enlarged lymph nodes: Secondary | ICD-10-CM | POA: Diagnosis not present

## 2020-11-06 ENCOUNTER — Telehealth: Payer: Self-pay | Admitting: Hematology

## 2020-11-06 NOTE — Telephone Encounter (Signed)
Scheduled follow-up appointments per 7/22 los. Patient is aware.

## 2021-03-10 ENCOUNTER — Encounter: Payer: Self-pay | Admitting: Hematology

## 2021-04-13 ENCOUNTER — Inpatient Hospital Stay: Payer: PRIVATE HEALTH INSURANCE | Attending: Hematology

## 2021-04-13 ENCOUNTER — Other Ambulatory Visit: Payer: Self-pay

## 2021-04-13 ENCOUNTER — Ambulatory Visit (HOSPITAL_COMMUNITY)
Admission: RE | Admit: 2021-04-13 | Discharge: 2021-04-13 | Disposition: A | Discharge status: Home / Self Care | Payer: PRIVATE HEALTH INSURANCE | Source: Ambulatory Visit | Attending: Hematology | Admitting: Hematology

## 2021-04-13 DIAGNOSIS — R918 Other nonspecific abnormal finding of lung field: Secondary | ICD-10-CM | POA: Insufficient documentation

## 2021-04-13 DIAGNOSIS — C6290 Malignant neoplasm of unspecified testis, unspecified whether descended or undescended: Secondary | ICD-10-CM | POA: Insufficient documentation

## 2021-04-13 DIAGNOSIS — Z79899 Other long term (current) drug therapy: Secondary | ICD-10-CM | POA: Diagnosis not present

## 2021-04-13 LAB — CBC WITH DIFFERENTIAL/PLATELET
Abs Immature Granulocytes: 0.03 10*3/uL (ref 0.00–0.07)
Basophils Absolute: 0 10*3/uL (ref 0.0–0.1)
Basophils Relative: 1 %
Eosinophils Absolute: 0.1 10*3/uL (ref 0.0–0.5)
Eosinophils Relative: 1 %
HCT: 48.2 % (ref 39.0–52.0)
Hemoglobin: 16.1 g/dL (ref 13.0–17.0)
Immature Granulocytes: 0 %
Lymphocytes Relative: 15 %
Lymphs Abs: 1 10*3/uL (ref 0.7–4.0)
MCH: 28.3 pg (ref 26.0–34.0)
MCHC: 33.4 g/dL (ref 30.0–36.0)
MCV: 84.9 fL (ref 80.0–100.0)
Monocytes Absolute: 0.8 10*3/uL (ref 0.1–1.0)
Monocytes Relative: 11 %
Neutro Abs: 4.8 10*3/uL (ref 1.7–7.7)
Neutrophils Relative %: 72 %
Platelets: 231 10*3/uL (ref 150–400)
RBC: 5.68 MIL/uL (ref 4.22–5.81)
RDW: 13.2 % (ref 11.5–15.5)
WBC: 6.8 10*3/uL (ref 4.0–10.5)
nRBC: 0 % (ref 0.0–0.2)

## 2021-04-13 LAB — CMP (CANCER CENTER ONLY)
ALT: 33 U/L (ref 0–44)
AST: 25 U/L (ref 15–41)
Albumin: 4.4 g/dL (ref 3.5–5.0)
Alkaline Phosphatase: 98 U/L (ref 38–126)
Anion gap: 9 (ref 5–15)
BUN: 12 mg/dL (ref 6–20)
CO2: 29 mmol/L (ref 22–32)
Calcium: 9.4 mg/dL (ref 8.9–10.3)
Chloride: 98 mmol/L (ref 98–111)
Creatinine: 0.79 mg/dL (ref 0.61–1.24)
GFR, Estimated: >60 mL/min (ref >60)
Glucose, Bld: 117 mg/dL — ABNORMAL HIGH (ref 70–99)
Potassium: 3.8 mmol/L (ref 3.5–5.1)
Sodium: 136 mmol/L (ref 135–145)
Total Bilirubin: 0.3 mg/dL (ref 0.3–1.2)
Total Protein: 7.7 g/dL (ref 6.5–8.1)

## 2021-04-13 LAB — LACTATE DEHYDROGENASE: LDH: 150 U/L (ref 98–192)

## 2021-04-14 LAB — AFP TUMOR MARKER: AFP, Serum, Tumor Marker: <1.8 ng/mL (ref 0.0–8.4)

## 2021-04-14 LAB — BETA HCG QUANT (REF LAB): hCG Quant: <1 m[IU]/mL (ref 0–3)

## 2021-04-27 ENCOUNTER — Inpatient Hospital Stay: Payer: PRIVATE HEALTH INSURANCE | Attending: Hematology | Admitting: Hematology

## 2021-04-27 ENCOUNTER — Other Ambulatory Visit: Payer: Self-pay

## 2021-04-27 VITALS — BP 127/77 | HR 92 | Temp 97.9°F | Resp 20 | Wt 265.7 lb

## 2021-04-27 DIAGNOSIS — R911 Solitary pulmonary nodule: Secondary | ICD-10-CM | POA: Diagnosis not present

## 2021-04-27 DIAGNOSIS — C6290 Malignant neoplasm of unspecified testis, unspecified whether descended or undescended: Secondary | ICD-10-CM | POA: Diagnosis present

## 2021-04-27 DIAGNOSIS — Z9221 Personal history of antineoplastic chemotherapy: Secondary | ICD-10-CM | POA: Insufficient documentation

## 2021-04-30 ENCOUNTER — Telehealth: Payer: PRIVATE HEALTH INSURANCE | Admitting: Nurse Practitioner

## 2021-04-30 DIAGNOSIS — Z6839 Body mass index (BMI) 39.0-39.9, adult: Secondary | ICD-10-CM | POA: Insufficient documentation

## 2021-04-30 DIAGNOSIS — E119 Type 2 diabetes mellitus without complications: Secondary | ICD-10-CM | POA: Insufficient documentation

## 2021-04-30 DIAGNOSIS — J40 Bronchitis, not specified as acute or chronic: Secondary | ICD-10-CM

## 2021-04-30 DIAGNOSIS — I10 Essential (primary) hypertension: Secondary | ICD-10-CM | POA: Insufficient documentation

## 2021-04-30 DIAGNOSIS — E782 Mixed hyperlipidemia: Secondary | ICD-10-CM | POA: Insufficient documentation

## 2021-04-30 MED ORDER — BENZONATATE 100 MG PO CAPS: 100.0000 mg | ORAL_CAPSULE | Freq: Three times a day (TID) | ORAL | 0 refills | Status: AC | PRN

## 2021-04-30 MED ORDER — PREDNISONE 10 MG PO TABS: 10.0000 mg | ORAL_TABLET | Freq: Two times a day (BID) | ORAL | 0 refills | Status: AC

## 2021-04-30 MED ORDER — DOXYCYCLINE HYCLATE 100 MG PO TABS: 100.0000 mg | ORAL_TABLET | Freq: Two times a day (BID) | ORAL | 0 refills | Status: AC

## 2021-04-30 NOTE — Progress Notes (Signed)
We are sorry that you are not feeling well.  Here is how we plan to help!  Based on your presentation I believe you most likely have A cough due to bacteria.  When patients have a fever and a productive cough with a change in color or increased sputum production, we are concerned about bacterial bronchitis.  If left untreated it can progress to pneumonia.  If your symptoms do not improve with your treatment plan it is important that you contact your provider.   I have prescribed Doxycycline 100 mg twice a day for 7 days     In addition you may use A prescription cough medication called Tessalon Perles 100mg . You may take 1-2 capsules every 8 hours as needed for your cough. We have also prescribed a steroid to help with your cough Prednisone 10 mg twice daily for 5 days  From your responses in the eVisit questionnaire you describe inflammation in the upper respiratory tract which is causing a significant cough.  This is commonly called Bronchitis and has four common causes:   Allergies Viral Infections Acid Reflux Bacterial Infection Allergies, viruses and acid reflux are treated by controlling symptoms or eliminating the cause. An example might be a cough caused by taking certain blood pressure medications. You stop the cough by changing the medication. Another example might be a cough caused by acid reflux. Controlling the reflux helps control the cough.     HOME CARE Only take medications as instructed by your medical team. Complete the entire course of an antibiotic. Drink plenty of fluids and get plenty of rest. Avoid close contacts especially the very young and the elderly Cover your mouth if you cough or cough into your sleeve. Always remember to wash your hands A steam or ultrasonic humidifier can help congestion.   GET HELP RIGHT AWAY IF: You develop worsening fever. You become short of breath You cough up blood. Your symptoms persist after you have completed your treatment  plan MAKE SURE YOU  Understand these instructions. Will watch your condition. Will get help right away if you are not doing well or get worse.    Thank you for choosing an e-visit.  Your e-visit answers were reviewed by a board certified advanced clinical practitioner to complete your personal care plan. Depending upon the condition, your plan could have included both over the counter or prescription medications.  Please review your pharmacy choice. Make sure the pharmacy is open so you can pick up prescription now. If there is a problem, you may contact your provider through [employer] and have the prescription routed to another pharmacy.  Your safety is important to [place]. If you have drug allergies check your prescription carefully.   For the next 24 hours you can use MyChart to ask questions about today's visit, request a non-urgent call back, or ask for a work or school excuse. You will get an email in the next two days asking about your experience. I hope that your e-visit has been valuable and will speed your recovery.   I spent approximately 5 minutes reviewing the patient's history, current symptoms and coordinating their plan of care today.    Meds ordered this encounter  Medications   doxycycline (VIBRA-TABS) 100 MG tablet    Sig: Take 1 tablet (100 mg total) by mouth 2 (two) times daily for 7 days.    Dispense:  14 tablet    Refill:  0   benzonatate (TESSALON) 100 MG capsule  Sig: Take 1 capsule (100 mg total) by mouth 3 (three) times daily as needed for up to 10 days for cough.    Dispense:  30 capsule    Refill:  0   predniSONE (DELTASONE) 10 MG tablet    Sig: Take 1 tablet (10 mg total) by mouth 2 (two) times daily with a meal for 5 days.    Dispense:  10 tablet    Refill:  0

## 2021-05-02 ENCOUNTER — Telehealth: Payer: Self-pay | Admitting: Hematology

## 2021-05-02 NOTE — Telephone Encounter (Signed)
Unable to leave message with follow-up appointments per 1/13 los. Mailed calendar.

## 2021-05-03 NOTE — Progress Notes (Addendum)
HEMATOLOGY/ONCOLOGY CLNIC NOTE  Date of Service: .04/27/2021   Patient Care Team: Antony Contras, MD as PCP - General (Family Medicine)  CHIEF COMPLAINTS/PURPOSE OF CONSULTATION:  Continued follow-up for surveillance of extranodal retroperitoneal seminoma  HISTORY OF PRESENTING ILLNESS:  Please see previous note for details on initial presentation.   Oncology History  Seminoma ([place])  12/10/2017 Initial Diagnosis   Seminoma ([place])   12/22/2017 - 03/02/2018 Chemotherapy   The patient had dexamethasone (DECADRON) 4 MG tablet, 1 of 1 cycle, Start date: 12/10/2017, End date: 05/05/2019 palonosetron (ALOXI) injection 0.25 mg, 0.25 mg, Intravenous,  Once, 4 of 4 cycles Administration: 0.25 mg (12/22/2017), 0.25 mg (12/24/2017), 0.25 mg (12/26/2017), 0.25 mg (01/12/2018), 0.25 mg (01/14/2018), 0.25 mg (01/16/2018), 0.25 mg (02/02/2018), 0.25 mg (02/04/2018), 0.25 mg (02/06/2018), 0.25 mg (02/23/2018), 0.25 mg (02/25/2018), 0.25 mg (02/27/2018) pegfilgrastim-cbqv (UDENYCA) injection 6 mg, 6 mg, Subcutaneous, Once, 4 of 4 cycles Administration: 6 mg (12/29/2017), 6 mg (01/19/2018), 6 mg (02/09/2018), 6 mg (03/02/2018) CISplatin (PLATINOL) 52 mg in sodium chloride 0.9 % 250 mL chemo infusion, 20 mg/m2 = 52 mg, Intravenous,  Once, 4 of 4 cycles Administration: 52 mg (12/22/2017), 52 mg (12/23/2017), 52 mg (12/24/2017), 52 mg (12/25/2017), 52 mg (12/26/2017), 52 mg (01/12/2018), 52 mg (01/13/2018), 52 mg (01/14/2018), 52 mg (01/15/2018), 52 mg (01/16/2018), 52 mg (02/02/2018), 52 mg (02/03/2018), 52 mg (02/04/2018), 52 mg (02/05/2018), 52 mg (02/06/2018), 52 mg (02/23/2018), 52 mg (02/24/2018), 52 mg (02/25/2018), 52 mg (02/26/2018), 52 mg (02/27/2018) etoposide (VEPESID) 260 mg in sodium chloride 0.9 % 1,000 mL chemo infusion, 100 mg/m2, Intravenous,  Once, 1 of 1 cycle Administration: 260 mg (12/22/2017) etoposide (VEPESID) 260 mg in sodium chloride 0.9 % 650 mL chemo infusion, 100 mg/m2 = 260 mg, Intravenous,  Once, 4 of  4 cycles Administration: 260 mg (12/23/2017), 260 mg (12/24/2017), 260 mg (12/25/2017), 260 mg (12/26/2017), 260 mg (01/12/2018), 260 mg (01/13/2018), 260 mg (01/14/2018), 260 mg (01/15/2018), 260 mg (01/16/2018), 260 mg (02/02/2018), 260 mg (02/03/2018), 260 mg (02/04/2018), 260 mg (02/05/2018), 260 mg (02/06/2018), 260 mg (02/23/2018), 260 mg (02/24/2018), 260 mg (02/25/2018), 260 mg (02/26/2018), 260 mg (02/27/2018) fosaprepitant (EMEND) 150 mg, dexamethasone (DECADRON) 12 mg in sodium chloride 0.9 % 145 mL IVPB, , Intravenous,  Once, 4 of 4 cycles Administration:  (12/22/2017),  (12/24/2017),  (12/26/2017),  (01/12/2018),  (01/14/2018),  (01/16/2018),  (02/02/2018),  (02/04/2018),  (02/06/2018),  (02/23/2018),  (02/25/2018),  (02/27/2018)   for chemotherapy treatment.       Interval History:   Neil Berg is here for a scheduled follow-up for stage II extra-gonadal retroperitoneal Seminoma.  His last clinic visit with [place] was about 6 months ago on 11/03/2020.  He notes he has been very busy at work has been feeling well. No new focal symptoms. No abdominal pain or distention.  No new back pains. No change in bowel habits.  No change in urinary habits. No new testicular pain or swelling. No other acute new focal symptoms.  Patient had a CT chest without contrast on 04/17/2021 to follow-up on a pulmonary nodule.  The 1 mm posterior right pulmonary nodule identified as new on the prior study has resolved in the interval.  No additional suspicious pulmonary nodule or mass noted. Patient's labs from 04/13/2021 show normal CBC Normal CMP Tumor markers LDH, beta-hCG and AFP tumor marker not elevated.    MEDICAL HISTORY:  Past Medical History:  Diagnosis Date   Chest pain    Diabetes mellitus without complication ([place])  Hypertension    Obesity   Type II Diabetes Mellitus   SURGICAL HISTORY: Past Surgical History:  Procedure Laterality Date   EYE SURGERY     left eye cataract removal / implant     IR IMAGING GUIDED PORT INSERTION  12/11/2017   IR REMOVAL TUN ACCESS W/ PORT W/O FL MOD SED  05/01/2018   TONSILLECTOMY      Tonsillectomy 1974 Cataract surgery-left eye 04/2015 Colonoscopy 06/04/16  SOCIAL HISTORY: Social History   Socioeconomic History   Marital status: Married    Spouse name: Not on file   Number of children: Not on file   Years of education: Not on file   Highest education level: Not on file  Occupational History   Not on file  Tobacco Use   Smoking status: Never   Smokeless tobacco: Never  Vaping Use   Vaping Use: Never used  Substance and Sexual Activity   Alcohol use: No   Drug use: No   Sexual activity: Not on file  Other Topics Concern   Not on file  Social History Narrative   Not on file   Social Determinants of Health   Financial Resource Strain: Not on file  Food Insecurity: Not on file  Transportation Needs: Not on file  Physical Activity: Not on file  Stress: Not on file  Social Connections: Not on file  Intimate Partner Violence: Not on file    FAMILY HISTORY: Family History  Problem Relation Age of Onset   Heart disease Maternal Grandmother    Heart attack Maternal Grandfather     ALLERGIES:  is allergic to crestor [rosuvastatin] and metformin.  MEDICATIONS:  Current Outpatient Medications  Medication Sig Dispense Refill   amLODipine (NORVASC) 10 MG tablet Take 10 mg by mouth daily.     amLODipine (NORVASC) 5 MG tablet Take 1 tablet (5 mg total) by mouth daily. 30 tablet 11   aspirin 325 MG tablet 1 table     aspirin EC 81 MG tablet Take 81 mg by mouth daily.     atorvastatin (LIPITOR) 40 MG tablet Take 40 mg by mouth daily.     b complex vitamins tablet Take 1 tablet by mouth daily.     benzonatate (TESSALON) 100 MG capsule Take 1 capsule (100 mg total) by mouth 3 (three) times daily as needed for up to 10 days for cough. 30 capsule 0   doxycycline (VIBRA-TABS) 100 MG tablet Take 1 tablet (100 mg total) by mouth 2 (two)  times daily for 7 days. 14 tablet 0   empagliflozin (JARDIANCE) 10 MG TABS tablet 1 tablet     JARDIANCE 25 MG TABS tablet TK 1 T PO QAM  0   lisinopril (PRINIVIL,ZESTRIL) 20 MG tablet TAKE 1 TABLET BY MOUTH EVERY DAY. DISCONTINUE LISINOPRIL/HCTZ COMBINATION 30 tablet 0   No current facility-administered medications for this visit.    REVIEW OF SYSTEMS:   .10 Point review of Systems was done is negative except as noted above.   PHYSICAL EXAMINATION: ECOG PERFORMANCE STATUS: 1 - Symptomatic but completely ambulatory  .BP 127/77    Pulse 92    Temp 97.9 F (36.6 C)    Resp 20    Wt 265 lb 11.2 oz (120.5 kg)    SpO2 95%    BMI 36.04 kg/m  NAD . GENERAL:alert, in no acute distress and comfortable SKIN: no acute rashes, no significant lesions EYES: conjunctiva are pink and non-injected, sclera anicteric OROPHARYNX: MMM, no exudates, no oropharyngeal  erythema or ulceration NECK: supple, no JVD LYMPH:  no palpable lymphadenopathy in the cervical, axillary or inguinal regions LUNGS: clear to auscultation b/l with normal respiratory effort HEART: regular rate & rhythm ABDOMEN:  normoactive bowel sounds , non tender, not distended. Extremity: no pedal edema PSYCH: alert & oriented x 3 with fluent speech NEURO: no focal motor/sensory deficits    LABORATORY DATA:  I have reviewed the data as listed  . CBC Latest Ref Rng & Units 04/13/2021 10/20/2020 05/05/2020  WBC 4.0 - 10.5 K/uL 6.8 8.5 8.4  Hemoglobin 13.0 - 17.0 g/dL 16.1 15.0 16.0  Hematocrit 39.0 - 52.0 % 48.2 44.6 48.8  Platelets 150 - 400 K/uL 231 257 238  ANC 300  . CMP Latest Ref Rng & Units 04/13/2021 10/20/2020 05/05/2020  Glucose 70 - 99 mg/dL 117(H) 120(H) 135(H)  BUN 6 - 20 mg/dL 12 19 18   Creatinine 0.61 - 1.24 mg/dL 0.79 0.92 0.82  Sodium 135 - 145 mmol/L 136 138 138  Potassium 3.5 - 5.1 mmol/L 3.8 4.3 4.2  Chloride 98 - 111 mmol/L 98 102 102  CO2 22 - 32 mmol/L 29 26 25   Calcium 8.9 - 10.3 mg/dL 9.4 9.1 9.2   Total Protein 6.5 - 8.1 g/dL 7.7 7.1 7.4  Total Bilirubin 0.3 - 1.2 mg/dL 0.3 0.4 0.5  Alkaline Phos 38 - 126 U/L 98 101 110  AST 15 - 41 U/L 25 18 16   ALT 0 - 44 U/L 33 20 22   Component     Latest Ref Rng & Units 04/13/2021  AFP, Serum, Tumor Marker     0.0 - 8.4 ng/mL <1.8  hCG Quant     0 - 3 mIU/mL <1  LDH     98 - 192 U/L 150    Pathology:   11/25/17 Tissue Flow Cytometry:       RADIOGRAPHIC STUDIES: I have personally reviewed the radiological images as listed and agreed with the findings in the report. CT Chest Wo Contrast  Result Date: 04/17/2021 CLINICAL DATA:  Pulmonary nodule. EXAM: CT CHEST WITHOUT CONTRAST TECHNIQUE: Multidetector CT imaging of the chest was performed following the standard protocol without IV contrast. COMPARISON:  10/20/2020 FINDINGS: Cardiovascular: The heart size is normal. No substantial pericardial effusion. Coronary artery calcification is evident. Mild atherosclerotic calcification is noted in the wall of the thoracic aorta. Mediastinum/Nodes: No mediastinal lymphadenopathy. No evidence for gross hilar lymphadenopathy although assessment is limited by the lack of intravenous contrast on the current study. The esophagus has normal imaging features. There is no axillary lymphadenopathy. Lungs/Pleura: The 1 mm posterior right pulmonary nodule identified as new on the prior study has resolved in the interval. No additional suspicious pulmonary nodule or mass. No focal airspace consolidation. No pleural effusion. Upper Abdomen: No focal abnormality in the liver on this study without intravenous contrast. Hypoattenuating lesions in the upper pole left kidney cannot be definitively characterized but are likely cysts, stable in the interval. Musculoskeletal: No worrisome lytic or sclerotic osseous abnormality. IMPRESSION: 1. The 1 mm posterior right pulmonary nodule identified as new on the prior study has resolved in the interval. No additional suspicious  pulmonary nodule or mass. 2. Aortic Atherosclerosis (ICD10-I70.0). Electronically Signed   By: Misty Stanley M.D.   On: 04/17/2021 08:00    ASSESSMENT & PLAN:   61 y.o. male with  1. 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 Prostate enlargement.  11/17/17 PET/CT revealed Isolated hypermetabolic retroperitoneal lymphadenopathy. Findings suspicious for lymphoma as no primary lesion is identified to suggest this is metastatic adenopathy. Recommend retroperitoneal biopsy. The largest left-sided node appears necrotic and is actually decreased in size since the prior CT scan. It appeared inflamed on the prior study and may have infarcted. I would not recommend biopsying this lesion. No adenopathy in the neck, axilla, chest, pelvis or inguinal regions. No worrisome pulmonary lesions or osseous lesions.  11/25/17 pathology results revealed  finding of metastatic seminoma    12/04/17 US Scrotum w/doppler revealed Negative scrotal ultrasound with no distinct testicular mass Identified. 2. 5 mm simple left epididymal cyst, almost certainly benign and felt to be incidental in nature.   S/p 4 cycles of EP treatment, tolerated well overall  04/09/18 CT C/A/P revealed "no metastatic adenopathy in the chest. Abdomen/Pelvis: 1. Interval reduction in volume of LEFT periaortic metastatic lymph node. 2. No new adenopathy in the abdomen pelvis. 3. Benign-appearing splenic in LEFT renal cystic lesions."  11/26/2018 CT CAP revealed "1. Stable mild left abdominal retroperitoneal lymphadenopathy. 2. No new or progressive metastatic disease within the chest, abdomen, or pelvis. 3. Stable mildly enlarged prostate."  2. Chemotherapy related neutropenia -- resolved.  PLAN:   Patient had a CT chest without contrast on 04/17/2021 to follow-up on a pulmonary nodule.-Which was discussed with him and showed the 1 mm posterior right pulmonary nodule identified as new on the prior study has resolved in the interval.  No additional suspicious pulmonary nodule or mass noted. We discussed labs from 04/13/2021 show normal CBC Normal CMP Tumor markers LDH, beta-hCG and AFP tumor marker not elevated -Patient currently has no lab or clinical findings suggestive of recurrence/progression of his retroperitoneal seminoma. -No indications for additional treatment at this time. -Continue active surveillance  FOLLOW UP: Labs in 23 weeks RTC with Dr Irene Limbo in 24 weeks  All of the patient's questions were answered with apparent satisfaction. The patient knows to call the clinic with any problems, questions or concerns.   Sullivan Lone MD [place] AAHIVMS [facility] Hematology/Oncology Physician [facility]

## 2021-10-04 ENCOUNTER — Other Ambulatory Visit: Payer: Self-pay

## 2021-10-04 DIAGNOSIS — C6290 Malignant neoplasm of unspecified testis, unspecified whether descended or undescended: Secondary | ICD-10-CM

## 2021-10-05 ENCOUNTER — Inpatient Hospital Stay: Payer: PRIVATE HEALTH INSURANCE | Attending: Hematology

## 2021-10-05 ENCOUNTER — Other Ambulatory Visit: Payer: Self-pay

## 2021-10-05 DIAGNOSIS — N400 Enlarged prostate without lower urinary tract symptoms: Secondary | ICD-10-CM | POA: Insufficient documentation

## 2021-10-05 DIAGNOSIS — Z9221 Personal history of antineoplastic chemotherapy: Secondary | ICD-10-CM | POA: Diagnosis not present

## 2021-10-05 DIAGNOSIS — Z7984 Long term (current) use of oral hypoglycemic drugs: Secondary | ICD-10-CM | POA: Diagnosis not present

## 2021-10-05 DIAGNOSIS — C6290 Malignant neoplasm of unspecified testis, unspecified whether descended or undescended: Secondary | ICD-10-CM

## 2021-10-05 DIAGNOSIS — Z79899 Other long term (current) drug therapy: Secondary | ICD-10-CM | POA: Insufficient documentation

## 2021-10-05 DIAGNOSIS — Z7982 Long term (current) use of aspirin: Secondary | ICD-10-CM | POA: Diagnosis not present

## 2021-10-05 DIAGNOSIS — C480 Malignant neoplasm of retroperitoneum: Secondary | ICD-10-CM | POA: Diagnosis present

## 2021-10-05 LAB — CBC WITH DIFFERENTIAL (CANCER CENTER ONLY)
Abs Immature Granulocytes: 0.02 10*3/uL (ref 0.00–0.07)
Basophils Absolute: 0 10*3/uL (ref 0.0–0.1)
Basophils Relative: 0 %
Eosinophils Absolute: 0.2 10*3/uL (ref 0.0–0.5)
Eosinophils Relative: 2 %
HCT: 48.7 % (ref 39.0–52.0)
Hemoglobin: 16.5 g/dL (ref 13.0–17.0)
Immature Granulocytes: 0 %
Lymphocytes Relative: 25 %
Lymphs Abs: 2.6 10*3/uL (ref 0.7–4.0)
MCH: 28.4 pg (ref 26.0–34.0)
MCHC: 33.9 g/dL (ref 30.0–36.0)
MCV: 84 fL (ref 80.0–100.0)
Monocytes Absolute: 0.7 10*3/uL (ref 0.1–1.0)
Monocytes Relative: 7 %
Neutro Abs: 7.1 10*3/uL (ref 1.7–7.7)
Neutrophils Relative %: 66 %
Platelet Count: 263 10*3/uL (ref 150–400)
RBC: 5.8 MIL/uL (ref 4.22–5.81)
RDW: 12.7 % (ref 11.5–15.5)
WBC Count: 10.7 10*3/uL — ABNORMAL HIGH (ref 4.0–10.5)
nRBC: 0 % (ref 0.0–0.2)

## 2021-10-05 LAB — LACTATE DEHYDROGENASE: LDH: 137 U/L (ref 98–192)

## 2021-10-05 LAB — CMP (CANCER CENTER ONLY)
ALT: 20 U/L (ref 0–44)
AST: 15 U/L (ref 15–41)
Albumin: 4.7 g/dL (ref 3.5–5.0)
Alkaline Phosphatase: 113 U/L (ref 38–126)
Anion gap: 9 (ref 5–15)
BUN: 16 mg/dL (ref 6–20)
CO2: 28 mmol/L (ref 22–32)
Calcium: 9.8 mg/dL (ref 8.9–10.3)
Chloride: 99 mmol/L (ref 98–111)
Creatinine: 0.83 mg/dL (ref 0.61–1.24)
GFR, Estimated: >60 mL/min (ref >60)
Glucose, Bld: 139 mg/dL — ABNORMAL HIGH (ref 70–99)
Potassium: 4.2 mmol/L (ref 3.5–5.1)
Sodium: 136 mmol/L (ref 135–145)
Total Bilirubin: 0.5 mg/dL (ref 0.3–1.2)
Total Protein: 7.6 g/dL (ref 6.5–8.1)

## 2021-10-06 LAB — AFP TUMOR MARKER: AFP, Serum, Tumor Marker: 2.3 ng/mL (ref 0.0–8.4)

## 2021-10-06 LAB — BETA HCG QUANT (REF LAB): hCG Quant: <1 m[IU]/mL (ref 0–3)

## 2021-10-12 ENCOUNTER — Inpatient Hospital Stay (HOSPITAL_BASED_OUTPATIENT_CLINIC_OR_DEPARTMENT_OTHER): Payer: PRIVATE HEALTH INSURANCE | Admitting: Hematology

## 2021-10-12 ENCOUNTER — Other Ambulatory Visit: Payer: Self-pay

## 2021-10-12 VITALS — BP 131/77 | HR 77 | Temp 98.5°F | Resp 18 | Ht 72.0 in | Wt 275.4 lb

## 2021-10-12 DIAGNOSIS — C6290 Malignant neoplasm of unspecified testis, unspecified whether descended or undescended: Secondary | ICD-10-CM

## 2021-10-12 DIAGNOSIS — C480 Malignant neoplasm of retroperitoneum: Secondary | ICD-10-CM | POA: Diagnosis not present

## 2021-10-12 NOTE — Progress Notes (Signed)
HEMATOLOGY/ONCOLOGY CLNIC NOTE  Date of Service: 10/12/2021   Patient Care Team: Antony Contras, MD as PCP - General (Family Medicine)  CHIEF COMPLAINTS/PURPOSE OF CONSULTATION:  Continued follow-up for surveillance of extranodal retroperitoneal seminoma  HISTORY OF PRESENTING ILLNESS:  Please see previous note for details on initial presentation.   Oncology History  Seminoma ([place])  12/10/2017 Initial Diagnosis   Seminoma ([place])   12/22/2017 - 03/02/2018 Chemotherapy   The patient had dexamethasone (DECADRON) 4 MG tablet, 1 of 1 cycle, Start date: 12/10/2017, End date: 05/05/2019 palonosetron (ALOXI) injection 0.25 mg, 0.25 mg, Intravenous,  Once, 4 of 4 cycles Administration: 0.25 mg (12/22/2017), 0.25 mg (12/24/2017), 0.25 mg (12/26/2017), 0.25 mg (01/12/2018), 0.25 mg (01/14/2018), 0.25 mg (01/16/2018), 0.25 mg (02/02/2018), 0.25 mg (02/04/2018), 0.25 mg (02/06/2018), 0.25 mg (02/23/2018), 0.25 mg (02/25/2018), 0.25 mg (02/27/2018) pegfilgrastim-cbqv (UDENYCA) injection 6 mg, 6 mg, Subcutaneous, Once, 4 of 4 cycles Administration: 6 mg (12/29/2017), 6 mg (01/19/2018), 6 mg (02/09/2018), 6 mg (03/02/2018) CISplatin (PLATINOL) 52 mg in sodium chloride 0.9 % 250 mL chemo infusion, 20 mg/m2 = 52 mg, Intravenous,  Once, 4 of 4 cycles Administration: 52 mg (12/22/2017), 52 mg (12/23/2017), 52 mg (12/24/2017), 52 mg (12/25/2017), 52 mg (12/26/2017), 52 mg (01/12/2018), 52 mg (01/13/2018), 52 mg (01/14/2018), 52 mg (01/15/2018), 52 mg (01/16/2018), 52 mg (02/02/2018), 52 mg (02/03/2018), 52 mg (02/04/2018), 52 mg (02/05/2018), 52 mg (02/06/2018), 52 mg (02/23/2018), 52 mg (02/24/2018), 52 mg (02/25/2018), 52 mg (02/26/2018), 52 mg (02/27/2018) etoposide (VEPESID) 260 mg in sodium chloride 0.9 % 1,000 mL chemo infusion, 100 mg/m2, Intravenous,  Once, 1 of 1 cycle Administration: 260 mg (12/22/2017) etoposide (VEPESID) 260 mg in sodium chloride 0.9 % 650 mL chemo infusion, 100 mg/m2 = 260 mg, Intravenous,  Once, 4 of 4  cycles Administration: 260 mg (12/23/2017), 260 mg (12/24/2017), 260 mg (12/25/2017), 260 mg (12/26/2017), 260 mg (01/12/2018), 260 mg (01/13/2018), 260 mg (01/14/2018), 260 mg (01/15/2018), 260 mg (01/16/2018), 260 mg (02/02/2018), 260 mg (02/03/2018), 260 mg (02/04/2018), 260 mg (02/05/2018), 260 mg (02/06/2018), 260 mg (02/23/2018), 260 mg (02/24/2018), 260 mg (02/25/2018), 260 mg (02/26/2018), 260 mg (02/27/2018) fosaprepitant (EMEND) 150 mg, dexamethasone (DECADRON) 12 mg in sodium chloride 0.9 % 145 mL IVPB, , Intravenous,  Once, 4 of 4 cycles Administration:  (12/22/2017),  (12/24/2017),  (12/26/2017),  (01/12/2018),  (01/14/2018),  (01/16/2018),  (02/02/2018),  (02/04/2018),  (02/06/2018),  (02/23/2018),  (02/25/2018),  (02/27/2018)  for chemotherapy treatment.      INTERVAL HISTORY: Neil Berg is a 61 y.o. male is here for a scheduled follow-up for stage II extra-gonadal retroperitoneal Seminoma. He reports He is doing well with no new symptoms or concerns.  His last clinic visit with [place] was about 6 months ago on 04/27/2021.  He notes excitement about becoming a first time grandfather with his daughter giving birth in the next week.  No new focal symptoms. No new infection issues. No abdominal pain or distention.  No new back pains. No change in bowel habits.  No change in urinary habits. No new testicular pain or swelling. No other acute new focal symptoms.  Labs done 10/05/2021 were reviewed in detail.  MEDICAL HISTORY:  Past Medical History:  Diagnosis Date   Chest pain    Diabetes mellitus without complication ([place])    Hypertension    Obesity   Type II Diabetes Mellitus   SURGICAL HISTORY: Past Surgical History:  Procedure Laterality Date   EYE SURGERY     left eye cataract removal /  implant    IR IMAGING GUIDED PORT INSERTION  12/11/2017   IR REMOVAL TUN ACCESS W/ PORT W/O FL MOD SED  05/01/2018   TONSILLECTOMY      Tonsillectomy 1974 Cataract surgery-left eye  04/2015 Colonoscopy 06/04/16  SOCIAL HISTORY: Social History   Socioeconomic History   Marital status: Married    Spouse name: Not on file   Number of children: Not on file   Years of education: Not on file   Highest education level: Not on file  Occupational History   Not on file  Tobacco Use   Smoking status: Never   Smokeless tobacco: Never  Vaping Use   Vaping Use: Never used  Substance and Sexual Activity   Alcohol use: No   Drug use: No   Sexual activity: Not on file  Other Topics Concern   Not on file  Social History Narrative   Not on file   Social Determinants of Health   Financial Resource Strain: Not on file  Food Insecurity: Not on file  Transportation Needs: Not on file  Physical Activity: Not on file  Stress: Not on file  Social Connections: Not on file  Intimate Partner Violence: Not on file    FAMILY HISTORY: Family History  Problem Relation Age of Onset   Heart disease Maternal Grandmother    Heart attack Maternal Grandfather     ALLERGIES:  is allergic to crestor [rosuvastatin] and metformin.  MEDICATIONS:  Current Outpatient Medications  Medication Sig Dispense Refill   amLODipine (NORVASC) 10 MG tablet Take 10 mg by mouth daily.     amLODipine (NORVASC) 5 MG tablet Take 1 tablet (5 mg total) by mouth daily. 30 tablet 11   aspirin 325 MG tablet 1 table     aspirin EC 81 MG tablet Take 81 mg by mouth daily.     atorvastatin (LIPITOR) 40 MG tablet Take 40 mg by mouth daily.     b complex vitamins tablet Take 1 tablet by mouth daily.     empagliflozin (JARDIANCE) 10 MG TABS tablet 1 tablet     JARDIANCE 25 MG TABS tablet TK 1 T PO QAM  0   lisinopril (PRINIVIL,ZESTRIL) 20 MG tablet TAKE 1 TABLET BY MOUTH EVERY DAY. DISCONTINUE LISINOPRIL/HCTZ COMBINATION 30 tablet 0   No current facility-administered medications for this visit.    REVIEW OF SYSTEMS:   .10 Point review of Systems was done is negative except as noted above.   PHYSICAL  EXAMINATION: ECOG PERFORMANCE STATUS: 1 - Symptomatic but completely ambulatory  .BP 131/77 (BP Location: Left Arm, Patient Position: Sitting)   Pulse 77   Temp 98.5 F (36.9 C) (Temporal)   Resp 18   Ht 6' (1.829 m)   Wt 275 lb 6.4 oz (124.9 kg)   SpO2 99%   BMI 37.35 kg/m  NAD*** GENERAL:alert, in no acute distress and comfortable SKIN: no acute rashes, no significant lesions EYES: conjunctiva are pink and non-injected, sclera anicteric NECK: supple, no JVD LYMPH:  no palpable lymphadenopathy in the cervical, axillary or inguinal regions LUNGS: clear to auscultation b/l with normal respiratory effort HEART: regular rate & rhythm ABDOMEN:  normoactive bowel sounds , non tender, not distended. Extremity: no pedal edema PSYCH: alert & oriented x 3 with fluent speech NEURO: no focal motor/sensory deficits  LABORATORY DATA:  I have reviewed the data as listed  .    Latest Ref Rng & Units 10/05/2021   12:19 PM 04/13/2021   12:43 PM 10/20/2020  10:53 AM  CBC  WBC 4.0 - 10.5 K/uL 10.7  6.8  8.5   Hemoglobin 13.0 - 17.0 g/dL 16.5  16.1  15.0   Hematocrit 39.0 - 52.0 % 48.7  48.2  44.6   Platelets 150 - 400 K/uL 263  231  257   ANC 300  .    Latest Ref Rng & Units 10/05/2021   12:19 PM 04/13/2021   12:43 PM 10/20/2020   10:53 AM  CMP  Glucose 70 - 99 mg/dL 139  117  120   BUN 6 - 20 mg/dL '16  12  19   '$ Creatinine 0.61 - 1.24 mg/dL 0.83  0.79  0.92   Sodium 135 - 145 mmol/L 136  136  138   Potassium 3.5 - 5.1 mmol/L 4.2  3.8  4.3   Chloride 98 - 111 mmol/L 99  98  102   CO2 22 - 32 mmol/L '28  29  26   '$ Calcium 8.9 - 10.3 mg/dL 9.8  9.4  9.1   Total Protein 6.5 - 8.1 g/dL 7.6  7.7  7.1   Total Bilirubin 0.3 - 1.2 mg/dL 0.5  0.3  0.4   Alkaline Phos 38 - 126 U/L 113  98  101   AST 15 - 41 U/L '15  25  18   '$ ALT 0 - 44 U/L 20  33  20    Component     Latest Ref Rng & Units 04/13/2021  AFP, Serum, Tumor Marker     0.0 - 8.4 ng/mL <1.8  hCG Quant     0 - 3 mIU/mL <1   LDH     98 - 192 U/L 150    Pathology:   11/25/17 Tissue Flow Cytometry:       RADIOGRAPHIC STUDIES: I have personally reviewed the radiological images as listed and agreed with the findings in the report. No results found.  ASSESSMENT & PLAN:   61 y.o. male with  1. 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 Prostate enlargement.  11/17/17 PET/CT revealed Isolated hypermetabolic retroperitoneal lymphadenopathy. Findings suspicious for lymphoma as no primary lesion is identified to suggest this is metastatic adenopathy. Recommend retroperitoneal biopsy. The largest left-sided node appears necrotic and is actually decreased in size since the prior CT scan. It appeared inflamed on the prior study and may have infarcted. I would not recommend biopsying this lesion. No adenopathy in the neck, axilla, chest, pelvis or inguinal regions. No worrisome pulmonary lesions or osseous lesions.  11/25/17 pathology results revealed  finding of metastatic seminoma    12/04/17 US Scrotum w/doppler revealed Negative scrotal ultrasound with no distinct testicular mass Identified. 2. 5 mm simple left epididymal cyst, almost certainly benign and felt to be incidental in nature.   S/p 4 cycles of EP treatment, tolerated well overall  04/09/18 CT C/A/P revealed "no metastatic adenopathy in the chest. Abdomen/Pelvis: 1. Interval reduction in volume of LEFT periaortic metastatic lymph node. 2. No new adenopathy in the abdomen pelvis. 3. Benign-appearing splenic in LEFT renal cystic lesions."  11/26/2018 CT CAP revealed "1. Stable mild left abdominal retroperitoneal lymphadenopathy. 2. No new or progressive metastatic disease within the chest, abdomen, or pelvis. 3.  Stable mildly enlarged prostate."  2. Chemotherapy related neutropenia -- resolved.  PLAN:  We discussed  labs from 06/23/203 show normal CBC Normal CMP Tumor markers LDH normal, beta-hCG and AFP tumor marker not elevated. -Patient currently has no lab or clinical findings suggestive of recurrence/progression of his retroperitoneal seminoma. -No indications for additional treatment at this time. -Continue active surveillance  FOLLOW UP: Labs in 2nd week of jan 2024 CT CAP in 2nd week of jan 2024 RTC with Dr Irene Limbo in 3rd week of Jan 2024  All of the patient's questions were answered with apparent satisfaction. The patient knows to call the clinic with any problems, questions or concerns.   Sullivan Lone MD [place] AAHIVMS [facility] Hematology/Oncology Physician [facility]  I, Melene Muller, am acting as scribe for Dr. Sullivan Lone, MD.

## 2022-04-26 ENCOUNTER — Inpatient Hospital Stay: Payer: PRIVATE HEALTH INSURANCE | Attending: Hematology

## 2022-04-26 ENCOUNTER — Other Ambulatory Visit: Payer: Self-pay

## 2022-04-26 ENCOUNTER — Ambulatory Visit (HOSPITAL_COMMUNITY)
Admission: RE | Admit: 2022-04-26 | Discharge: 2022-04-26 | Disposition: A | Discharge status: Home / Self Care | Payer: PRIVATE HEALTH INSURANCE | Source: Ambulatory Visit | Attending: Hematology | Admitting: Hematology

## 2022-04-26 DIAGNOSIS — C6290 Malignant neoplasm of unspecified testis, unspecified whether descended or undescended: Secondary | ICD-10-CM

## 2022-04-26 DIAGNOSIS — N400 Enlarged prostate without lower urinary tract symptoms: Secondary | ICD-10-CM | POA: Diagnosis not present

## 2022-04-26 DIAGNOSIS — Z8547 Personal history of malignant neoplasm of testis: Secondary | ICD-10-CM | POA: Insufficient documentation

## 2022-04-26 LAB — CBC WITH DIFFERENTIAL (CANCER CENTER ONLY)
Abs Immature Granulocytes: 0.04 10*3/uL (ref 0.00–0.07)
Basophils Absolute: 0 10*3/uL (ref 0.0–0.1)
Basophils Relative: 0 %
Eosinophils Absolute: 0.2 10*3/uL (ref 0.0–0.5)
Eosinophils Relative: 2 %
HCT: 48.4 % (ref 39.0–52.0)
Hemoglobin: 16.7 g/dL (ref 13.0–17.0)
Immature Granulocytes: 0 %
Lymphocytes Relative: 23 %
Lymphs Abs: 2.5 10*3/uL (ref 0.7–4.0)
MCH: 29 pg (ref 26.0–34.0)
MCHC: 34.5 g/dL (ref 30.0–36.0)
MCV: 84 fL (ref 80.0–100.0)
Monocytes Absolute: 0.8 10*3/uL (ref 0.1–1.0)
Monocytes Relative: 7 %
Neutro Abs: 7.5 10*3/uL (ref 1.7–7.7)
Neutrophils Relative %: 68 %
Platelet Count: 287 10*3/uL (ref 150–400)
RBC: 5.76 MIL/uL (ref 4.22–5.81)
RDW: 13 % (ref 11.5–15.5)
WBC Count: 11 10*3/uL — ABNORMAL HIGH (ref 4.0–10.5)
nRBC: 0 % (ref 0.0–0.2)

## 2022-04-26 LAB — CMP (CANCER CENTER ONLY)
ALT: 24 U/L (ref 0–44)
AST: 16 U/L (ref 15–41)
Albumin: 4.5 g/dL (ref 3.5–5.0)
Alkaline Phosphatase: 122 U/L (ref 38–126)
Anion gap: 7 (ref 5–15)
BUN: 20 mg/dL (ref 6–20)
CO2: 32 mmol/L (ref 22–32)
Calcium: 9.9 mg/dL (ref 8.9–10.3)
Chloride: 98 mmol/L (ref 98–111)
Creatinine: 0.87 mg/dL (ref 0.61–1.24)
GFR, Estimated: >60 mL/min (ref >60)
Glucose, Bld: 128 mg/dL — ABNORMAL HIGH (ref 70–99)
Potassium: 4.3 mmol/L (ref 3.5–5.1)
Sodium: 137 mmol/L (ref 135–145)
Total Bilirubin: 0.4 mg/dL (ref 0.3–1.2)
Total Protein: 7.9 g/dL (ref 6.5–8.1)

## 2022-04-26 LAB — LACTATE DEHYDROGENASE: LDH: 141 U/L (ref 98–192)

## 2022-04-26 MED ORDER — IOHEXOL 300 MG/ML  SOLN
100.0000 mL | Freq: Once | INTRAMUSCULAR | Status: AC | PRN
  Administered 2022-04-26: 100 mL via INTRAVENOUS

## 2022-04-30 LAB — AFP TUMOR MARKER: AFP, Serum, Tumor Marker: 3.3 ng/mL (ref 0.0–8.4)

## 2022-04-30 LAB — BETA HCG QUANT (REF LAB): hCG Quant: <1 m[IU]/mL (ref 0–3)

## 2022-05-03 ENCOUNTER — Ambulatory Visit: Payer: PRIVATE HEALTH INSURANCE | Admitting: Hematology

## 2022-05-31 ENCOUNTER — Inpatient Hospital Stay: Payer: PRIVATE HEALTH INSURANCE | Attending: Hematology | Admitting: Hematology

## 2022-05-31 VITALS — BP 143/83 | HR 86 | Temp 97.0°F | Resp 16 | Wt 282.2 lb

## 2022-05-31 DIAGNOSIS — Z7982 Long term (current) use of aspirin: Secondary | ICD-10-CM | POA: Insufficient documentation

## 2022-05-31 DIAGNOSIS — Z79899 Other long term (current) drug therapy: Secondary | ICD-10-CM | POA: Diagnosis not present

## 2022-05-31 DIAGNOSIS — C6290 Malignant neoplasm of unspecified testis, unspecified whether descended or undescended: Secondary | ICD-10-CM | POA: Diagnosis not present

## 2022-05-31 DIAGNOSIS — Z9221 Personal history of antineoplastic chemotherapy: Secondary | ICD-10-CM | POA: Insufficient documentation

## 2022-05-31 DIAGNOSIS — Z8547 Personal history of malignant neoplasm of testis: Secondary | ICD-10-CM | POA: Insufficient documentation

## 2022-05-31 DIAGNOSIS — E119 Type 2 diabetes mellitus without complications: Secondary | ICD-10-CM | POA: Insufficient documentation

## 2022-05-31 DIAGNOSIS — Z7984 Long term (current) use of oral hypoglycemic drugs: Secondary | ICD-10-CM | POA: Insufficient documentation

## 2022-05-31 DIAGNOSIS — I10 Essential (primary) hypertension: Secondary | ICD-10-CM | POA: Insufficient documentation

## 2022-05-31 NOTE — Progress Notes (Signed)
HEMATOLOGY/ONCOLOGY CLNIC NOTE  Date of Service: 05/31/2022   Patient Care Team: Antony Contras, MD as PCP - General (Family Medicine)  CHIEF COMPLAINTS/PURPOSE OF CONSULTATION:  Continued follow-up for surveillance of extranodal retroperitoneal seminoma  HISTORY OF PRESENTING ILLNESS:   Please see previous note for details on initial presentation.   Oncology History  Seminoma ([place])  12/10/2017 Initial Diagnosis   Seminoma ([place])   12/22/2017 - 03/02/2018 Chemotherapy   The patient had dexamethasone (DECADRON) 4 MG tablet, 1 of 1 cycle, Start date: 12/10/2017, End date: 05/05/2019 palonosetron (ALOXI) injection 0.25 mg, 0.25 mg, Intravenous,  Once, 4 of 4 cycles Administration: 0.25 mg (12/22/2017), 0.25 mg (12/24/2017), 0.25 mg (12/26/2017), 0.25 mg (01/12/2018), 0.25 mg (01/14/2018), 0.25 mg (01/16/2018), 0.25 mg (02/02/2018), 0.25 mg (02/04/2018), 0.25 mg (02/06/2018), 0.25 mg (02/23/2018), 0.25 mg (02/25/2018), 0.25 mg (02/27/2018) pegfilgrastim-cbqv (UDENYCA) injection 6 mg, 6 mg, Subcutaneous, Once, 4 of 4 cycles Administration: 6 mg (12/29/2017), 6 mg (01/19/2018), 6 mg (02/09/2018), 6 mg (03/02/2018) CISplatin (PLATINOL) 52 mg in sodium chloride 0.9 % 250 mL chemo infusion, 20 mg/m2 = 52 mg, Intravenous,  Once, 4 of 4 cycles Administration: 52 mg (12/22/2017), 52 mg (12/23/2017), 52 mg (12/24/2017), 52 mg (12/25/2017), 52 mg (12/26/2017), 52 mg (01/12/2018), 52 mg (01/13/2018), 52 mg (01/14/2018), 52 mg (01/15/2018), 52 mg (01/16/2018), 52 mg (02/02/2018), 52 mg (02/03/2018), 52 mg (02/04/2018), 52 mg (02/05/2018), 52 mg (02/06/2018), 52 mg (02/23/2018), 52 mg (02/24/2018), 52 mg (02/25/2018), 52 mg (02/26/2018), 52 mg (02/27/2018) etoposide (VEPESID) 260 mg in sodium chloride 0.9 % 1,000 mL chemo infusion, 100 mg/m2, Intravenous,  Once, 1 of 1 cycle Administration: 260 mg (12/22/2017) etoposide (VEPESID) 260 mg in sodium chloride 0.9 % 650 mL chemo infusion, 100 mg/m2 = 260 mg, Intravenous,  Once, 4 of  4 cycles Administration: 260 mg (12/23/2017), 260 mg (12/24/2017), 260 mg (12/25/2017), 260 mg (12/26/2017), 260 mg (01/12/2018), 260 mg (01/13/2018), 260 mg (01/14/2018), 260 mg (01/15/2018), 260 mg (01/16/2018), 260 mg (02/02/2018), 260 mg (02/03/2018), 260 mg (02/04/2018), 260 mg (02/05/2018), 260 mg (02/06/2018), 260 mg (02/23/2018), 260 mg (02/24/2018), 260 mg (02/25/2018), 260 mg (02/26/2018), 260 mg (02/27/2018) fosaprepitant (EMEND) 150 mg, dexamethasone (DECADRON) 12 mg in sodium chloride 0.9 % 145 mL IVPB, , Intravenous,  Once, 4 of 4 cycles Administration:  (12/22/2017),  (12/24/2017),  (12/26/2017),  (01/12/2018),  (01/14/2018),  (01/16/2018),  (02/02/2018),  (02/04/2018),  (02/06/2018),  (02/23/2018),  (02/25/2018),  (02/27/2018)  for chemotherapy treatment.      INTERVAL HISTORY: Neil Berg is a 62 y.o. male is here for a scheduled follow-up for stage II extra-gonadal retroperitoneal Seminoma. Patient was last seen by me on 10/12/21 and was doing well overall with no new medical concerns.   Today, he reports feeling well overall. He denies any back pain, abdominal, leg swelling, chest pain or hip pain. He has no urinary symptoms and follows up with his urologist. His prostate is monitored regularly.  MEDICAL HISTORY:  Past Medical History:  Diagnosis Date   Chest pain    Diabetes mellitus without complication ([place])    Hypertension    Obesity   Type II Diabetes Mellitus   SURGICAL HISTORY: Past Surgical History:  Procedure Laterality Date   EYE SURGERY     left eye cataract removal / implant    IR IMAGING GUIDED PORT INSERTION  12/11/2017   IR REMOVAL TUN ACCESS W/ PORT W/O FL MOD SED  05/01/2018   TONSILLECTOMY      Tonsillectomy 1974 Cataract surgery-left eye 04/2015  Colonoscopy 06/04/16  SOCIAL HISTORY: Social History   Socioeconomic History   Marital status: Married    Spouse name: Not on file   Number of children: Not on file   Years of education: Not on file   Highest  education level: Not on file  Occupational History   Not on file  Tobacco Use   Smoking status: Never   Smokeless tobacco: Never  Vaping Use   Vaping Use: Never used  Substance and Sexual Activity   Alcohol use: No   Drug use: No   Sexual activity: Not on file  Other Topics Concern   Not on file  Social History Narrative   Not on file   Social Determinants of Health   Financial Resource Strain: Not on file  Food Insecurity: Not on file  Transportation Needs: Not on file  Physical Activity: Not on file  Stress: Not on file  Social Connections: Not on file  Intimate Partner Violence: Not on file    FAMILY HISTORY: Family History  Problem Relation Age of Onset   Heart disease Maternal Grandmother    Heart attack Maternal Grandfather     ALLERGIES:  is allergic to crestor [rosuvastatin] and metformin.  MEDICATIONS:  Current Outpatient Medications  Medication Sig Dispense Refill   amLODipine (NORVASC) 10 MG tablet Take 10 mg by mouth daily.     amLODipine (NORVASC) 5 MG tablet Take 1 tablet (5 mg total) by mouth daily. 30 tablet 11   aspirin 325 MG tablet 1 table     aspirin EC 81 MG tablet Take 81 mg by mouth daily.     atorvastatin (LIPITOR) 40 MG tablet Take 40 mg by mouth daily.     b complex vitamins tablet Take 1 tablet by mouth daily.     empagliflozin (JARDIANCE) 10 MG TABS tablet 1 tablet     JARDIANCE 25 MG TABS tablet TK 1 T PO QAM  0   lisinopril (PRINIVIL,ZESTRIL) 20 MG tablet TAKE 1 TABLET BY MOUTH EVERY DAY. DISCONTINUE LISINOPRIL/HCTZ COMBINATION 30 tablet 0   No current facility-administered medications for this visit.    REVIEW OF SYSTEMS:    10 Point review of Systems was done is negative except as noted above.  PHYSICAL EXAMINATION: ECOG PERFORMANCE STATUS: 1 - Symptomatic but completely ambulatory  .BP (!) 143/83   Pulse 86   Temp (!) 97 F (36.1 C)   Resp 16   Wt 282 lb 3.2 oz (128 kg)   SpO2 99%   BMI 38.27 kg/m   GENERAL:alert,  in no acute distress and comfortable SKIN: no acute rashes, no significant lesions EYES: conjunctiva are pink and non-injected, sclera anicteric OROPHARYNX: MMM, no exudates, no oropharyngeal erythema or ulceration NECK: supple, no JVD LYMPH:  no palpable lymphadenopathy in the cervical, axillary or inguinal regions LUNGS: clear to auscultation b/l with normal respiratory effort HEART: regular rate & rhythm ABDOMEN:  normoactive bowel sounds , non tender, not distended. Extremity: no pedal edema PSYCH: alert & oriented x 3 with fluent speech NEURO: no focal motor/sensory deficits    LABORATORY DATA:  I have reviewed the data as listed  .    Latest Ref Rng & Units 04/26/2022   11:02 AM 10/05/2021   12:19 PM 04/13/2021   12:43 PM  CBC  WBC 4.0 - 10.5 K/uL 11.0  10.7  6.8   Hemoglobin 13.0 - 17.0 g/dL 16.7  16.5  16.1   Hematocrit 39.0 - 52.0 % 48.4  48.7  48.2   Platelets 150 - 400 K/uL 287  263  231   ANC 300  .    Latest Ref Rng & Units 04/26/2022   11:02 AM 10/05/2021   12:19 PM 04/13/2021   12:43 PM  CMP  Glucose 70 - 99 mg/dL 128  139  117   BUN 6 - 20 mg/dL 20  16  12   $ Creatinine 0.61 - 1.24 mg/dL 0.87  0.83  0.79   Sodium 135 - 145 mmol/L 137  136  136   Potassium 3.5 - 5.1 mmol/L 4.3  4.2  3.8   Chloride 98 - 111 mmol/L 98  99  98   CO2 22 - 32 mmol/L 32  28  29   Calcium 8.9 - 10.3 mg/dL 9.9  9.8  9.4   Total Protein 6.5 - 8.1 g/dL 7.9  7.6  7.7   Total Bilirubin 0.3 - 1.2 mg/dL 0.4  0.5  0.3   Alkaline Phos 38 - 126 U/L 122  113  98   AST 15 - 41 U/L 16  15  25   $ ALT 0 - 44 U/L 24  20  33    Component     Latest Ref Rng 04/26/2022  LDH     98 - 192 U/L 141   hCG Quant     0 - 3 mIU/mL <1   AFP, Serum, Tumor Marker     0.0 - 8.4 ng/mL 3.3       Pathology:   11/25/17 Tissue Flow Cytometry:       RADIOGRAPHIC STUDIES: I have personally reviewed the radiological images as listed and agreed with the findings in the report. No results  found.  ASSESSMENT & PLAN:   62 y.o. male with  1. 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 Prostate enlargement.  11/17/17 PET/CT revealed Isolated hypermetabolic retroperitoneal lymphadenopathy. Findings suspicious for lymphoma as no primary lesion is identified to suggest this is metastatic adenopathy. Recommend retroperitoneal biopsy. The largest left-sided node appears necrotic and is actually decreased in size since the prior CT scan. It appeared inflamed on the prior study and may have infarcted. I would not recommend biopsying this lesion. No adenopathy in the neck, axilla, chest, pelvis or inguinal regions. No worrisome pulmonary lesions or osseous lesions.  11/25/17 pathology results revealed  finding of metastatic seminoma    12/04/17 US Scrotum w/doppler revealed Negative scrotal ultrasound with no distinct testicular mass Identified. 2. 5 mm simple left epididymal cyst, almost certainly benign and felt to be incidental in nature.   S/p 4 cycles of EP treatment, tolerated well overall  04/09/18 CT C/A/P revealed "no metastatic adenopathy in the chest. Abdomen/Pelvis: 1. Interval reduction in volume of LEFT periaortic metastatic lymph node. 2. No new adenopathy in the abdomen pelvis. 3. Benign-appearing splenic in LEFT renal cystic lesions."  11/26/2018 CT CAP revealed "1. Stable mild left abdominal retroperitoneal lymphadenopathy. 2. No new or progressive metastatic disease within the chest, abdomen, or pelvis. 3. Stable mildly enlarged prostate."  2. Chemotherapy related neutropenia -- resolved.  PLAN:   -Discussed lab results from 04/26/22 with patient. CBC showed WBC slightly elevated at 11 K, hemoglobin of 16.7, and platelets of 287 K.   -no clinical evidence of new tumors -normal tumor markers -CMP normal -AFP,  LDH and hCG normal -CT chest/abdm/pelvis scan revealed stable results with no progressive changes of his extragonadal ferm cell tumor. -Lymph nodes unchanged since 2021 -Patient currently has no lab or clinical findings suggestive of recurrence/progression of his retroperitoneal seminoma. -no repeat scan needed at this time unless there is a change in labs or symptoms.   FOLLOW UP:  RTC with Dr Irene Limbo in 6 months Labs 1 week prior to clinic visit   The total time spent in the appointment was 20 minutes* .  All of the patient's questions were answered with apparent satisfaction. The patient knows to call the clinic with any problems, questions or concerns.   Sullivan Lone MD MS AAHIVMS [facility] Hematology/Oncology Physician [facility]  .*Total Encounter Time as defined by the Centers for Medicare and Medicaid Services includes, in addition to the face-to-face time of a patient visit (documented in the note above) non-face-to-face time: obtaining and reviewing outside history, ordering and reviewing medications, tests or procedures, care coordination (communications with other health care professionals or caregivers) and documentation in the medical record.  I,Mitra Faeizi,acting as a Education administrator for Sullivan Lone, MD.,have documented all relevant documentation on the behalf of Sullivan Lone, MD,as directed by  Sullivan Lone, MD while in the presence of Sullivan Lone, MD.  .I have reviewed the above documentation for accuracy and completeness, and I agree with the above. Brunetta Genera MD

## 2022-06-03 ENCOUNTER — Telehealth: Payer: Self-pay | Admitting: Hematology

## 2022-06-03 NOTE — Telephone Encounter (Signed)
Called patient again to confirm new appointments. Patient notified.

## 2022-06-03 NOTE — Telephone Encounter (Signed)
Called patient per 2/16 los notes to schedule f/u. Unable to leave voicemail due to mail box being full on cell phone and no voicemail on home phone. Will attempt to call again before EOD.

## 2022-11-21 ENCOUNTER — Other Ambulatory Visit: Payer: Self-pay

## 2022-11-21 DIAGNOSIS — C6290 Malignant neoplasm of unspecified testis, unspecified whether descended or undescended: Secondary | ICD-10-CM

## 2022-11-22 ENCOUNTER — Inpatient Hospital Stay: Payer: 59 | Attending: Hematology

## 2022-11-22 ENCOUNTER — Other Ambulatory Visit: Payer: Self-pay

## 2022-11-22 DIAGNOSIS — E119 Type 2 diabetes mellitus without complications: Secondary | ICD-10-CM | POA: Insufficient documentation

## 2022-11-22 DIAGNOSIS — Z7984 Long term (current) use of oral hypoglycemic drugs: Secondary | ICD-10-CM | POA: Diagnosis not present

## 2022-11-22 DIAGNOSIS — Z7982 Long term (current) use of aspirin: Secondary | ICD-10-CM | POA: Insufficient documentation

## 2022-11-22 DIAGNOSIS — Z79899 Other long term (current) drug therapy: Secondary | ICD-10-CM | POA: Diagnosis not present

## 2022-11-22 DIAGNOSIS — E669 Obesity, unspecified: Secondary | ICD-10-CM | POA: Diagnosis not present

## 2022-11-22 DIAGNOSIS — C6290 Malignant neoplasm of unspecified testis, unspecified whether descended or undescended: Secondary | ICD-10-CM | POA: Insufficient documentation

## 2022-11-22 DIAGNOSIS — I10 Essential (primary) hypertension: Secondary | ICD-10-CM | POA: Insufficient documentation

## 2022-11-22 DIAGNOSIS — C7951 Secondary malignant neoplasm of bone: Secondary | ICD-10-CM | POA: Insufficient documentation

## 2022-11-22 DIAGNOSIS — N400 Enlarged prostate without lower urinary tract symptoms: Secondary | ICD-10-CM | POA: Diagnosis not present

## 2022-11-22 LAB — LACTATE DEHYDROGENASE: LDH: 131 U/L (ref 98–192)

## 2022-11-22 LAB — CBC WITH DIFFERENTIAL (CANCER CENTER ONLY)
Abs Immature Granulocytes: 0.02 10*3/uL (ref 0.00–0.07)
Basophils Absolute: 0 10*3/uL (ref 0.0–0.1)
Basophils Relative: 0 %
Eosinophils Absolute: 0.2 10*3/uL (ref 0.0–0.5)
Eosinophils Relative: 3 %
HCT: 48.4 % (ref 39.0–52.0)
Hemoglobin: 16 g/dL (ref 13.0–17.0)
Immature Granulocytes: 0 %
Lymphocytes Relative: 30 %
Lymphs Abs: 2.4 10*3/uL (ref 0.7–4.0)
MCH: 28.1 pg (ref 26.0–34.0)
MCHC: 33.1 g/dL (ref 30.0–36.0)
MCV: 84.9 fL (ref 80.0–100.0)
Monocytes Absolute: 0.7 10*3/uL (ref 0.1–1.0)
Monocytes Relative: 9 %
Neutro Abs: 4.6 10*3/uL (ref 1.7–7.7)
Neutrophils Relative %: 58 %
Platelet Count: 243 10*3/uL (ref 150–400)
RBC: 5.7 MIL/uL (ref 4.22–5.81)
RDW: 13.2 % (ref 11.5–15.5)
WBC Count: 7.9 10*3/uL (ref 4.0–10.5)
nRBC: 0 % (ref 0.0–0.2)

## 2022-11-22 LAB — CMP (CANCER CENTER ONLY)
ALT: 19 U/L (ref 0–44)
AST: 14 U/L — ABNORMAL LOW (ref 15–41)
Albumin: 4.5 g/dL (ref 3.5–5.0)
Alkaline Phosphatase: 99 U/L (ref 38–126)
Anion gap: 7 (ref 5–15)
BUN: 17 mg/dL (ref 6–20)
CO2: 29 mmol/L (ref 22–32)
Calcium: 9.2 mg/dL (ref 8.9–10.3)
Chloride: 101 mmol/L (ref 98–111)
Creatinine: 0.72 mg/dL (ref 0.61–1.24)
GFR, Estimated: >60 mL/min (ref >60)
Glucose, Bld: 103 mg/dL — ABNORMAL HIGH (ref 70–99)
Potassium: 4 mmol/L (ref 3.5–5.1)
Sodium: 137 mmol/L (ref 135–145)
Total Bilirubin: 0.5 mg/dL (ref 0.3–1.2)
Total Protein: 7.2 g/dL (ref 6.5–8.1)

## 2022-11-29 ENCOUNTER — Other Ambulatory Visit: Payer: Self-pay

## 2022-11-29 ENCOUNTER — Inpatient Hospital Stay (HOSPITAL_BASED_OUTPATIENT_CLINIC_OR_DEPARTMENT_OTHER): Payer: 59 | Admitting: Hematology

## 2022-11-29 VITALS — BP 132/81 | HR 89 | Temp 98.4°F | Resp 17 | Ht 72.0 in | Wt 265.0 lb

## 2022-11-29 DIAGNOSIS — C6290 Malignant neoplasm of unspecified testis, unspecified whether descended or undescended: Secondary | ICD-10-CM

## 2022-11-29 NOTE — Progress Notes (Signed)
HEMATOLOGY/ONCOLOGY CLNIC NOTE  Date of Service: 11/29/2022   Patient Care Team: [name], MD as PCP - General (Family Medicine)  CHIEF COMPLAINTS/PURPOSE OF CONSULTATION:  Continued follow-up for surveillance of extranodal retroperitoneal seminoma  HISTORY OF PRESENTING ILLNESS:   Please see previous note for details on initial presentation.   Oncology History  Seminoma ([facility])  12/10/2017 Initial Diagnosis   Seminoma ([facility])   12/22/2017 - 03/02/2018 Chemotherapy   The patient had dexamethasone (DECADRON) 4 MG tablet, 1 of 1 cycle, Start date: 12/10/2017, End date: 05/05/2019 palonosetron (ALOXI) injection 0.25 mg, 0.25 mg, Intravenous,  Once, 4 of 4 cycles Administration: 0.25 mg (12/22/2017), 0.25 mg (12/24/2017), 0.25 mg (12/26/2017), 0.25 mg (01/12/2018), 0.25 mg (01/14/2018), 0.25 mg (01/16/2018), 0.25 mg (02/02/2018), 0.25 mg (02/04/2018), 0.25 mg (02/06/2018), 0.25 mg (02/23/2018), 0.25 mg (02/25/2018), 0.25 mg (02/27/2018) pegfilgrastim-cbqv (UDENYCA) injection 6 mg, 6 mg, Subcutaneous, Once, 4 of 4 cycles Administration: 6 mg (12/29/2017), 6 mg (01/19/2018), 6 mg (02/09/2018), 6 mg (03/02/2018) CISplatin (PLATINOL) 52 mg in sodium chloride 0.9 % 250 mL chemo infusion, 20 mg/m2 = 52 mg, Intravenous,  Once, 4 of 4 cycles Administration: 52 mg (12/22/2017), 52 mg (12/23/2017), 52 mg (12/24/2017), 52 mg (12/25/2017), 52 mg (12/26/2017), 52 mg (01/12/2018), 52 mg (01/13/2018), 52 mg (01/14/2018), 52 mg (01/15/2018), 52 mg (01/16/2018), 52 mg (02/02/2018), 52 mg (02/03/2018), 52 mg (02/04/2018), 52 mg (02/05/2018), 52 mg (02/06/2018), 52 mg (02/23/2018), 52 mg (02/24/2018), 52 mg (02/25/2018), 52 mg (02/26/2018), 52 mg (02/27/2018) etoposide (VEPESID) 260 mg in sodium chloride 0.9 % 1,000 mL chemo infusion, 100 mg/m2, Intravenous,  Once, 1 of 1 cycle Administration: 260 mg (12/22/2017) etoposide (VEPESID) 260 mg in sodium chloride 0.9 % 650 mL chemo infusion, 100 mg/m2 = 260 mg, Intravenous,  Once, 4 of  4 cycles Administration: 260 mg (12/23/2017), 260 mg (12/24/2017), 260 mg (12/25/2017), 260 mg (12/26/2017), 260 mg (01/12/2018), 260 mg (01/13/2018), 260 mg (01/14/2018), 260 mg (01/15/2018), 260 mg (01/16/2018), 260 mg (02/02/2018), 260 mg (02/03/2018), 260 mg (02/04/2018), 260 mg (02/05/2018), 260 mg (02/06/2018), 260 mg (02/23/2018), 260 mg (02/24/2018), 260 mg (02/25/2018), 260 mg (02/26/2018), 260 mg (02/27/2018) fosaprepitant (EMEND) 150 mg, dexamethasone (DECADRON) 12 mg in sodium chloride 0.9 % 145 mL IVPB, , Intravenous,  Once, 4 of 4 cycles Administration:  (12/22/2017),  (12/24/2017),  (12/26/2017),  (01/12/2018),  (01/14/2018),  (01/16/2018),  (02/02/2018),  (02/04/2018),  (02/06/2018),  (02/23/2018),  (02/25/2018),  (02/27/2018)  for chemotherapy treatment.      INTERVAL HISTORY:  Neil Berg is a 62 y.o. male is here for a scheduled follow-up for stage II extra-gonadal retroperitoneal Seminoma.   Patient was last seen by me on 05/31/2022 and he was doing well overall.   Patient notes he has been doing well overall without any new or severe medical concerns since our last visit. He denies any recent infection issues, fever, chills, SOB, unexpected weight loss, abdominal pain, testicular pain, testicular swelling, chest pain, back pain, headaches, dizziness, change in vision, or leg swelling. He does complain of getting tired easily, but notes it does not affect his daily activities.    MEDICAL HISTORY:  Past Medical History:  Diagnosis Date   Chest pain    Diabetes mellitus without complication ([facility])    Hypertension    Obesity   Type II Diabetes Mellitus   SURGICAL HISTORY: Past Surgical History:  Procedure Laterality Date   EYE SURGERY     left eye cataract removal / implant    IR IMAGING GUIDED PORT  INSERTION  12/11/2017   IR REMOVAL TUN ACCESS W/ PORT W/O FL MOD SED  05/01/2018   TONSILLECTOMY      Tonsillectomy 1974 Cataract surgery-left eye 04/2015 Colonoscopy  06/04/16  SOCIAL HISTORY: Social History   Socioeconomic History   Marital status: Married    Spouse name: Not on file   Number of children: Not on file   Years of education: Not on file   Highest education level: Not on file  Occupational History   Not on file  Tobacco Use   Smoking status: Never   Smokeless tobacco: Never  Vaping Use   Vaping status: Never Used  Substance and Sexual Activity   Alcohol use: No   Drug use: No   Sexual activity: Not on file  Other Topics Concern   Not on file  Social History Narrative   Not on file   Social Determinants of Health   Financial Resource Strain: Not on file  Food Insecurity: Not on file  Transportation Needs: Not on file  Physical Activity: Not on file  Stress: Not on file  Social Connections: Not on file  Intimate Partner Violence: Not on file    FAMILY HISTORY: Family History  Problem Relation Age of Onset   Heart disease Maternal Grandmother    Heart attack Maternal Grandfather     ALLERGIES:  is allergic to crestor [rosuvastatin] and metformin.  MEDICATIONS:  Current Outpatient Medications  Medication Sig Dispense Refill   amLODipine (NORVASC) 10 MG tablet Take 10 mg by mouth daily.     amLODipine (NORVASC) 5 MG tablet Take 1 tablet (5 mg total) by mouth daily. 30 tablet 11   aspirin 325 MG tablet 1 table     aspirin EC 81 MG tablet Take 81 mg by mouth daily.     atorvastatin (LIPITOR) 40 MG tablet Take 40 mg by mouth daily.     b complex vitamins tablet Take 1 tablet by mouth daily.     empagliflozin (JARDIANCE) 10 MG TABS tablet 1 tablet     JARDIANCE 25 MG TABS tablet TK 1 T PO QAM  0   lisinopril (PRINIVIL,ZESTRIL) 20 MG tablet TAKE 1 TABLET BY MOUTH EVERY DAY. DISCONTINUE LISINOPRIL/HCTZ COMBINATION 30 tablet 0   No current facility-administered medications for this visit.    REVIEW OF SYSTEMS:    10 Point review of Systems was done is negative except as noted above.  PHYSICAL EXAMINATION: ECOG  PERFORMANCE STATUS: 1 - Symptomatic but completely ambulatory  .BP 132/81 (BP Location: Left Arm, Patient Position: Sitting)   Pulse 89   Temp 98.4 F (36.9 C) (Oral)   Resp 17   Ht 6' (1.829 m)   Wt 265 lb (120.2 kg)   SpO2 99%   BMI 35.94 kg/m   GENERAL:alert, in no acute distress and comfortable SKIN: no acute rashes, no significant lesions EYES: conjunctiva are pink and non-injected, sclera anicteric OROPHARYNX: MMM, no exudates, no oropharyngeal erythema or ulceration NECK: supple, no JVD LYMPH:  no palpable lymphadenopathy in the cervical, axillary or inguinal regions LUNGS: clear to auscultation b/l with normal respiratory effort HEART: regular rate & rhythm ABDOMEN:  normoactive bowel sounds , non tender, not distended. Extremity: no pedal edema PSYCH: alert & oriented x 3 with fluent speech NEURO: no focal motor/sensory deficits    LABORATORY DATA:  I have reviewed the data as listed  .    Latest Ref Rng & Units 11/22/2022   12:30 PM 04/26/2022   11:02 AM  10/05/2021   12:19 PM  CBC  WBC 4.0 - 10.5 K/uL 7.9  11.0  10.7   Hemoglobin 13.0 - 17.0 g/dL 40.9  81.1  91.4   Hematocrit 39.0 - 52.0 % 48.4  48.4  48.7   Platelets 150 - 400 K/uL 243  287  263   ANC 300  .    Latest Ref Rng & Units 11/22/2022   12:30 PM 04/26/2022   11:02 AM 10/05/2021   12:19 PM  CMP  Glucose 70 - 99 mg/dL 782  956  213   BUN 6 - 20 mg/dL 17  20  16    Creatinine 0.61 - 1.24 mg/dL 0.86  5.78  4.69   Sodium 135 - 145 mmol/L 137  137  136   Potassium 3.5 - 5.1 mmol/L 4.0  4.3  4.2   Chloride 98 - 111 mmol/L 101  98  99   CO2 22 - 32 mmol/L 29  32  28   Calcium 8.9 - 10.3 mg/dL 9.2  9.9  9.8   Total Protein 6.5 - 8.1 g/dL 7.2  7.9  7.6   Total Bilirubin 0.3 - 1.2 mg/dL 0.5  0.4  0.5   Alkaline Phos 38 - 126 U/L 99  122  113   AST 15 - 41 U/L 14  16  15    ALT 0 - 44 U/L 19  24  20     Component     Latest Ref Rng 04/26/2022  LDH     98 - 192 U/L 141   hCG Quant     0 - 3 mIU/mL <1    AFP, Serum, Tumor Marker     0.0 - 8.4 ng/mL 3.3       Pathology:   11/25/17 Tissue Flow Cytometry:       RADIOGRAPHIC STUDIES: I have personally reviewed the radiological images as listed and agreed with the findings in the report. No results found.  ASSESSMENT & PLAN:   62 y.o. male with  1. 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 Prostate enlargement.  11/17/17 PET/CT revealed Isolated hypermetabolic retroperitoneal lymphadenopathy. Findings suspicious for lymphoma as no primary lesion is identified to suggest this is metastatic adenopathy. Recommend retroperitoneal biopsy. The largest left-sided node appears necrotic and is actually decreased in size since the prior CT scan. It appeared inflamed on the prior study and may have infarcted. I would not recommend biopsying this lesion. No adenopathy in the neck, axilla, chest, pelvis or inguinal regions. No worrisome pulmonary lesions or osseous lesions.  11/25/17 pathology results revealed  finding of metastatic seminoma    12/04/17 US Scrotum w/doppler revealed Negative scrotal ultrasound with no distinct testicular mass Identified. 2. 5 mm simple left epididymal cyst, almost certainly benign and felt to be incidental in nature.   S/p 4 cycles of EP treatment, tolerated well overall  04/09/18 CT C/A/P revealed "no metastatic adenopathy in the chest. Abdomen/Pelvis: 1. Interval reduction in volume of LEFT periaortic metastatic lymph node. 2. No new adenopathy in the abdomen pelvis. 3. Benign-appearing splenic in LEFT renal cystic lesions."  11/26/2018 CT CAP revealed "1. Stable mild left abdominal retroperitoneal lymphadenopathy. 2. No new or progressive metastatic disease within the  chest, abdomen, or pelvis. 3. Stable mildly enlarged prostate."  2. Chemotherapy related neutropenia --  resolved.  PLAN:   -Discussed lab results from today, 11/22/2022, with the patient. CBC and CMP are stable. LDH in the normal range at 131. Beta hCG tumor market is negative. AFP serum tumor marker in the normal range at 2.1.  -Patient currently has no lab or clinical findings suggestive of recurrence/progression of his retroperitoneal seminoma. -no repeat scan needed at this time unless there is a change in labs or symptoms.   FOLLOW-UP: RTC with Dr Candise Che with labs in 12 months  The total time spent in the appointment was 20 minutes* .  All of the patient's questions were answered with apparent satisfaction. The patient knows to call the clinic with any problems, questions or concerns.   Wyvonnia Lora MD MS AAHIVMS [facility] Hematology/Oncology Physician [facility]  .*Total Encounter Time as defined by the Centers for Medicare and Medicaid Services includes, in addition to the face-to-face time of a patient visit (documented in the note above) non-face-to-face time: obtaining and reviewing outside history, ordering and reviewing medications, tests or procedures, care coordination (communications with other health care professionals or caregivers) and documentation in the medical record.   I,Param Shah,acting as a Neurosurgeon for Wyvonnia Lora, MD.,have documented all relevant documentation on the behalf of Wyvonnia Lora, MD,as directed by  Wyvonnia Lora, MD while in the presence of Wyvonnia Lora, MD.   .I have reviewed the above documentation for accuracy and completeness, and I agree with the above. Johney Maine MD

## 2022-12-27 ENCOUNTER — Telehealth: Payer: Self-pay | Admitting: Hematology

## 2022-12-27 NOTE — Telephone Encounter (Signed)
Unable to leave message for patient to call back to schedule follow-up appointment per 8/16 LOS due to mailbox full.
